# Patient Record
Sex: Female | Born: 1935 | Race: Black or African American | Hispanic: No | Smoking: Never smoker
Health system: Southern US, Community
[De-identification: ages and names within clinical notes are randomized; demographics above are authoritative.]

## PROBLEM LIST (undated history)

## (undated) DIAGNOSIS — E7800 Pure hypercholesterolemia, unspecified: Secondary | ICD-10-CM

## (undated) DIAGNOSIS — E119 Type 2 diabetes mellitus without complications: Secondary | ICD-10-CM

## (undated) DIAGNOSIS — Z87898 Personal history of other specified conditions: Secondary | ICD-10-CM

## (undated) DIAGNOSIS — I10 Essential (primary) hypertension: Secondary | ICD-10-CM

---

## 1995-06-22 HISTORY — PX: BREAST CYST EXCISION: SHX579

## 1995-06-22 HISTORY — PX: ABDOMINAL HYSTERECTOMY: SHX81

## 1997-12-15 ENCOUNTER — Emergency Department (HOSPITAL_COMMUNITY): Admission: EM | Admit: 1997-12-15 | Discharge: 1997-12-15 | Payer: Self-pay | Admitting: Emergency Medicine

## 1999-09-28 ENCOUNTER — Ambulatory Visit (HOSPITAL_COMMUNITY): Admission: RE | Admit: 1999-09-28 | Discharge: 1999-09-28 | Payer: Self-pay | Admitting: Gastroenterology

## 1999-09-28 ENCOUNTER — Encounter: Payer: Self-pay | Admitting: Gastroenterology

## 2001-05-17 ENCOUNTER — Encounter: Payer: Self-pay | Admitting: Emergency Medicine

## 2001-05-17 ENCOUNTER — Emergency Department (HOSPITAL_COMMUNITY): Admission: EM | Admit: 2001-05-17 | Discharge: 2001-05-17 | Payer: Self-pay | Admitting: Emergency Medicine

## 2001-10-05 ENCOUNTER — Ambulatory Visit (HOSPITAL_BASED_OUTPATIENT_CLINIC_OR_DEPARTMENT_OTHER): Admission: RE | Admit: 2001-10-05 | Discharge: 2001-10-05 | Payer: Self-pay | Admitting: *Deleted

## 2001-10-05 ENCOUNTER — Encounter (INDEPENDENT_AMBULATORY_CARE_PROVIDER_SITE_OTHER): Payer: Self-pay | Admitting: Specialist

## 2002-03-01 ENCOUNTER — Ambulatory Visit (HOSPITAL_BASED_OUTPATIENT_CLINIC_OR_DEPARTMENT_OTHER): Admission: RE | Admit: 2002-03-01 | Discharge: 2002-03-01 | Payer: Self-pay | Admitting: Internal Medicine

## 2003-10-25 ENCOUNTER — Emergency Department (HOSPITAL_COMMUNITY): Admission: EM | Admit: 2003-10-25 | Discharge: 2003-10-25 | Payer: Self-pay | Admitting: Family Medicine

## 2004-06-21 DIAGNOSIS — Z87898 Personal history of other specified conditions: Secondary | ICD-10-CM

## 2004-06-21 HISTORY — DX: Personal history of other specified conditions: Z87.898

## 2004-11-17 ENCOUNTER — Emergency Department (HOSPITAL_COMMUNITY): Admission: EM | Admit: 2004-11-17 | Discharge: 2004-11-17 | Payer: Self-pay | Admitting: Emergency Medicine

## 2005-03-22 ENCOUNTER — Inpatient Hospital Stay (HOSPITAL_COMMUNITY): Admission: EM | Admit: 2005-03-22 | Discharge: 2005-03-23 | Payer: Self-pay | Admitting: Emergency Medicine

## 2005-05-27 ENCOUNTER — Emergency Department (HOSPITAL_COMMUNITY): Admission: EM | Admit: 2005-05-27 | Discharge: 2005-05-27 | Payer: Self-pay | Admitting: Emergency Medicine

## 2006-03-08 ENCOUNTER — Emergency Department (HOSPITAL_COMMUNITY): Admission: EM | Admit: 2006-03-08 | Discharge: 2006-03-08 | Payer: Self-pay | Admitting: Family Medicine

## 2006-03-08 IMAGING — CR DG KNEE COMPLETE 4+V*L*
4 series · 4 of 4 positions shown · non-contrast
Comparison: none

CLINICAL DATA: 72-year-old who fell off ladder.  Pain and swelling of knee.  Pain with ambulation laterally.  
 LEFT KNEE ? 3 VIEW:

[view not recorded (1 of 4)]
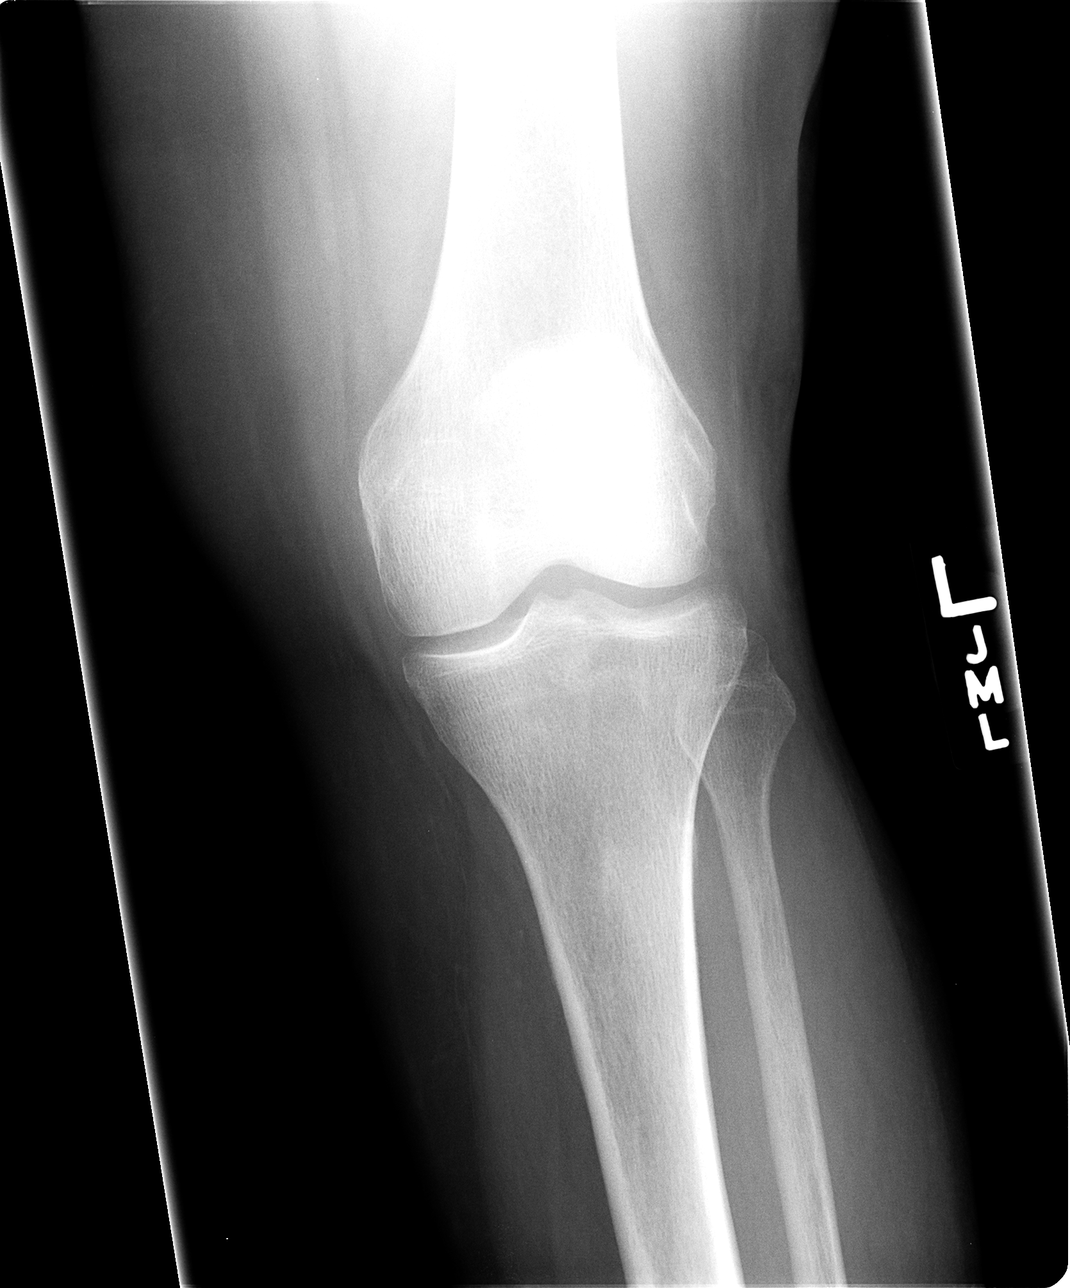

[view not recorded (2 of 4)]
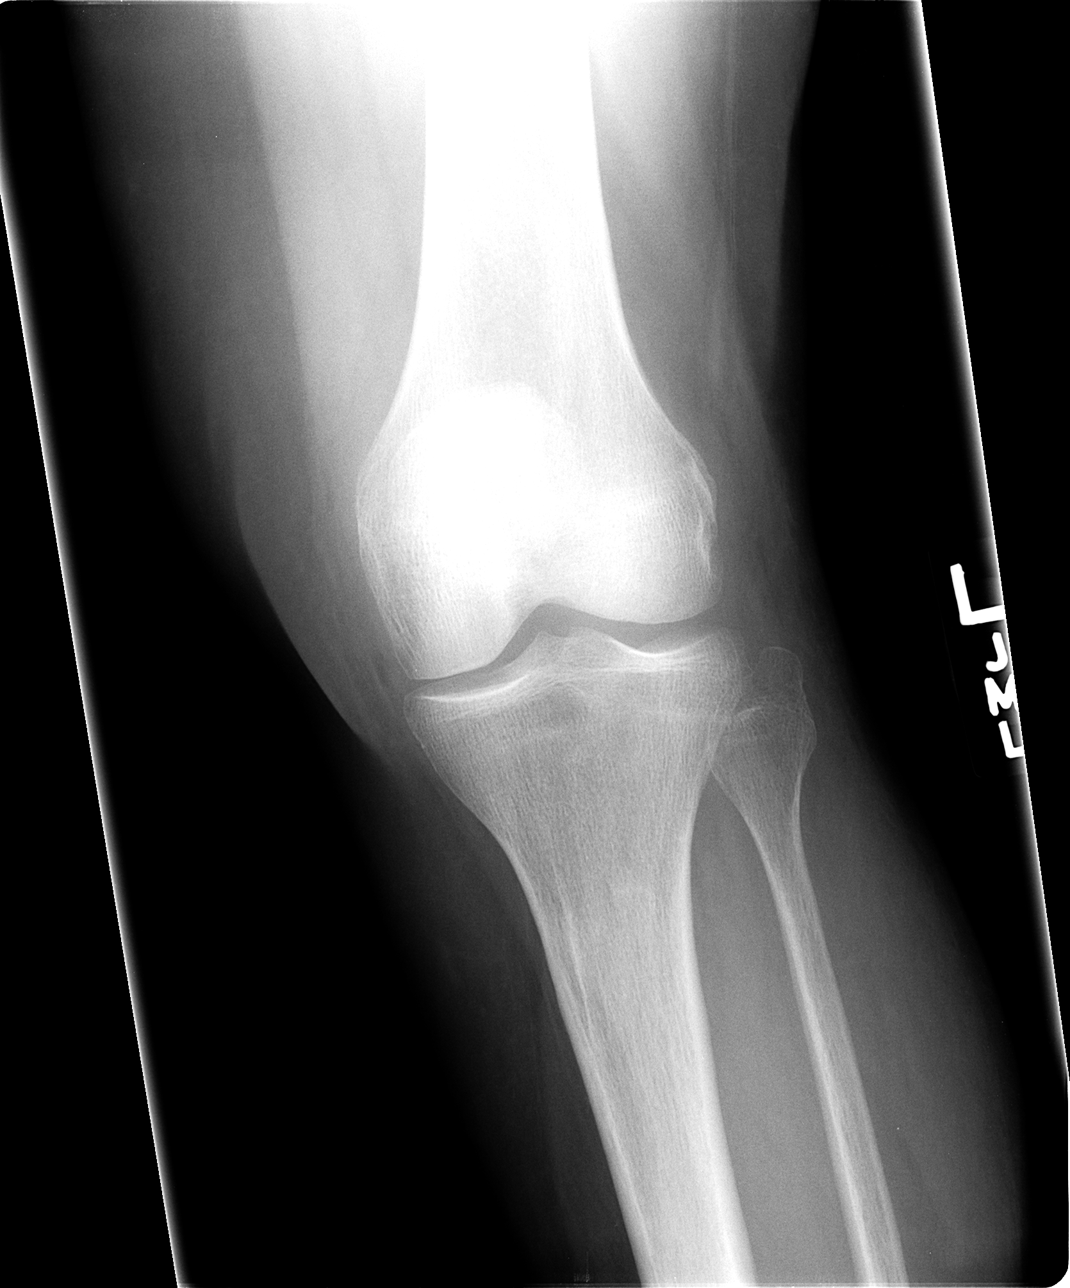

[view not recorded (3 of 4)]
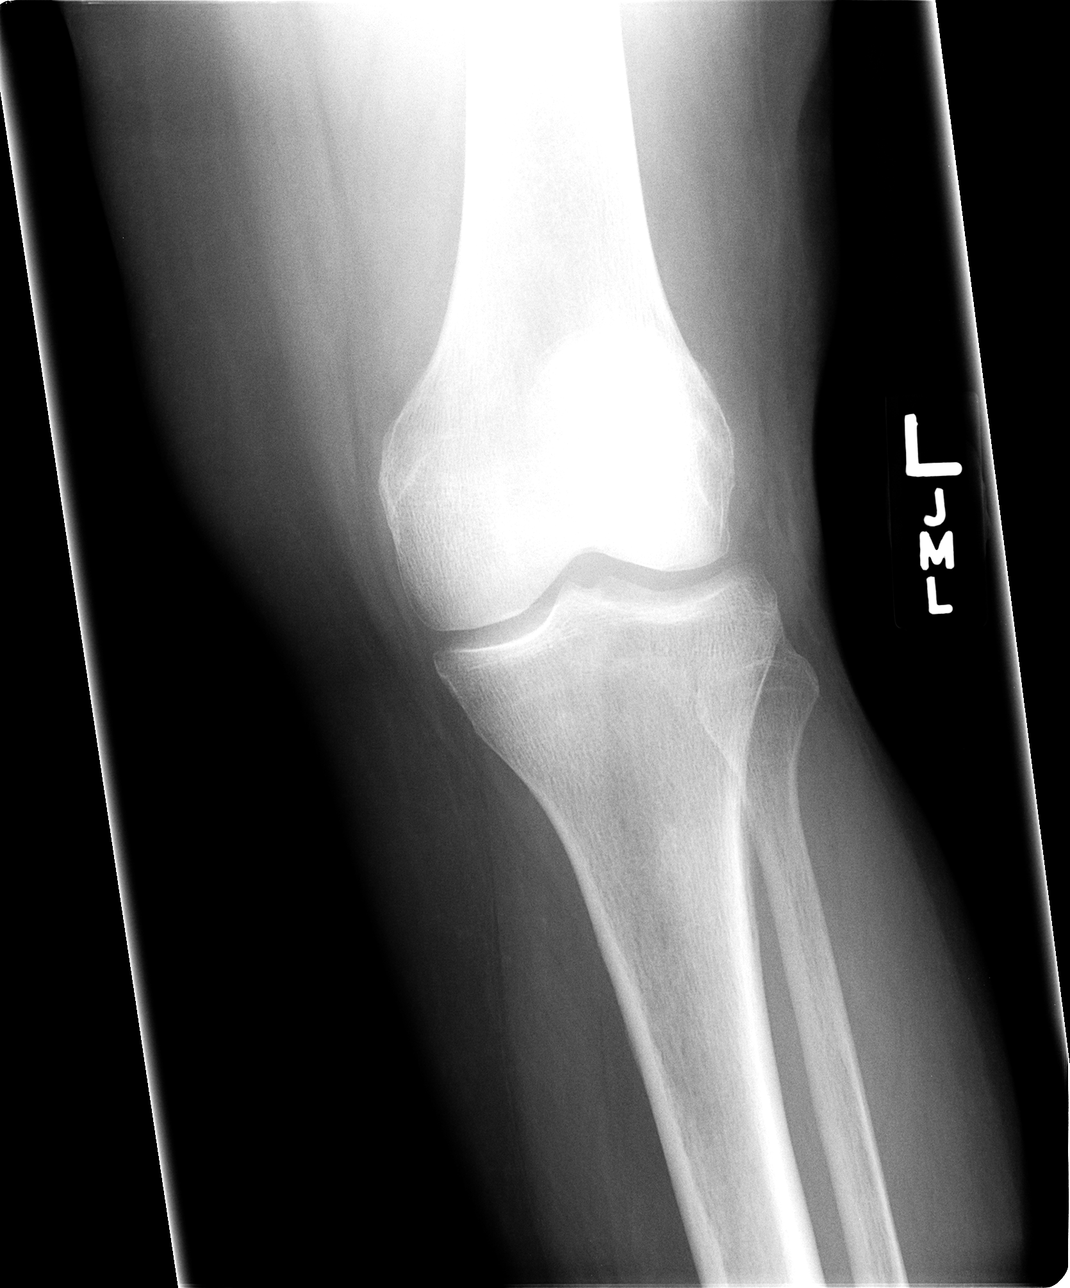

[view not recorded (4 of 4)]
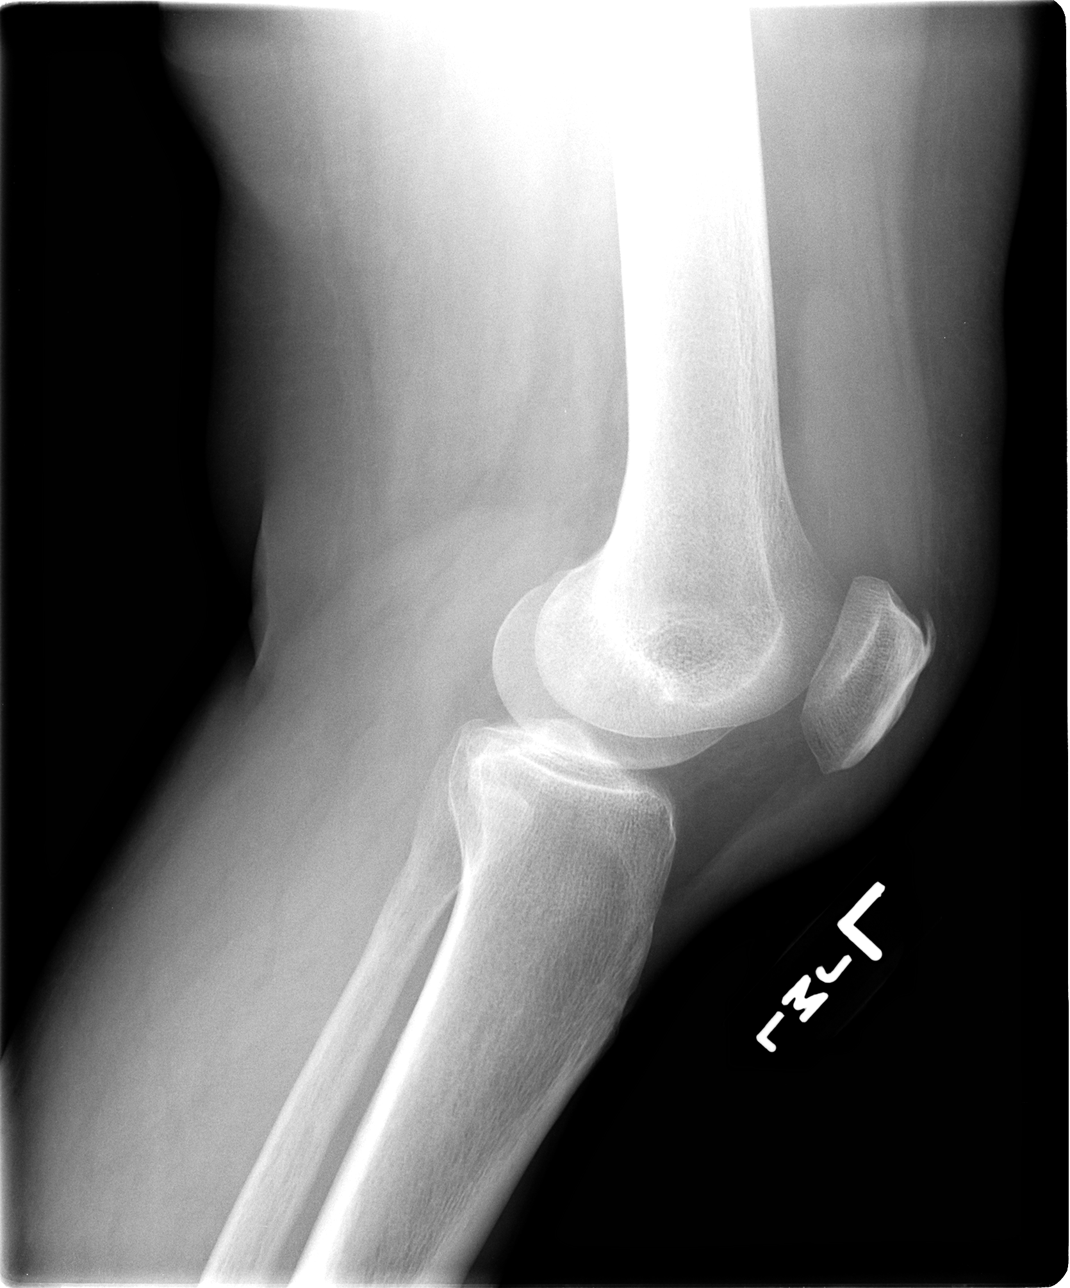

[4 of 4 positions shown; findings below may reference images not displayed]

FINDINGS: Note is made of joint effusion.  There is degenerative change involving the patellofemoral joint and mild narrowing of the medial compartment.  However there is no evidence for acute fracture/dislocation.
IMPRESSION: 1.  Joint effusion without evidence for acute fracture. 
 2.  Mild degenerative changes.  Consider MRI if there is persistent pain.

## 2007-09-25 ENCOUNTER — Emergency Department (HOSPITAL_COMMUNITY): Admission: EM | Admit: 2007-09-25 | Discharge: 2007-09-25 | Payer: Self-pay | Admitting: Emergency Medicine

## 2009-04-09 ENCOUNTER — Ambulatory Visit: Payer: Self-pay | Admitting: Psychology

## 2009-07-18 ENCOUNTER — Ambulatory Visit: Payer: Self-pay | Admitting: Psychology

## 2009-07-27 ENCOUNTER — Emergency Department (HOSPITAL_COMMUNITY): Admission: EM | Admit: 2009-07-27 | Discharge: 2009-07-27 | Payer: Self-pay | Admitting: Family Medicine

## 2010-11-06 NOTE — Discharge Summary (Signed)
NAMEVONITA, Madeline Burke                ACCOUNT NO.:  [id number]   MEDICAL RECORD NO.:  [id number]          PATIENT TYPE:  INP   LOCATION:  2913                         FACILITY:  [facility]   PHYSICIAN:  Hillery Aldo, M.D.   DATE OF BIRTH:  12-08-1933   DATE OF ADMISSION:  03/22/2005  DATE OF DISCHARGE:  03/23/2005                                 DISCHARGE SUMMARY   PRIMARY CARE PHYSICIAN:  Dr. Dorothyann Peng.   ALLERGIST/IMMUNOLOGIST:  [facility] Department of Allergy and Immunology.   DISCHARGE DIAGNOSES:  1.  Recurrent angioedema, trigger unknown.  2.  Hypertension.  3.  Diabetes.  4.  Allergies to sulfa, penicillin, and aspirin.  5.  Hyperlipidemia.   DISCHARGE MEDICATIONS:  1.  Avandia 8 mg daily.  2.  Glipizide 10 mg twice daily.  3.  Crestor 5 mg daily.  4.  Zyrtec 10 mg daily.  5.  Hydrochlorothiazide 25 mg daily.  6.  Prednisone taper 60 mg to off over 5 days.  7.  Metformin 1000 mg twice daily.  8.  Multivitamin daily.   CONSULTATIONS:  Dr. Antony Contras of ENT.   PROCEDURES AND DIAGNOSTIC STUDIES:  None.   DISCHARGE LABORATORY VALUES:  Hemoglobin was 10.5, hematocrit 32.0, white  blood cell count 12.0, platelets 280,000.  Sodium was 134, potassium 3.8,  chloride 103, bicarb 22, BUN 24, creatinine 1.4, glucose 277.   BRIEF ADMISSION HISTORY OF PRESENT ILLNESS:  The patient is a 77 year old  female who came into the emergency department with recurrent angioedema.  The patient reports at least having had 4 episodes in the past, with the  last one occurring in June.  She had seen an allergist/immunologist [facility], but does not recall the physician's  name who treated her.  Apparently, all testing was negative and she has been  treated with Alavert, Zyrtec, and Elavil in the past.  Currently, she  remains only on Zyrtec.  Approximately 1 week prior to her admission, she  developed symptoms of upper respiratory infection and was put  on Levaquin.  She has been taking this for 2 days prior to admission.  She developed a  sensation of a lump in her throat on March 21, 2005 at midnight and was  brought to the emergency department, where she was found to have palatal  angioedema.  She was admitted for further observation and treatment.   HOSPITAL COURSE:  PROBLEM #1 - RECURRENT ANGIOEDEMA:  Unclear what the  trigger is, but Levaquin is strongly suspected.  She was taken off this  medication.  Additionally, her Hyzaar was held.  She was given Solu-Medrol  60 mg IV q.6 h. x24 hours and then put on a prednisone taper, with which she  will go home.  She had no recurrent episodes of angioedema while  hospitalized.  She was evaluated by Dr. Jenne Pane of ENT, who did a laryngoscope  and found edema.  When he followed up with her later on that afternoon, her  airway was clear with no evidence of stridor and decreasing palatal edema.  Overnight, she reported no exacerbation or ongoing problems with swelling,  difficulty breathing or cough.  She was deemed stable for discharge on the  afternoon of March 23, 2005.  The patient has an EpiPen and understands  when to use this.  She did not use it when she experienced her latest  episode.  She states that she has a scheduled followup appointment at the  Department of Allergy and Immunology at [facility].   PROBLEM #2 - HYPERTENSION:  The patient's blood pressures remained fairly  stable off of her Hyzaar.  She will be discharged only on  hydrochlorothiazide with instructions to follow up with her primary care  physician to see if her primary care physician wishes to rechallenge her  with Hyzaar or to attempt to find a different medicine.   PROBLEM #3 - DIABETES:  The patient did have elevated glucoses secondary to  steroids.  She was covered with sliding-scale insulin while hospitalized.  It is expected that her glycemic control will improve once she is completed  with her prednisone  taper.   PROBLEM #4 - HYPERLIPIDEMIA:  The patient will be instructed to resume her  Crestor.   PROBLEM #5 - DISPOSITION:  The patient will be discharged home at  approximately noon this afternoon if she experiences no further problems  with angioedema or palatal swelling.   DISCHARGE INSTRUCTIONS:  The patient is instructed to follow up with Dr.  Allyne Gee in 1-2 weeks for a blood pressure check and to determine if  resumption of Hyzaar is warranted versus considering an alternative  antihypertensive regimen.  Additionally, she is to follow up [facility]  Department of Allergy and Immunology as previously scheduled.  She is to  resume a diabetic diet and to increase her activity slowly.  She was  instructed to her EpiPen with her at all times and to self-administer it  should she have any recurrent angioedema.  She is to return to the emergency  department immediately for any problems with throat swelling.           ______________________________  Hillery Aldo, M.D.     CR/MEDQ  D:  03/23/2005  T:  03/23/2005  Job:  [id number]   cc:   Candyce Churn. Allyne Gee, M.D.  Fax: [phone]

## 2010-11-06 NOTE — H&P (Signed)
NAMELABRESHA, MELLOR                ACCOUNT NO.:  [id number]   MEDICAL RECORD NO.:  [id number]          PATIENT TYPE:  INP   LOCATION:  1843                         FACILITY:  [facility]   PHYSICIAN:  Isidor Holts, M.D.  DATE OF BIRTH:  Jun 09, 1934   DATE OF ADMISSION:  03/22/2005  DATE OF DISCHARGE:                                HISTORY & PHYSICAL   PRIMARY MEDICAL DOCTOR:  Candyce Churn. Allyne Gee, M.D.   CHIEF COMPLAINT:  Swelling in the mouth.   HISTORY OF PRESENT ILLNESS:  This is a 77 year old female, with known  history of recurrent angioedema, at least 4 episodes in 2006, the last one  in June 2006.  She has been worked up by allergist/immunologist, and all  tests are reportedly negative.  She has in the past been treated with  Alavert, Zyrtec, and Elavil, but has now been taken off all these  medications with the exception of Zyrtec.  About one week ago, she developed  upper respiratory tract symptoms, i.e., runny nose, cough, and became  occasionally short of breath since about 4 days ago.  She has taken Levaquin  for this, in the last 2 days.  On March 21, 2005, the patient awoke from  sleep with a feeling of a lump in the throat.  She called her daughter,  who brought her to the emergency department, where she was found to have  palatal angioedema.   PAST MEDICAL HISTORY:  1.  Diabetes mellitus type 2.  2.  Hypertension.  3.  Recurrent angioedema, 2006.  4.  Status post hysterectomy/status post removal of right breast cyst, 1997.   MEDICATIONS:  1.  Zyrtec 10 mg p.o. daily.  2.  Avandia 8 mg p.o. daily.  3.  Glipizide ER 10 mg b.i.d.  4.  Metformin 1 g b.i.d.  5.  Hyzaar (100/25) 1 p.o. daily.  6.  Centrum A to Z 1 p.o. daily.   ALLERGIES:  1.  SULFA.  2.  PENICILLIN.  3.  ASPIRIN.   SYSTEMS REVIEW:  As per HPI and chief complaint, otherwise negative.   FAMILY/SOCIAL HISTORY:  The patient lives alone.  Is a nonsmoker,  nondrinker.  Has no history of drug abuse.   She has three offspring.  Her  mother has diabetes mellitus, hypertension, and osteoarthritis.  Her father  died of complications of diabetes mellitus, status post CVA.   PHYSICAL EXAMINATION:  VITAL SIGNS:  Temperature 98.7; pulse 95 per minute,  regular; respiratory rate 22; BP 140/78 mmHg; pulse oximetry 100% on room  air.  GENERAL:  The patient appears alert, comfortable, not short of breath at  rest, although she sounds hoarse.  HEENT:  No clinical pallor, no jaundice, no conjunctival injection.  Throat  examination reveals mild palatal swelling.  NECK:  Supple.  JVD not seen.  There is no palpable lymphadenopathy, no  palpable goiter.  CHEST:  Clinically clear to auscultation.  No wheezes, no crackles.  HEART:  Sounds 1 and 2 heard.  Normal rate and rhythm.  No murmurs.  ABDOMEN:  Full, soft, and nontender.  There is  no palpable organomegaly, no  palpable masses, normal bowel sounds.  LOWER EXTREMITIES:  No pitting edema.  Palpable peripheral pulses.  MUSCULOSKELETAL:  No abnormalities noted.  CENTRAL NERVOUS SYSTEM:  No focal neurologic deficits on gross examination.   INVESTIGATIONS:  Pending.   ASSESSMENT AND PLAN:  1.  Recurrent angioedema/Dysphonia..  The patient has been on her current      medications for some time now.  The only recent addition is Levaquin.      The patient had a recent URI, without production of purulent phlegm.      This is likely viral in etiology.  We shall therefore discontinue      Levaquin.  We shall also discontinue Hyzaar, as this may be a likely      culprit.  Treat patient with antihistamines, steroids, and H2RA.  We      shall admit for observation to the step-down unit, or ICU, for close      observation, in case respiratory distress supervenes. ENT has been      consulted.   1.  Hypertension.  BP appears reasonable.  Will monitor for now.   1.  Diabetes mellitus.  Will restart pre-admission medications, when it is      clear that the  patient will be able to swallow.  Meanwhile, will monitor      CBGs regularly and cover with sliding scale insulin.  Further management will depend on clinical course.      Isidor Holts, M.D.  Electronically Signed     CO/MEDQ  D:  03/22/2005  T:  03/22/2005  Job:  [id number]   cc:   Candyce Churn. Allyne Gee, M.D.  Fax: [phone]

## 2010-11-06 NOTE — Op Note (Signed)
[place]  Patient:    Madeline Burke, Madeline Burke Visit Number: [id number] MRN: [id number]          Service Type: DSU Location: [facility] Attending Physician:  Vikki Ports. Dictated by:   Earna Coder, M.D. Proc. Date: 10/05/01 Admit Date:  10/05/2001   CC:         Tyson Dense, M.D.   Operative Report  PREOPERATIVE DIAGNOSIS:  Rule out polymyositis.  POSTOPERATIVE DIAGNOSIS:  Rule out polymyositis.  OPERATION PERFORMED:  Left quadriceps muscle biopsy.  SURGEON:  Stephenie Acres, M.D.  ANESTHESIA:  General.  DESCRIPTION OF PROCEDURE:  The patient was taken to the operating room and placed in supine position.  After adequate anesthesia was induced, the left thigh was prepped and draped in normal sterile fashion.  Using a vertical incision over the lower lateral thigh, I dissected down onto the fascia.  The fascia was opened vertically for about 2 cm and longitudinal muscle fibers were delivered up and through the fascia.  They were ligated on the ends, kept on stretch and tacked to a 2 cm strip of Q-Tip.  This was sent for pathological evaluation after discussion with Dr. Clelia Croft.  Adequate hemostasis was ensured and the fascia was closed with a running  3-0 Vicryl suture.  Skin was closed with a subcuticular 4-0 Monocryl.  Steri-Strips and sterile dressing were applied.  The patient tolerated the procedure well and went to PACU in good condition. Dictated by:   Earna Coder, M.D. Attending Physician:  Danna Hefty R. DD:  10/05/01 TD:  10/05/01 Job: [id number]

## 2010-11-06 NOTE — Consult Note (Signed)
NAMEARELIS, Burke NO.:  [id number]   MEDICAL RECORD NO.:  [id number]          PATIENT TYPE:  INP   LOCATION:  1843                         FACILITY:  [facility]   PHYSICIAN:  Antony Contras, MD     DATE OF BIRTH:  1933-09-12   DATE OF CONSULTATION:  03/22/2005  DATE OF DISCHARGE:                                   CONSULTATION   REFERRING SERVICE:  [facility].   CHIEF COMPLAINT:  Swelling in throat.   HISTORY OF PRESENT ILLNESS:  [name] is a 77 year old African  American female who presented to the emergency room last night with a  feeling of swelling in her throat.  Madeline Burke has a history of recurrent  angioedema and has been evaluated by allergists and immunologists in  [place] as well at [facility]; the etiology remains unknown.  She has recently been treated only with Zyrtec.  Last night, she began  experiencing a feeling of a knot in her throat and came to the emergency  department around midnight due to this sensation.  Here at the emergency  department, she has been without respiratory distress and upon evaluation by  emergency staff, she was noted to have swelling of her uvula and soft  palate; a consultation was obtained for that reason.  Currently, Mrs.  Madeline Burke is tired from being awake all night and also feels a swelling in the  back of the throat.  Her voice is somewhat hoarse and she states that she  has had laryngitis for the past 3 or 4 days.  She is having no trouble  breathing and has not tried to swallow anything through the night.   PAST MEDICAL HISTORY:  1.  Non-insulin-dependent diabetes.  2.  Hypertension.   SOCIAL HISTORY:  The patient denies alcohol or tobacco use.   MEDICATIONS:  1.  Glucophage.  2.  Avandia.  3.  Hyzaar.  4.  Glucotrol.   ALLERGIES:  PENICILLIN, SULFA, ASPIRIN.   FAMILY HISTORY:  Noncontributory.   REVIEW OF SYSTEMS:  Review of systems otherwise negative.   PHYSICAL  EXAMINATION:  VITAL SIGNS:  Temperature 98.7 degrees, blood  pressure 144/78, pulse of 75, respirations 22, SAT 94% to 100% on room air.  GENERAL:  Madeline Burke is an age-appropriate African American who is  currently in no acute distress.  She is pleasant and cooperative.  Her voice  is slightly scratchy.  HEENT:  Oral cavity and oropharynx exam reveals a normal-sized tongue and  normal floor of the mouth.  She wears upper and lower dentures that were  removed.  Hard palate is normal, as are the lips.  The soft palate and uvula  are edematous through the oropharynx, making the posterior pharyngeal wall  difficult to see.  Nasal exam is unremarkable with anterior nasal exam  revealing patent nasal passages.  Ear exam reveals normal tympanic membranes  and external auditory canals.  NECK:  Exam reveals normal neck landmarks with no lymphadenopathy or mass.  She is nontender in the neck.   PROCEDURE:  The left  nasal passage was sprayed with Afrin and dripped with  1% lidocaine.  A fiberoptic laryngoscope was then passed through the left  nasal passage to view the pharynx and larynx.  Findings included marked  edema of the uvula from above.  The nasal airway is normal.  Evaluation of  the pharynx and larynx reveals edema of the right aryepiglottic fold with  obliteration of the piriform sinus on the right side.  The endolarynx  appears to be relatively free, although the aryepiglottic fold and arytenoid  edema on the right side do hood the larynx with closure of the larynx.  The  airway is otherwise patent and vocal cord and falciform cords appear normal.  The entire left side is normal as well.   ASSESSMENT:  Madeline Burke is a 77 year old African American female with  a history of recurrent angioedema with a current episode that is involving  the soft palate and uvula as well as right aryepiglottic fold and arytenoid  region.   PLAN:  The patient is being admitted to the hospitalist  service and I have  recommended that she be placed in intensive care for close evaluation.  A  tracheostomy tray will be placed at bedside.  She will also be treated with  Benadryl, an H2 blocker, and Solu-Medrol.  She was given a dose of Decadron  in the emergency department.  I will follow her airway closely and we will  reevaluate her midday today.  She may require another fiberoptic exam if her  soft palate has not resolved.  As for followup, I have suggested to the  patient's family that they call the allergy/immunology department at [facility] to report this episode and to seek followup accordingly.  She is  scheduled for a December followup there as it is.           ______________________________  Antony Contras, MD     DDB/MEDQ  D:  03/22/2005  T:  03/22/2005  Job:  [id number]

## 2011-03-16 LAB — CBC
HCT: 37.8
Hemoglobin: 12.3
MCHC: 32.5
MCV: 82.9
Platelets: 246
RBC: 4.57
RDW: 14.5
WBC: 7.1

## 2011-03-16 LAB — COMPREHENSIVE METABOLIC PANEL
ALT: 23
AST: 25
Albumin: 3.9
Alkaline Phosphatase: 65
BUN: 16
CO2: 29
Calcium: 9.1
Chloride: 103
Creatinine, Ser: 1.01
GFR calc Af Amer: >60
GFR calc non Af Amer: 54 — ABNORMAL LOW
Glucose, Bld: 205 — ABNORMAL HIGH
Potassium: 3.6
Sodium: 139
Total Bilirubin: 0.6
Total Protein: 6.5

## 2011-03-16 LAB — URINALYSIS, ROUTINE W REFLEX MICROSCOPIC
Glucose, UA: NEGATIVE
Ketones, ur: NEGATIVE
Specific Gravity, Urine: 1.006
pH: 7

## 2011-03-16 LAB — DIFFERENTIAL
Eosinophils Relative: 1
Lymphocytes Relative: 26
Lymphs Abs: 1.8
Monocytes Relative: 7

## 2012-04-30 ENCOUNTER — Emergency Department (HOSPITAL_COMMUNITY)
Admission: EM | Admit: 2012-04-30 | Discharge: 2012-04-30 | Disposition: A | Discharge status: Home / Self Care | Payer: Medicare Other | Source: Home / Self Care | Attending: Emergency Medicine | Admitting: Emergency Medicine

## 2012-04-30 ENCOUNTER — Encounter (HOSPITAL_COMMUNITY): Payer: Self-pay | Admitting: Emergency Medicine

## 2012-04-30 DIAGNOSIS — T6391XA Toxic effect of contact with unspecified venomous animal, accidental (unintentional), initial encounter: Secondary | ICD-10-CM

## 2012-04-30 DIAGNOSIS — T63441A Toxic effect of venom of bees, accidental (unintentional), initial encounter: Secondary | ICD-10-CM

## 2012-04-30 DIAGNOSIS — T63461A Toxic effect of venom of wasps, accidental (unintentional), initial encounter: Secondary | ICD-10-CM

## 2012-04-30 HISTORY — DX: Essential (primary) hypertension: I10

## 2012-04-30 HISTORY — DX: Pure hypercholesterolemia, unspecified: E78.00

## 2012-04-30 HISTORY — DX: Type 2 diabetes mellitus without complications: E11.9

## 2012-04-30 MED ORDER — DIPHENHYDRAMINE HCL 25 MG PO TABS: 25.0000 mg | ORAL_TABLET | Freq: Three times a day (TID) | ORAL | Status: DC | PRN

## 2012-04-30 MED ORDER — PREDNISONE 20 MG PO TABS: 20.0000 mg | ORAL_TABLET | Freq: Every day | ORAL | Status: AC

## 2012-04-30 MED ORDER — PREDNISONE 20 MG PO TABS: 20.0000 mg | ORAL_TABLET | Freq: Every day | ORAL | Status: DC

## 2012-04-30 NOTE — ED Provider Notes (Signed)
History     CSN: [id number]  Arrival date & time 04/30/12  1443   First MD Initiated Contact with Patient 04/30/12 1444      Chief Complaint  Patient presents with  . Insect Bite    bee sting to index finger of right hand around 2 pm today. red swollen itchy and tightness.     (Consider location/radiation/quality/duration/timing/severity/associated sxs/prior treatment) HPI Comments: Pt states stung by yellow jacket around 2 p.m today index finger of right hand. Finger is red/swollen/and feels tight. . She was stung by a bee on the dorsal aspect of her right hand. Immediately she started experiencing a burning sensation in her hand became red and swollen she took one of her hydroxyzine says she has taken in the past as prescribed for her Dr. for swelling the patient says ". Patient denies any shortness of breath, difficulty swallowing, feeling dizzy, facial swelling, does describe a bit of a rash covering her neck and her face does seem to have clear some by the time she got to urgent care. It's been about an hour and a half since the bee stung her.  The history is provided by the patient.    Past Medical History  Diagnosis Date  . Diabetes mellitus without complication   . Hypertension   . Elevated cholesterol     Past Surgical History  Procedure Date  . Abdominal hysterectomy     Family History  Problem Relation Age of Onset  . Diabetes Other   . Hyperlipidemia Other   . Hypertension Other     History  Substance Use Topics  . Smoking status: Never Smoker   . Smokeless tobacco: Not on file  . Alcohol Use: No    OB History    Grav Para Term Preterm Abortions TAB SAB Ect Mult Living                  Review of Systems  Constitutional: Negative for fever, chills, activity change, appetite change and fatigue.  Respiratory: Negative for shortness of breath.   Gastrointestinal: Negative for rectal pain.  Skin: Positive for rash. Negative for color change and pallor.   Neurological: Negative for dizziness and headaches.    Allergies  Aspirin; Penicillins; and Sulfa antibiotics  Home Medications   Current Outpatient Rx  Name  Route  Sig  Dispense  Refill  . AMLODIPINE BESYLATE 5 MG PO TABS   Oral   Take 5 mg by mouth daily.         Marland Kitchen VITAMIN D 1000 UNITS PO CAPS   Oral   Take 1,000 Units by mouth daily.         . CYCLOSPORINE 0.05 % OP EMUL      1 drop 2 (two) times daily.         Marland Kitchen DIPHENHYDRAMINE HCL 25 MG PO TABS   Oral   Take 1 tablet (25 mg total) by mouth every 8 (eight) hours as needed for itching.   20 tablet   0   . MEMANTINE HCL 10 MG PO TABS   Oral   Take 10 mg by mouth 2 (two) times daily.         Marland Kitchen PIOGLITAZONE HCL-METFORMIN HCL 15-850 MG PO TABS   Oral   Take 1 tablet by mouth 2 (two) times daily with a meal.         . PREDNISONE 20 MG PO TABS   Oral   Take 1 tablet (20 mg total)  by mouth daily. 2 tablets daily for 5 days   10 tablet   0   . ROSUVASTATIN CALCIUM 10 MG PO TABS   Oral   Take 10 mg by mouth daily.           BP 132/51  Pulse 62  Temp 98.9 F (37.2 C) (Oral)  Resp 16  SpO2 95%  Physical Exam  Constitutional: Vital signs are normal. She appears well-developed and well-nourished.  Non-toxic appearance. She does not have a sickly appearance. She does not appear ill. No distress.  HENT:  Head: Normocephalic.  Mouth/Throat: Uvula is midline and mucous membranes are normal. No oropharyngeal exudate.  Eyes: Conjunctivae normal are normal. No scleral icterus.  Neck: Neck supple. No JVD present.  Pulmonary/Chest: Effort normal and breath sounds normal. She has no decreased breath sounds.  Musculoskeletal: Normal range of motion.  Neurological: She is alert.  Skin: Skin is warm. Rash noted. There is erythema.       ED Course  Procedures (including critical care time)  Labs Reviewed - No data to display No results found.   1. Bee sting reaction        MDM   Localized  reaction, most consistent with a allergenic focal hypersensitivity reaction. Patient had no respiratory symptoms or mucosa involvement. Was unable to observe any tachycardia/hives on her torso or back or upper extremities. Patient was in no respiratory distress, and had no difficulty swallowing. Patient was somewhat argumentative and very hesitant to use any steroids, she initially declined any prescription or use of prednisone while at [facility]. We have agreed upon a prescription in case the swelling and the itchiness does not improve in the next 24 hours she will commit to a short low dose of steroids or she agrees to do so. She describes to me that her primary care Dr. told her that she could never used prednisone.( Patient interprets as she has a tendency to swelling")  Patient has a followup appointment already scheduled to see her provider tomorrow. Following measures were recommended #1 call therapy to be applied on her hands for 10-20 minutes at a time in the next 48 hours #2 to keep her right hand elevated as much as possible above her heart level. #3 to use Benadryl every 8 hours for the next 2-3 days and advised to B. precautions about potential drowsiness or dizziness. #4 discuss in detail symptoms that should warrant for further evaluation and return to the medical facility or the emergency department.        Jimmie Molly, MD 04/30/12 Rickey Primus

## 2012-04-30 NOTE — ED Notes (Signed)
Pt states stung by yellow jacket around 2 p.m today index finger of right hand. Finger is red/swollen/and feels tight. Hives covering body. Pt took hydoxyzine with no relief. Pt sitting up right no signs of distress.

## 2013-04-06 HISTORY — PX: VENOUS ABLATION: SHX2656

## 2014-05-22 ENCOUNTER — Encounter (HOSPITAL_COMMUNITY): Payer: Self-pay | Admitting: Emergency Medicine

## 2014-05-22 ENCOUNTER — Emergency Department (HOSPITAL_COMMUNITY): Payer: Medicare Other

## 2014-05-22 ENCOUNTER — Emergency Department (HOSPITAL_COMMUNITY)
Admission: EM | Admit: 2014-05-22 | Discharge: 2014-05-22 | Disposition: A | Discharge status: Home / Self Care | Payer: Medicare Other | Attending: Emergency Medicine | Admitting: Emergency Medicine

## 2014-05-22 DIAGNOSIS — Y9389 Activity, other specified: Secondary | ICD-10-CM | POA: Insufficient documentation

## 2014-05-22 DIAGNOSIS — W101XXA Fall (on)(from) sidewalk curb, initial encounter: Secondary | ICD-10-CM | POA: Insufficient documentation

## 2014-05-22 DIAGNOSIS — E119 Type 2 diabetes mellitus without complications: Secondary | ICD-10-CM | POA: Insufficient documentation

## 2014-05-22 DIAGNOSIS — W19XXXA Unspecified fall, initial encounter: Secondary | ICD-10-CM

## 2014-05-22 DIAGNOSIS — Z79899 Other long term (current) drug therapy: Secondary | ICD-10-CM | POA: Diagnosis not present

## 2014-05-22 DIAGNOSIS — I10 Essential (primary) hypertension: Secondary | ICD-10-CM | POA: Insufficient documentation

## 2014-05-22 DIAGNOSIS — Z880 Allergy status to penicillin: Secondary | ICD-10-CM | POA: Diagnosis not present

## 2014-05-22 DIAGNOSIS — Y998 Other external cause status: Secondary | ICD-10-CM | POA: Diagnosis not present

## 2014-05-22 DIAGNOSIS — E780 Pure hypercholesterolemia: Secondary | ICD-10-CM | POA: Insufficient documentation

## 2014-05-22 DIAGNOSIS — Y9289 Other specified places as the place of occurrence of the external cause: Secondary | ICD-10-CM | POA: Insufficient documentation

## 2014-05-22 DIAGNOSIS — S0181XA Laceration without foreign body of other part of head, initial encounter: Secondary | ICD-10-CM | POA: Diagnosis not present

## 2014-05-22 DIAGNOSIS — S0993XA Unspecified injury of face, initial encounter: Secondary | ICD-10-CM | POA: Diagnosis present

## 2014-05-22 HISTORY — DX: Personal history of other specified conditions: Z87.898

## 2014-05-22 IMAGING — CT CT MAXILLOFACIAL W/O CM
3 of 4 series · 15 of 47 positions shown, 18 images · non-contrast
Comparison: None.

CLINICAL DATA: Status post fall this morning with a blow to the
chin. Initial encounter.

EXAM:
CT MAXILLOFACIAL WITHOUT CONTRAST
TECHNIQUE: Multidetector CT imaging of the maxillofacial structures was
performed. Multiplanar CT image reconstructions were also generated.
A small metallic BB was placed on the right temple in order to
reliably differentiate right from left.

[Series 4: facial st · axial · 0.34mm/px · z∈[+1366,+1492]mm · 10 of 75 slices shown, 13 images]
[im 6/75  brain]
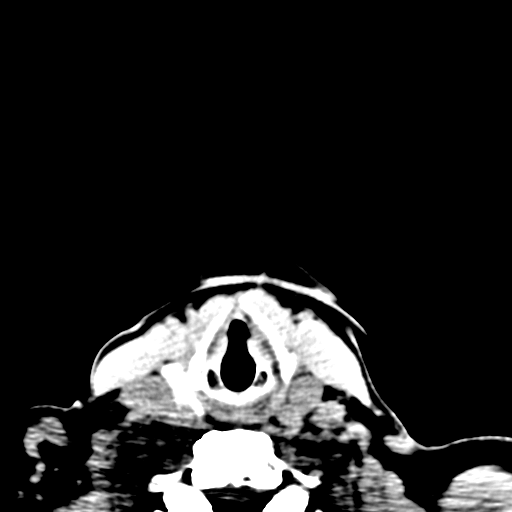
[im 6/75  bone]
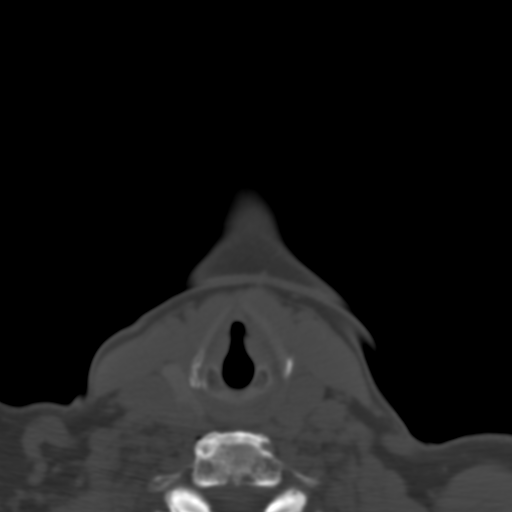
[im 13/75  bone]
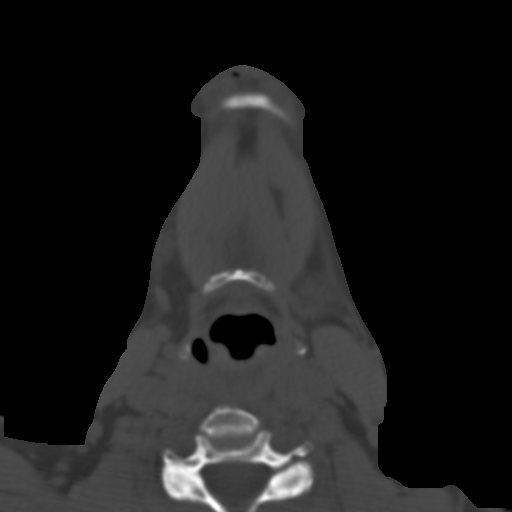
[im 21/75  bone]
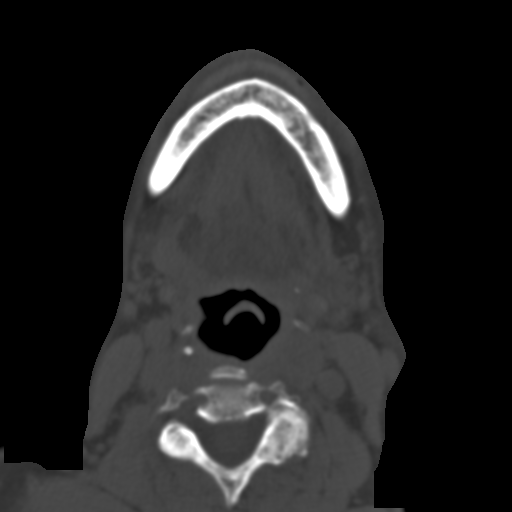
[im 26/75  bone]
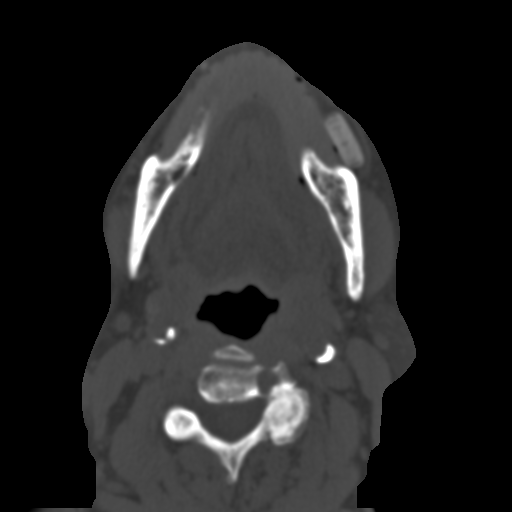
[im 34/75  brain]
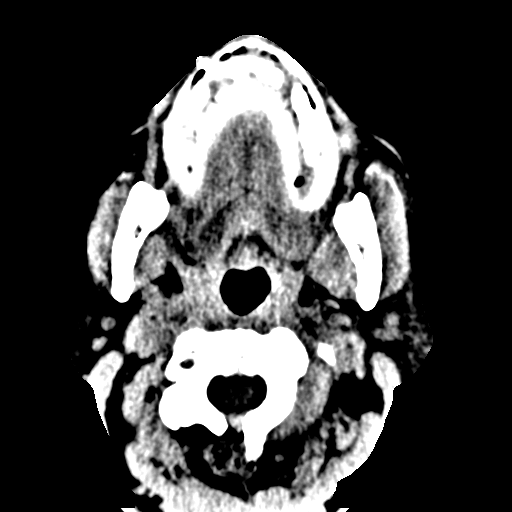
[im 34/75  bone]
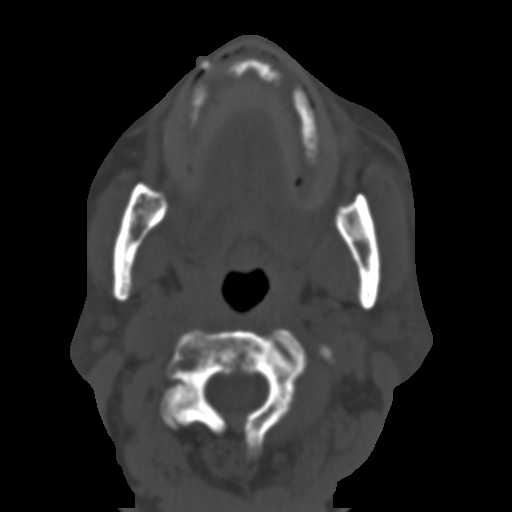
[im 41/75  bone]
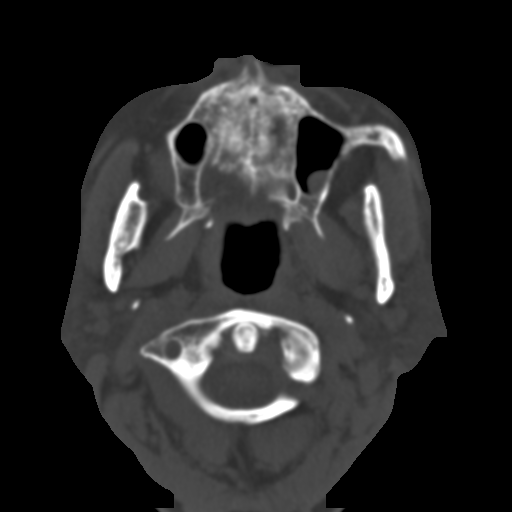
[im 49/75  bone]
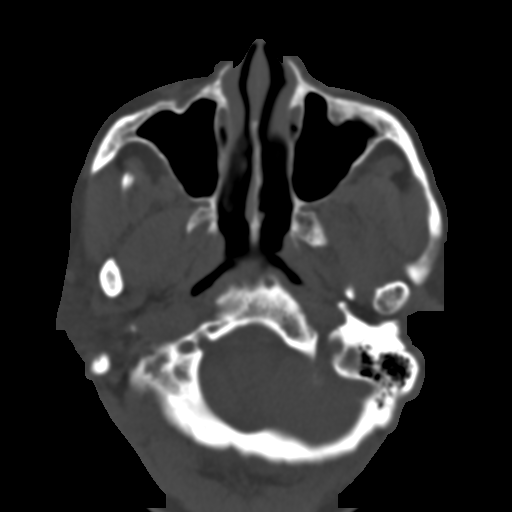
[im 57/75  bone]
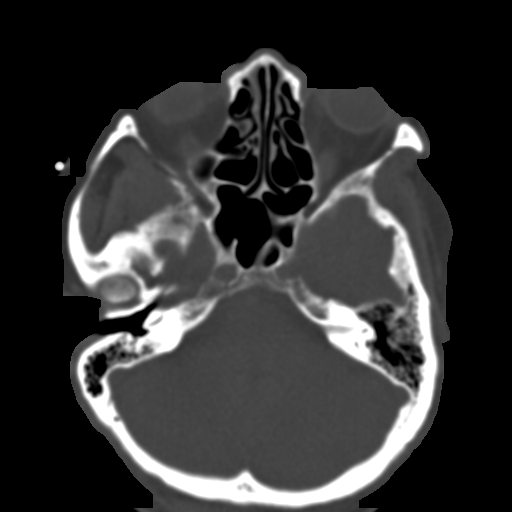
[im 62/75  brain]
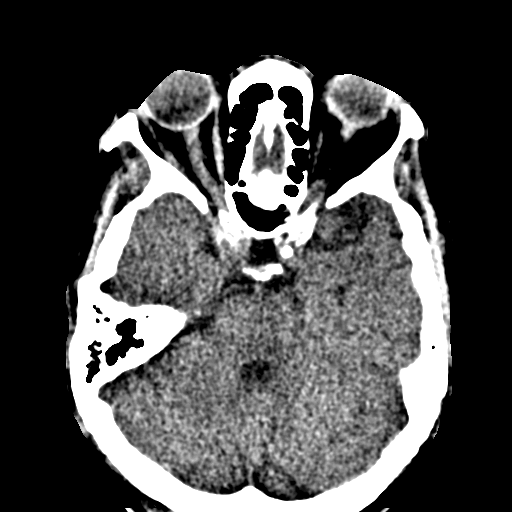
[im 62/75  bone]
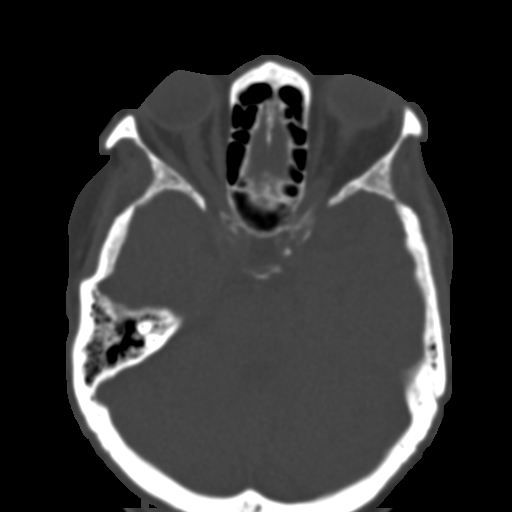
[im 69/75  bone]
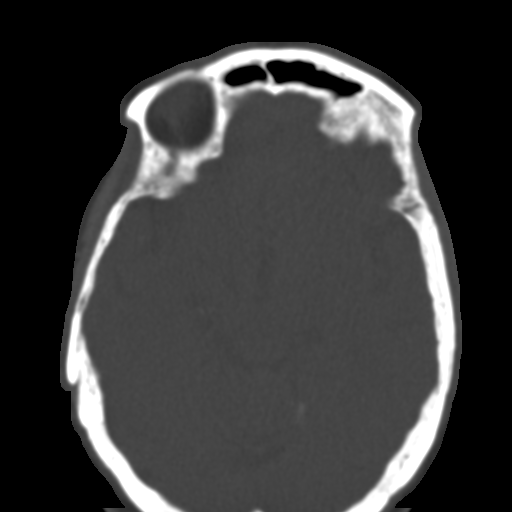

[Series 6: coronal st · coronal · 0.28mm/px · 3 of 84 slices shown]
[im 28/84  bone]
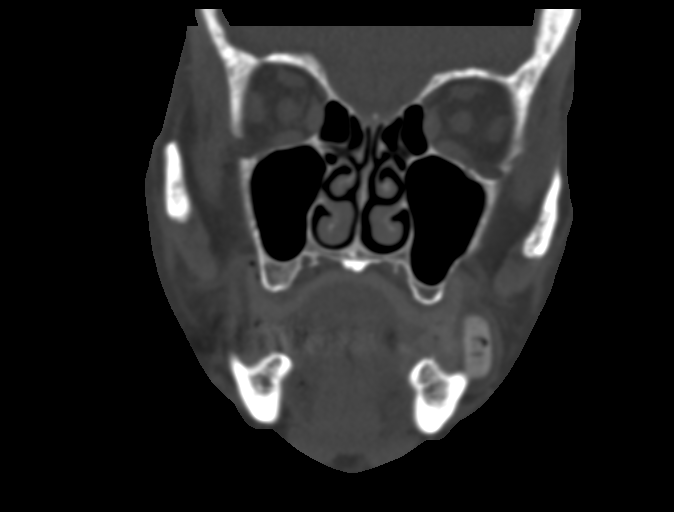
[im 37/84  bone]
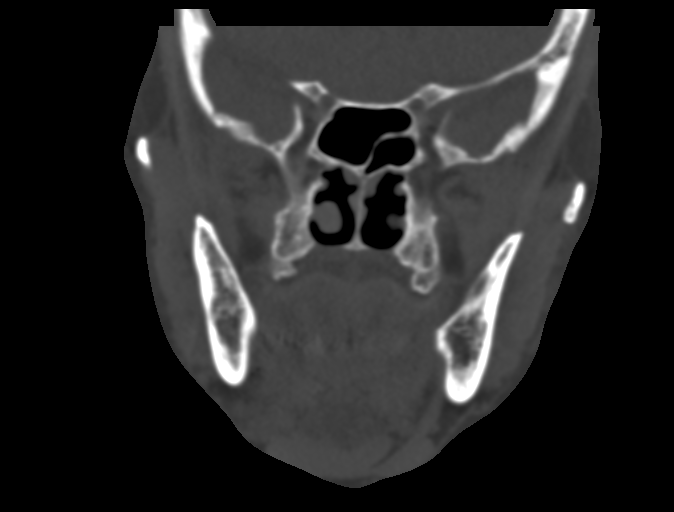
[im 47/84  bone]
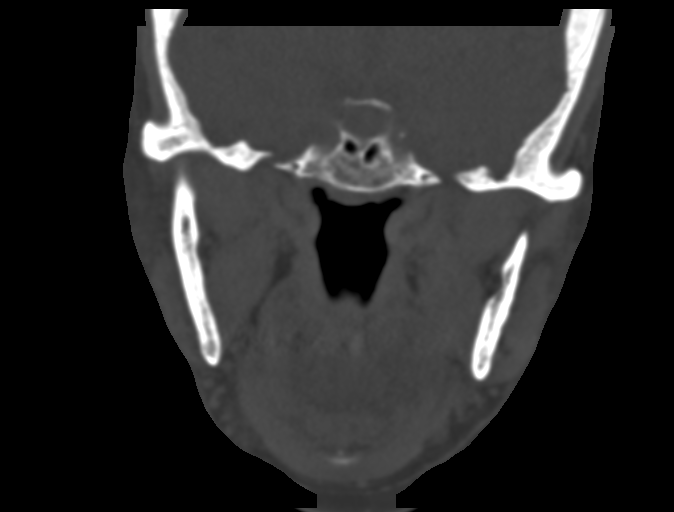

[Series 9: sagittal bone · sagittal · 0.32mm/px · 2 of 85 slices shown]
[im 29/85  bone]
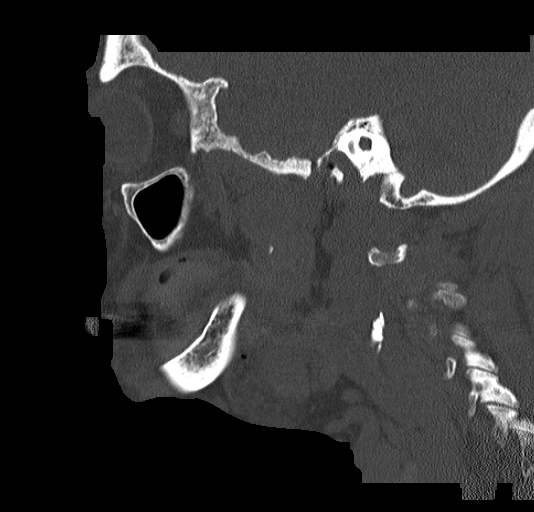
[im 57/85  bone]
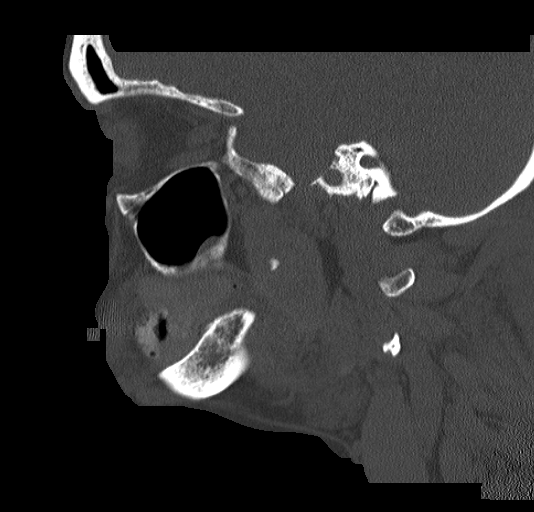

[15 of 47 positions shown; findings below may reference images not displayed]

FINDINGS: Subcutaneous air is seen along the anterior mandible consistent with
laceration. There is an implant along the body of the left mandible.
No fracture is identified. The mandibular condyles are located. Very
small mucous retention cyst or polyp left maxillary sinus is
identified. Paranasal sinuses and mastoid air cells are otherwise
clear. No acute abnormality of the upper cervical spine is
identified. Imaged intracranial contents appear normal.
IMPRESSION: Laceration without facial bone fracture.

## 2014-05-22 MED ORDER — LIDOCAINE-EPINEPHRINE 2 %-1:100000 IJ SOLN
20.0000 mL | Freq: Once | INTRAMUSCULAR | Status: AC
  Administered 2014-05-22: 20 mL

## 2014-05-22 NOTE — ED Provider Notes (Signed)
CSN: [id number]     Arrival date & time 05/22/14  2575 History   First MD Initiated Contact with Patient 05/22/14 1012     Chief Complaint  Patient presents with  . Fall  . Facial Injury     (Consider location/radiation/quality/duration/timing/severity/associated sxs/prior Treatment) HPI  Patient to the ED from the [facility] after a fall this morning. She tripped on a curb after working out and his her chin on the concrete. She denies loc, headache, or neck pain. She put a band-aid on it but the cut wouldn't stop bleeding therefore her friends and family encouraged her to go to the ED. She denies having any pain or headache. Per friend that is present she is acting at baseline. Denies being on blood thinners.  Past Medical History  Diagnosis Date  . Diabetes mellitus without complication   . Hypertension   . Elevated cholesterol   . History of angioedema 2006   Past Surgical History  Procedure Laterality Date  . Breast cyst excision Right 1997  . Abdominal hysterectomy  1997   Family History  Problem Relation Age of Onset  . Diabetes Other   . Hyperlipidemia Other   . Hypertension Other    History  Substance Use Topics  . Smoking status: Never Smoker   . Smokeless tobacco: Not on file  . Alcohol Use: No   OB History    No data available     Review of Systems  10 Systems reviewed and are negative for acute change except as noted in the HPI.    Allergies  Aspirin; Penicillins; and Sulfa antibiotics  Home Medications   Prior to Admission medications   Medication Sig Start Date End Date Taking? Authorizing Provider  amLODipine (NORVASC) 5 MG tablet Take 5 mg by mouth daily.    Historical Provider, MD  Cholecalciferol (VITAMIN D) 1000 UNITS capsule Take 1,000 Units by mouth daily.    Historical Provider, MD  cycloSPORINE (RESTASIS) 0.05 % ophthalmic emulsion 1 drop 2 (two) times daily.    Historical Provider, MD  diphenhydrAMINE (BENADRYL) 25 MG tablet Take 1 tablet  (25 mg total) by mouth every 8 (eight) hours as needed for itching. 04/30/12 05/03/12  Rosana Hoes, MD  memantine (NAMENDA) 10 MG tablet Take 10 mg by mouth 2 (two) times daily.    Historical Provider, MD  pioglitazone-metformin (ACTOPLUS MET) 15-850 MG per tablet Take 1 tablet by mouth 2 (two) times daily with a meal.    Historical Provider, MD  rosuvastatin (CRESTOR) 10 MG tablet Take 10 mg by mouth daily.    Historical Provider, MD   BP 159/69 mmHg  Pulse 69  Temp(Src) 98.4 F (36.9 C) (Oral)  Resp 18  SpO2 100% Physical Exam  Constitutional: She appears well-developed and well-nourished. No distress.  HENT:  Head: Normocephalic and atraumatic.  Eyes: Pupils are equal, round, and reactive to light.  Neck: Normal range of motion. Neck supple.    Cardiovascular: Normal rate and regular rhythm.   Pulmonary/Chest: Effort normal.  Abdominal: Soft.  Neurological: She is alert.  Cranial nerves II-VIII and X-XII evaluated and show no deficits. Pt alert and oriented x 3 Upper and lower extremity strength is symmetrical and physiologic Normal muscular tone No facial droop Coordination intact, no limb ataxia, finger-nose-finger normal Rapid alternating movements normal No pronator drift   Skin: Skin is warm and dry.  Nursing note and vitals reviewed.     ED Course  Procedures (including critical care time) Labs Review Labs  Reviewed - No data to display  Imaging Review Ct Maxillofacial Wo Cm  05/22/2014   CLINICAL DATA:  Status post fall this morning with a blow to the chin. Initial encounter.  EXAM: CT MAXILLOFACIAL WITHOUT CONTRAST  TECHNIQUE: Multidetector CT imaging of the maxillofacial structures was performed. Multiplanar CT image reconstructions were also generated. A small metallic BB was placed on the right temple in order to reliably differentiate right from left.  COMPARISON:  None.  FINDINGS: Subcutaneous air is seen along the anterior mandible consistent with  laceration. There is an implant along the body of the left mandible. No fracture is identified. The mandibular condyles are located. Very small mucous retention cyst or polyp left maxillary sinus is identified. Paranasal sinuses and mastoid air cells are otherwise clear. No acute abnormality of the upper cervical spine is identified. Imaged intracranial contents appear normal.  IMPRESSION: Laceration without facial bone fracture.   Electronically Signed   By: Inge Rise M.D.   On: 05/22/2014 10:57     EKG Interpretation None      MDM   Final diagnoses:  Fall  Chin laceration, initial encounter    Discussed case with Dr. Wilson Singer.  LACERATION REPAIR Performed by: Linus Mako Authorized by: Linus Mako Consent: Verbal consent obtained. Risks and benefits: risks, benefits and alternatives were discussed Consent given by: patient Patient identity confirmed: provided demographic data Prepped and Draped in normal sterile fashion Wound explored  Laceration Location: chin laceration  Laceration Length: 1.5 cm  No Foreign Bodies seen or palpated  Anesthesia: local infiltration  Local anesthetic: lidocaine 2 % w epinephrine  Anesthetic total: 1.5 ml  Irrigation method: syringe Amount of cleaning: standard  Skin closure: sutures  Number of sutures: 3  Technique: simple interrupted  Patient tolerance: Patient tolerated the procedure well with no immediate complications.   Sutures to be taken out in 7 days. UTD on tetanus.  The patient denies any symptoms of neurological impairment or TIA's; no amaurosis, diplopia, dysphasia, or unilateral disturbance of motor or sensory function. No loss of balance or vertigo.  80 y.o.Madeline Burke Flud's evaluation in the Emergency Department is complete. It has been determined that no acute conditions requiring further emergency intervention are present at this time. The patient/guardian have been advised of the diagnosis and  plan. We have discussed signs and symptoms that warrant return to the ED, such as changes or worsening in symptoms.  Vital signs are stable at discharge. Filed Vitals:   05/22/14 0943  BP: 159/69  Pulse: 69  Temp: 98.4 F (36.9 C)  Resp: 18    Patient/guardian has voiced understanding and agreed to follow-up with the PCP or specialist.   Linus Mako, PA-C 05/22/14 [place], MD 05/22/14 1942

## 2014-05-22 NOTE — ED Notes (Signed)
Pt states she tripped over a curb at 0805 this morning and scraped her chin.  No LOC.  Bleeding controlled.

## 2014-05-22 NOTE — Discharge Instructions (Signed)
Facial Laceration  A facial laceration is a cut on the face. These injuries can be painful and cause bleeding. Lacerations usually heal quickly, but they need special care to reduce scarring. DIAGNOSIS  Your health care provider will take a medical history, ask for details about how the injury occurred, and examine the wound to determine how deep the cut is. TREATMENT  Some facial lacerations may not require closure. Others may not be able to be closed because of an increased risk of infection. The risk of infection and the chance for successful closure will depend on various factors, including the amount of time since the injury occurred. The wound may be cleaned to help prevent infection. If closure is appropriate, pain medicines may be given if needed. Your health care provider will use stitches (sutures), wound glue (adhesive), or skin adhesive strips to repair the laceration. These tools bring the skin edges together to allow for faster healing and a better cosmetic outcome. If needed, you may also be given a tetanus shot. HOME CARE INSTRUCTIONS  Only take over-the-counter or prescription medicines as directed by your health care provider.  Follow your health care provider's instructions for wound care. These instructions will vary depending on the technique used for closing the wound. For Sutures:  Keep the wound clean and dry.   If you were given a bandage (dressing), you should change it at least once a day. Also change the dressing if it becomes wet or dirty, or as directed by your health care provider.   Wash the wound with soap and water 2 times a day. Rinse the wound off with water to remove all soap. Pat the wound dry with a clean towel.   After cleaning, apply a thin layer of the antibiotic ointment recommended by your health care provider. This will help prevent infection and keep the dressing from sticking.   You may shower as usual after the first 24 hours. Do not soak the  wound in water until the sutures are removed.   Get your sutures removed as directed by your health care provider. With facial lacerations, sutures should usually be taken out after 4-5 days to avoid stitch marks.   Wait a few days after your sutures are removed before applying any makeup. For Skin Adhesive Strips:  Keep the wound clean and dry.   Do not get the skin adhesive strips wet. You may bathe carefully, using caution to keep the wound dry.   If the wound gets wet, pat it dry with a clean towel.   Skin adhesive strips will fall off on their own. You may trim the strips as the wound heals. Do not remove skin adhesive strips that are still stuck to the wound. They will fall off in time.  For Wound Adhesive:  You may briefly wet your wound in the shower or bath. Do not soak or scrub the wound. Do not swim. Avoid periods of heavy sweating until the skin adhesive has fallen off on its own. After showering or bathing, gently pat the wound dry with a clean towel.   Do not apply liquid medicine, cream medicine, ointment medicine, or makeup to your wound while the skin adhesive is in place. This may loosen the film before your wound is healed.   If a dressing is placed over the wound, be careful not to apply tape directly over the skin adhesive. This may cause the adhesive to be pulled off before the wound is healed.   Avoid   prolonged exposure to sunlight or tanning lamps while the skin adhesive is in place.  The skin adhesive will usually remain in place for 5-10 days, then naturally fall off the skin. Do not pick at the adhesive film.  After Healing: Once the wound has healed, cover the wound with sunscreen during the day for 1 full year. This can help minimize scarring. Exposure to ultraviolet light in the first year will darken the scar. It can take 1-2 years for the scar to lose its redness and to heal completely.  SEEK IMMEDIATE MEDICAL CARE IF:  You have redness, pain, or  swelling around the wound.   You see ayellowish-white fluid (pus) coming from the wound.   You have chills or a fever.  MAKE SURE YOU:  Understand these instructions.  Will watch your condition.  Will get help right away if you are not doing well or get worse. Document Released: 07/15/2004 Document Revised: 03/28/2013 Document Reviewed: 01/18/2013 Exit[employer] Patient Information 2015 [employer]. This information is not intended to replace advice given to you by your health care provider. Make sure you discuss any questions you have with your health care provider.  

## 2014-07-04 ENCOUNTER — Emergency Department (HOSPITAL_COMMUNITY)
Admission: EM | Admit: 2014-07-04 | Discharge: 2014-07-04 | Disposition: A | Discharge status: Home / Self Care | Payer: Medicare Other | Attending: Emergency Medicine | Admitting: Emergency Medicine

## 2014-07-04 DIAGNOSIS — I10 Essential (primary) hypertension: Secondary | ICD-10-CM | POA: Insufficient documentation

## 2014-07-04 DIAGNOSIS — R0602 Shortness of breath: Secondary | ICD-10-CM | POA: Diagnosis not present

## 2014-07-04 DIAGNOSIS — E119 Type 2 diabetes mellitus without complications: Secondary | ICD-10-CM | POA: Diagnosis not present

## 2014-07-04 DIAGNOSIS — Z880 Allergy status to penicillin: Secondary | ICD-10-CM | POA: Insufficient documentation

## 2014-07-04 DIAGNOSIS — R220 Localized swelling, mass and lump, head: Secondary | ICD-10-CM | POA: Diagnosis present

## 2014-07-04 DIAGNOSIS — Z79899 Other long term (current) drug therapy: Secondary | ICD-10-CM | POA: Diagnosis not present

## 2014-07-04 DIAGNOSIS — R1310 Dysphagia, unspecified: Secondary | ICD-10-CM | POA: Diagnosis not present

## 2014-07-04 DIAGNOSIS — E780 Pure hypercholesterolemia: Secondary | ICD-10-CM | POA: Diagnosis not present

## 2014-07-04 DIAGNOSIS — T783XXD Angioneurotic edema, subsequent encounter: Secondary | ICD-10-CM | POA: Insufficient documentation

## 2014-07-04 MED ORDER — DIPHENHYDRAMINE HCL 25 MG PO TABS: 25.0000 mg | ORAL_TABLET | Freq: Four times a day (QID) | ORAL | Status: DC

## 2014-07-04 MED ORDER — METHYLPREDNISOLONE SODIUM SUCC 125 MG IJ SOLR
125.0000 mg | Freq: Once | INTRAMUSCULAR | Status: AC
  Administered 2014-07-04: 125 mg via INTRAVENOUS
  Filled 2014-07-04: qty 2

## 2014-07-04 MED ORDER — FAMOTIDINE 20 MG PO TABS: 20.0000 mg | ORAL_TABLET | Freq: Two times a day (BID) | ORAL | Status: DC

## 2014-07-04 MED ORDER — FAMOTIDINE IN NACL 20-0.9 MG/50ML-% IV SOLN
20.0000 mg | Freq: Once | INTRAVENOUS | Status: AC
  Administered 2014-07-04: 20 mg via INTRAVENOUS
  Filled 2014-07-04: qty 50

## 2014-07-04 MED ORDER — PREDNISONE 20 MG PO TABS: 60.0000 mg | ORAL_TABLET | Freq: Every day | ORAL | Status: DC

## 2014-07-04 MED ORDER — DIPHENHYDRAMINE HCL 50 MG/ML IJ SOLN
25.0000 mg | Freq: Once | INTRAMUSCULAR | Status: AC
  Administered 2014-07-04: 25 mg via INTRAVENOUS
  Filled 2014-07-04: qty 1

## 2014-07-04 NOTE — ED Provider Notes (Signed)
CSN: [id number]     Arrival date & time 07/04/14  1791 History  This chart was scribed for Kalman Drape, MD by Delphia Grates, ED Scribe. This patient was seen in room D34C/D34C and the patient's care was started at 3:21 AM.    No chief complaint on file.   The history is provided by the patient. No language interpreter was used.     HPI Comments: Madeline Burke is a 81 y.o. female, with history of HTN, DM, and angioedema, who presents to the Emergency Department complaining of sudden onset tongue swelling that began approximately 3 hours ago at [place]. She states the swelling began on the right side of the tongue and spread to the left side. Patient reports history of the same with the last episode being on the 23rd of December, however, she did not come to the ED to be evaluated.  She reports difficulty breathing and swallowing when the episodes occur. She states that, during the most recent episode, her tongue had swollen to "roof of the mouth" and was scared she would not be able to breathe.Patient has taken benadryl, in addition to a prescription medication of swelling PTA. Patient has been previously evaluated at [facility], and [facility] for her symptoms, however, she reports no triggers to the swelling have been found.   Past Medical History  Diagnosis Date  . Diabetes mellitus without complication   . Hypertension   . Elevated cholesterol   . History of angioedema 2006   Past Surgical History  Procedure Laterality Date  . Breast cyst excision Right 1997  . Abdominal hysterectomy  1997   Family History  Problem Relation Age of Onset  . Diabetes Other   . Hyperlipidemia Other   . Hypertension Other    History  Substance Use Topics  . Smoking status: Never Smoker   . Smokeless tobacco: Not on file  . Alcohol Use: No   OB History    No data available     Review of Systems  HENT: Positive for trouble swallowing.        Tongue swelling.  Respiratory: Positive for  shortness of breath.       Allergies  Aspirin; Penicillins; and Sulfa antibiotics  Home Medications   Prior to Admission medications   Medication Sig Start Date End Date Taking? Authorizing Provider  amLODipine (NORVASC) 5 MG tablet Take 5 mg by mouth daily.    Historical Provider, MD  Cholecalciferol (VITAMIN D) 1000 UNITS capsule Take 1,000 Units by mouth daily.    Historical Provider, MD  cycloSPORINE (RESTASIS) 0.05 % ophthalmic emulsion 1 drop 2 (two) times daily.    Historical Provider, MD  diphenhydrAMINE (BENADRYL) 25 MG tablet Take 1 tablet (25 mg total) by mouth every 8 (eight) hours as needed for itching. 04/30/12 05/03/12  Rosana Hoes, MD  memantine (NAMENDA) 10 MG tablet Take 10 mg by mouth 2 (two) times daily.    Historical Provider, MD  pioglitazone-metformin (ACTOPLUS MET) 15-850 MG per tablet Take 1 tablet by mouth 2 (two) times daily with a meal.    Historical Provider, MD  rosuvastatin (CRESTOR) 10 MG tablet Take 10 mg by mouth daily.    Historical Provider, MD   Triage Vitals: BP 160/58 mmHg  Pulse 61  Temp(Src) 98.4 F (36.9 C) (Oral)  Resp 18  SpO2 98%  Physical Exam  Constitutional: She is oriented to person, place, and time. She appears well-developed and well-nourished. No distress.  HENT:  Head: Normocephalic  and atraumatic.  Right Ear: External ear normal.  Left Ear: External ear normal.  Nose: Nose normal.  Mouth/Throat: No oropharyngeal exudate.  Patient has angioedema of the tongue, left slightly greater than right.  She is able to open her mouth fully, there is partial obstruction of her posterior pharynx, but she is breathing easily.  There is no stridor.  She has moderate submental soft tissue edema.  She denies any difficulties breathing or swallowing.  Eyes: Conjunctivae and EOM are normal.  Neck: Normal range of motion. Neck supple. No JVD present. No tracheal deviation present. No thyromegaly present.  Cardiovascular: Normal rate, regular  rhythm, normal heart sounds and intact distal pulses.  Exam reveals no gallop and no friction rub.   No murmur heard. Pulmonary/Chest: Effort normal. No stridor. No respiratory distress. She has no wheezes. She has no rales. She exhibits no tenderness.  Musculoskeletal: Normal range of motion. She exhibits no edema or tenderness.  Lymphadenopathy:    She has no cervical adenopathy.  Neurological: She is alert and oriented to person, place, and time. She exhibits normal muscle tone. Coordination normal.  Skin: Skin is warm and dry. No rash noted. No erythema. No pallor.  Psychiatric: She has a normal mood and affect. Her behavior is normal. Judgment and thought content normal.  Nursing note and vitals reviewed.   ED Course  Procedures (including critical care time)  DIAGNOSTIC STUDIES: Oxygen Saturation is 98% on room air, normal by my interpretation.    COORDINATION OF CARE: At 56 Discussed treatment plan with patient which includes observation. Patient agrees.   Labs Review Labs Reviewed - No data to display  Imaging Review No results found.   EKG Interpretation None      MDM   Final diagnoses:  Idiopathic angioedema, subsequent encounter   81 year old female with intermittent angioedema/allergic reaction ongoing since 2009 without known trigger.  Patient has patent airway at this time.  Patient took Benadryl prior to coming to the emergency department with only minimal improvement.  She reports that since receiving Solu-Medrol Pepcid and Benadryl IV that her symptoms seem to be improving.  Patient is not on ACE inhibitor.  She reports that she has had specialist evaluation.  Over the last several years at [facility] as well as [facility].  Patient will need prescriptions for H1 and H2 blockers along with steroids.  If she continues to improve.  Patient with some persistent angioedema of tongue, but both she and her family report that it is much improved.  Patient is stable  for discharge to follow-up with her primary care doctor.  I personally performed the services described in this documentation, which was scribed in my presence. The recorded information has been reviewed and is accurate.     Kalman Drape, MD 07/04/14 [phone]

## 2014-07-04 NOTE — ED Notes (Signed)
Pt from home with tongue swelling.  Sts she ate sauerkraut tonight and awoke around 12 with swelling.  Took 25mg  of benadryl and atarax at home at about 1245.  Noticed no difference and was brought to ED for evaluation by family. Airway intact.

## 2014-12-16 ENCOUNTER — Other Ambulatory Visit: Payer: Self-pay

## 2015-07-03 DIAGNOSIS — H04123 Dry eye syndrome of bilateral lacrimal glands: Secondary | ICD-10-CM | POA: Diagnosis not present

## 2015-07-03 DIAGNOSIS — Z961 Presence of intraocular lens: Secondary | ICD-10-CM | POA: Diagnosis not present

## 2015-07-03 DIAGNOSIS — E119 Type 2 diabetes mellitus without complications: Secondary | ICD-10-CM | POA: Diagnosis not present

## 2015-08-25 DIAGNOSIS — I129 Hypertensive chronic kidney disease with stage 1 through stage 4 chronic kidney disease, or unspecified chronic kidney disease: Secondary | ICD-10-CM | POA: Diagnosis not present

## 2015-08-25 DIAGNOSIS — E1122 Type 2 diabetes mellitus with diabetic chronic kidney disease: Secondary | ICD-10-CM | POA: Diagnosis not present

## 2015-08-25 DIAGNOSIS — N182 Chronic kidney disease, stage 2 (mild): Secondary | ICD-10-CM | POA: Diagnosis not present

## 2015-08-25 DIAGNOSIS — N08 Glomerular disorders in diseases classified elsewhere: Secondary | ICD-10-CM | POA: Diagnosis not present

## 2015-09-19 DIAGNOSIS — M75121 Complete rotator cuff tear or rupture of right shoulder, not specified as traumatic: Secondary | ICD-10-CM | POA: Diagnosis not present

## 2015-10-02 DIAGNOSIS — R921 Mammographic calcification found on diagnostic imaging of breast: Secondary | ICD-10-CM | POA: Diagnosis not present

## 2015-10-16 ENCOUNTER — Emergency Department (HOSPITAL_COMMUNITY)
Admission: EM | Admit: 2015-10-16 | Discharge: 2015-10-16 | Disposition: A | Discharge status: Home / Self Care | Payer: Medicare Other | Attending: Emergency Medicine | Admitting: Emergency Medicine

## 2015-10-16 ENCOUNTER — Encounter (HOSPITAL_COMMUNITY): Payer: Self-pay

## 2015-10-16 DIAGNOSIS — T783XXA Angioneurotic edema, initial encounter: Secondary | ICD-10-CM | POA: Insufficient documentation

## 2015-10-16 DIAGNOSIS — Y9389 Activity, other specified: Secondary | ICD-10-CM | POA: Diagnosis not present

## 2015-10-16 DIAGNOSIS — Z79899 Other long term (current) drug therapy: Secondary | ICD-10-CM | POA: Diagnosis not present

## 2015-10-16 DIAGNOSIS — I10 Essential (primary) hypertension: Secondary | ICD-10-CM | POA: Diagnosis not present

## 2015-10-16 DIAGNOSIS — Z880 Allergy status to penicillin: Secondary | ICD-10-CM | POA: Diagnosis not present

## 2015-10-16 DIAGNOSIS — E7800 Pure hypercholesterolemia, unspecified: Secondary | ICD-10-CM | POA: Insufficient documentation

## 2015-10-16 DIAGNOSIS — E119 Type 2 diabetes mellitus without complications: Secondary | ICD-10-CM | POA: Insufficient documentation

## 2015-10-16 DIAGNOSIS — X58XXXA Exposure to other specified factors, initial encounter: Secondary | ICD-10-CM | POA: Diagnosis not present

## 2015-10-16 DIAGNOSIS — Y998 Other external cause status: Secondary | ICD-10-CM | POA: Diagnosis not present

## 2015-10-16 DIAGNOSIS — Y9289 Other specified places as the place of occurrence of the external cause: Secondary | ICD-10-CM | POA: Diagnosis not present

## 2015-10-16 LAB — CBC WITH DIFFERENTIAL/PLATELET
Basophils Absolute: 0 10*3/uL (ref 0.0–0.1)
Basophils Relative: 0 %
Eosinophils Absolute: 0 10*3/uL (ref 0.0–0.7)
Eosinophils Relative: 1 %
HEMATOCRIT: 39.2 % (ref 36.0–46.0)
HEMOGLOBIN: 12 g/dL (ref 12.0–15.0)
LYMPHS ABS: 1.3 10*3/uL (ref 0.7–4.0)
LYMPHS PCT: 36 %
MCH: 25.7 pg — ABNORMAL LOW (ref 26.0–34.0)
MCHC: 30.6 g/dL (ref 30.0–36.0)
MCV: 83.9 fL (ref 78.0–100.0)
MONO ABS: 0.3 10*3/uL (ref 0.1–1.0)
MONOS PCT: 9 %
NEUTROS ABS: 2 10*3/uL (ref 1.7–7.7)
NEUTROS PCT: 54 %
Platelets: 197 10*3/uL (ref 150–400)
RBC: 4.67 MIL/uL (ref 3.87–5.11)
RDW: 14.8 % (ref 11.5–15.5)
WBC: 3.6 10*3/uL — ABNORMAL LOW (ref 4.0–10.5)

## 2015-10-16 LAB — BASIC METABOLIC PANEL
Anion gap: 9 (ref 5–15)
BUN: 20 mg/dL (ref 6–20)
CALCIUM: 9.2 mg/dL (ref 8.9–10.3)
CHLORIDE: 106 mmol/L (ref 101–111)
CO2: 28 mmol/L (ref 22–32)
CREATININE: 0.96 mg/dL (ref 0.44–1.00)
GFR calc Af Amer: >60 mL/min (ref >60)
GFR calc non Af Amer: 54 mL/min — ABNORMAL LOW (ref >60)
GLUCOSE: 119 mg/dL — AB (ref 65–99)
Potassium: 4.7 mmol/L (ref 3.5–5.1)
Sodium: 143 mmol/L (ref 135–145)

## 2015-10-16 MED ORDER — METHYLPREDNISOLONE SODIUM SUCC 125 MG IJ SOLR
125.0000 mg | Freq: Once | INTRAMUSCULAR | Status: AC
  Administered 2015-10-16: 125 mg via INTRAVENOUS
  Filled 2015-10-16: qty 2

## 2015-10-16 MED ORDER — DIPHENHYDRAMINE HCL 50 MG/ML IJ SOLN
25.0000 mg | Freq: Once | INTRAMUSCULAR | Status: AC
  Administered 2015-10-16: 25 mg via INTRAVENOUS
  Filled 2015-10-16: qty 1

## 2015-10-16 MED ORDER — FAMOTIDINE IN NACL 20-0.9 MG/50ML-% IV SOLN
20.0000 mg | Freq: Once | INTRAVENOUS | Status: AC
  Administered 2015-10-16: 20 mg via INTRAVENOUS
  Filled 2015-10-16: qty 50

## 2015-10-16 MED ORDER — PREDNISONE 20 MG PO TABS: ORAL_TABLET | ORAL | Status: DC

## 2015-10-16 NOTE — ED Notes (Signed)
Pt from home with angioedema. Pt doesn't complain of SOB, airway in tact.

## 2015-10-16 NOTE — ED Notes (Signed)
Entered room to help primary nurse start IV, pt family began stating "my mom will only be stuck in her hand" This RN proceeds to look for an IV in one hand, didn't see anything good and went to the other. 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 Family member proceeds to tell tech that she wants to speak to a Freight forwarder. Charge RN aware.

## 2015-10-16 NOTE — ED Provider Notes (Signed)
CSN: [id number]     Arrival date & time 10/16/15  7017 History   First MD Initiated Contact with Patient 10/16/15 0522     Chief Complaint  Patient presents with  . Angioedema     (Consider location/radiation/quality/duration/timing/severity/associated sxs/prior Treatment) HPI Comments: Patient presents to the emergency department for evaluation of angioedema. 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 She has seen specialists at [facility] for this. She has a prescription for hydroxyzine and Benadryl to take when the swelling starts. She woke up at 2:20 AM with swelling of her tongue and took her medications without improvement.   Past Medical History  Diagnosis Date  . Diabetes mellitus without complication ([place])   . Hypertension   . Elevated cholesterol   . History of angioedema 2006   Past Surgical History  Procedure Laterality Date  . Breast cyst excision Right 1997  . Abdominal hysterectomy  1997   Family History  Problem Relation Age of Onset  . Diabetes Other   . Hyperlipidemia Other   . Hypertension Other    Social History  Substance Use Topics  . Smoking status: Never Smoker   . Smokeless tobacco: None  . Alcohol Use: No   OB History    No data available     Review of Systems  HENT:       Tongue swelling      Allergies  Aspirin; Bee venom; Fish allergy; Lisinopril; Penicillins; Shellfish allergy; and Sulfa antibiotics  Home Medications   Prior to Admission medications   Medication Sig Start Date End Date Taking? Authorizing Provider  amLODipine (NORVASC) 5 MG tablet Take 2.5 mg by mouth daily.    Yes Historical Provider, MD  Calcium-Magnesium-Vitamin D (CALCIUM MAGNESIUM PO) Take 1 tablet by mouth daily.   Yes Historical Provider, MD  carvedilol (COREG) 12.5 MG tablet Take 12.5 mg by mouth 2 (two) times daily. 04/15/14  Yes Historical Provider, MD  Cholecalciferol (VITAMIN D) 1000 UNITS capsule Take 2,000  Units by mouth daily.    Yes Historical Provider, MD  citalopram (CELEXA) 10 MG tablet Take 10 mg by mouth daily. 04/15/14  Yes Historical Provider, MD  cycloSPORINE (RESTASIS) 0.05 % ophthalmic emulsion Place 1 drop into both eyes 2 (two) times daily.    Yes Historical Provider, MD  diphenhydrAMINE (BENADRYL) 25 MG tablet Take 1 tablet (25 mg total) by mouth every 6 (six) hours. Patient taking differently: Take 25 mg by mouth every 6 (six) hours as needed for itching or allergies.  07/04/14  Yes Linton Flemings, MD  famotidine (PEPCID) 20 MG tablet Take 1 tablet (20 mg total) by mouth 2 (two) times daily. Patient taking differently: Take 20 mg by mouth 2 (two) times daily as needed for heartburn or indigestion.  07/04/14  Yes Linton Flemings, MD  hydrochlorothiazide (HYDRODIURIL) 25 MG tablet Take 25 mg by mouth daily.   Yes Historical Provider, MD  hydrOXYzine (ATARAX/VISTARIL) 25 MG tablet Take 25 mg by mouth 3 (three) times daily as needed for itching.   Yes Historical Provider, MD  losartan (COZAAR) 25 MG tablet Take 25 mg by mouth daily. 04/15/14  Yes Historical Provider, MD  Multiple Vitamin (MULTIVITAMIN WITH MINERALS) TABS tablet Take 1 tablet by mouth daily.   Yes Historical Provider, MD  NAMENDA XR 28 MG CP24 24 hr capsule Take 28 mg by mouth daily. 04/16/14  Yes Historical Provider, MD  pioglitazone-metformin (ACTOPLUS MET) 15-850 MG per tablet Take 1 tablet by mouth  daily.    Yes Historical Provider, MD  rosuvastatin (CRESTOR) 10 MG tablet Take 10 mg by mouth daily.   Yes Historical Provider, MD  predniSONE (DELTASONE) 20 MG tablet 3 tabs po daily x 3 days, then 2 tabs x 3 days, then 1.5 tabs x 3 days, then 1 tab x 3 days, then 0.5 tabs x 3 days 10/16/15   Orpah Greek, MD   BP 165/70 mmHg  Pulse 54  Resp 16  SpO2 99% Physical Exam  Constitutional: She is oriented to person, place, and time. She appears well-developed and well-nourished. No distress.  HENT:  Head: Normocephalic and  atraumatic.  Right Ear: Hearing normal.  Left Ear: Hearing normal.  Nose: Nose normal.  Mouth/Throat: Oropharynx is clear and moist and mucous membranes are normal.  Edema of tongue and palpable swelling of submental region  Eyes: Conjunctivae and EOM are normal. Pupils are equal, round, and reactive to light.  Neck: Normal range of motion. Neck supple.  Cardiovascular: Regular rhythm, S1 normal and S2 normal.  Exam reveals no gallop and no friction rub.   No murmur heard. Pulmonary/Chest: Effort normal and breath sounds normal. No respiratory distress. She exhibits no tenderness.  Abdominal: Soft. Normal appearance and bowel sounds are normal. There is no hepatosplenomegaly. There is no tenderness. There is no rebound, no guarding, no tenderness at McBurney's point and negative Murphy's sign. No hernia.  Musculoskeletal: Normal range of motion.  Neurological: She is alert and oriented to person, place, and time. She has normal strength. No cranial nerve deficit or sensory deficit. Coordination normal. GCS eye subscore is 4. GCS verbal subscore is 5. GCS motor subscore is 6.  Skin: Skin is warm, dry and intact. No rash noted. No cyanosis.  Psychiatric: She has a normal mood and affect. Her speech is normal and behavior is normal. Thought content normal.  Nursing note and vitals reviewed.   ED Course  Procedures (including critical care time) Labs Review Labs Reviewed  CBC WITH DIFFERENTIAL/PLATELET - Abnormal; Notable for the following:    WBC 3.6 (*)    MCH 25.7 (*)    All other components within normal limits  BASIC METABOLIC PANEL - Abnormal; Notable for the following:    Glucose, Bld 119 (*)    GFR calc non Af Amer 54 (*)    All other components within normal limits    Imaging Review No results found. I have personally reviewed and evaluated these images and lab results as part of my medical decision-making.   EKG Interpretation None      MDM   Final diagnoses:   Angioedema, initial encounter    Patient presents to the emergency department for evaluation of swelling of her tongue. Patient has a history of either hereditary angioedema or idiopathic angioedema. She has been experiencing episodes of angioedema since 2009. She has been studied at [facility]. Her doctors have her take Benadryl when the symptoms began. She did take Benadryl prior to coming to the ER but did not see improvement.  Patient tells me that she has been experiencing this for years and always improves with treatment. She was given additional Benadryl, Axid, Solu-Medrol and monitored. Her swelling is improving here in the ER. Because of the amount of swelling initially present, however, I did recommend admission. Patient and daughter both did not wish for her to be admitted. They felt that she has had worse swelling in the past and that she has never required admission for this.  I did discuss with them the possibility of being unable to breathe with more swelling, but they did not wish for admission. Patient was therefore discharged with strict instructions to return for any increased swelling. She will continue her prednisone taper. She will watch her blood sugars closely while on prednisone.      Orpah Greek, MD 10/17/15 [id number]

## 2015-10-16 NOTE — ED Notes (Signed)
Pt verbalized understanding of d/c instructions, prescriptions, and follow-up care. No further questions/concerns, VSS, ambulatory w/ steady gait (refused wheelchair) 

## 2015-10-16 NOTE — Discharge Instructions (Signed)
Angioedema  Angioedema is a sudden swelling of tissues, often of the skin. It can occur on the face or genitals or in the abdomen or other body parts. The swelling usually develops over a short period and gets better in 24 to 48 hours. It often begins during the night and is found when the person wakes up. The person may also get red, itchy patches of skin (hives). Angioedema can be dangerous if it involves swelling of the air passages.   Depending on the cause, episodes of angioedema may only happen once, come back in unpredictable patterns, or repeat for several years and then gradually fade away.   CAUSES   Angioedema can be caused by an allergic reaction to various triggers. It can also result from nonallergic causes, including reactions to drugs, immune system disorders, viral infections, or an abnormal gene that is passed to you from your parents (hereditary). For some people with angioedema, the cause is unknown.   Some things that can trigger angioedema include:    Foods.    Medicines, such as ACE inhibitors, ARBs, nonsteroidal anti-inflammatory agents, or estrogen.    Latex.    Animal saliva.    Insect stings.    Dyes used in X-rays.    Mild injury.    Dental work.   Surgery.   Stress.    Sudden changes in temperature.    Exercise.  SIGNS AND SYMPTOMS    Swelling of the skin.   Hives. If these are present, there is also intense itching.   Redness in the affected area.    Pain in the affected area.   Swollen lips or tongue.   Breathing problems. This may happen if the air passages swell.   Wheezing.  If internal organs are involved, there may be:    Nausea.    Abdominal pain.    Vomiting.    Difficulty swallowing.    Difficulty passing urine.  DIAGNOSIS    Your health care provider will examine the affected area and take a medical and family history.   Various tests may be done to help determine the cause. Tests may include:   Allergy skin tests to see if the problem  is an allergic reaction.    Blood tests to check for hereditary angioedema.    Tests to check for underlying diseases that could cause the condition.    A review of your medicines, including over-the-counter medicines, may be done.  TREATMENT   Treatment will depend on the cause of the angioedema. Possible treatments include:    Removal of anything that triggered the condition (such as stopping certain medicines).    Medicines to treat symptoms or prevent attacks. Medicines given may include:     Antihistamines.     Epinephrine injection.     Steroids.    Hospitalization may be required for severe attacks. If the air passages are affected, it can be an emergency. Tubes may need to be placed to keep the airway open.  HOME CARE INSTRUCTIONS    Take all medicines as directed by your health care provider.   If you were given medicines for emergency allergy treatment, always carry them with you.   Wear a medical bracelet as directed by your health care provider.    Avoid known triggers.  SEEK MEDICAL CARE IF:    You have repeat attacks of angioedema.    Your attacks are more frequent or more severe despite preventive measures.    You have hereditary angioedema   and are considering having children. It is important to discuss with your health care provider the risks of passing the condition on to your children.  SEEK IMMEDIATE MEDICAL CARE IF:    You have severe swelling of the mouth, tongue, or lips.   You have difficulty breathing.    You have difficulty swallowing.    You faint.  MAKE SURE YOU:   Understand these instructions.   Will watch your condition.   Will get help right away if you are not doing well or get worse.     This information is not intended to replace advice given to you by your health care provider. Make sure you discuss any questions you have with your health care provider.     Document Released: 08/16/2001 Document Revised: 06/28/2014 Document Reviewed:  01/29/2013  Elsevier Interactive Patient Education 2016 [employer]vier Inc[employer]

## 2015-10-16 NOTE — ED Notes (Signed)
Pt walked to the bathroom room with no assistance from RN.

## 2015-10-17 DIAGNOSIS — M75121 Complete rotator cuff tear or rupture of right shoulder, not specified as traumatic: Secondary | ICD-10-CM | POA: Diagnosis not present

## 2015-10-21 DIAGNOSIS — T783XXS Angioneurotic edema, sequela: Secondary | ICD-10-CM | POA: Diagnosis not present

## 2015-10-21 DIAGNOSIS — R350 Frequency of micturition: Secondary | ICD-10-CM | POA: Diagnosis not present

## 2015-10-21 DIAGNOSIS — E1122 Type 2 diabetes mellitus with diabetic chronic kidney disease: Secondary | ICD-10-CM | POA: Diagnosis not present

## 2015-10-21 DIAGNOSIS — N182 Chronic kidney disease, stage 2 (mild): Secondary | ICD-10-CM | POA: Diagnosis not present

## 2015-12-25 DIAGNOSIS — N08 Glomerular disorders in diseases classified elsewhere: Secondary | ICD-10-CM | POA: Diagnosis not present

## 2015-12-25 DIAGNOSIS — N182 Chronic kidney disease, stage 2 (mild): Secondary | ICD-10-CM | POA: Diagnosis not present

## 2015-12-25 DIAGNOSIS — E1122 Type 2 diabetes mellitus with diabetic chronic kidney disease: Secondary | ICD-10-CM | POA: Diagnosis not present

## 2015-12-25 DIAGNOSIS — I129 Hypertensive chronic kidney disease with stage 1 through stage 4 chronic kidney disease, or unspecified chronic kidney disease: Secondary | ICD-10-CM | POA: Diagnosis not present

## 2016-04-01 DIAGNOSIS — R921 Mammographic calcification found on diagnostic imaging of breast: Secondary | ICD-10-CM | POA: Diagnosis not present

## 2016-04-08 DIAGNOSIS — I129 Hypertensive chronic kidney disease with stage 1 through stage 4 chronic kidney disease, or unspecified chronic kidney disease: Secondary | ICD-10-CM | POA: Diagnosis not present

## 2016-04-08 DIAGNOSIS — E1122 Type 2 diabetes mellitus with diabetic chronic kidney disease: Secondary | ICD-10-CM | POA: Diagnosis not present

## 2016-04-08 DIAGNOSIS — N08 Glomerular disorders in diseases classified elsewhere: Secondary | ICD-10-CM | POA: Diagnosis not present

## 2016-04-08 DIAGNOSIS — E785 Hyperlipidemia, unspecified: Secondary | ICD-10-CM | POA: Diagnosis not present

## 2016-04-08 DIAGNOSIS — N182 Chronic kidney disease, stage 2 (mild): Secondary | ICD-10-CM | POA: Diagnosis not present

## 2016-04-29 DIAGNOSIS — Z79899 Other long term (current) drug therapy: Secondary | ICD-10-CM | POA: Diagnosis not present

## 2016-04-29 DIAGNOSIS — N182 Chronic kidney disease, stage 2 (mild): Secondary | ICD-10-CM | POA: Diagnosis not present

## 2016-04-29 DIAGNOSIS — I129 Hypertensive chronic kidney disease with stage 1 through stage 4 chronic kidney disease, or unspecified chronic kidney disease: Secondary | ICD-10-CM | POA: Diagnosis not present

## 2016-05-24 DIAGNOSIS — N08 Glomerular disorders in diseases classified elsewhere: Secondary | ICD-10-CM | POA: Diagnosis not present

## 2016-05-24 DIAGNOSIS — N182 Chronic kidney disease, stage 2 (mild): Secondary | ICD-10-CM | POA: Diagnosis not present

## 2016-05-24 DIAGNOSIS — E1122 Type 2 diabetes mellitus with diabetic chronic kidney disease: Secondary | ICD-10-CM | POA: Diagnosis not present

## 2016-07-02 DIAGNOSIS — I129 Hypertensive chronic kidney disease with stage 1 through stage 4 chronic kidney disease, or unspecified chronic kidney disease: Secondary | ICD-10-CM | POA: Diagnosis not present

## 2016-07-02 DIAGNOSIS — N08 Glomerular disorders in diseases classified elsewhere: Secondary | ICD-10-CM | POA: Diagnosis not present

## 2016-07-02 DIAGNOSIS — N182 Chronic kidney disease, stage 2 (mild): Secondary | ICD-10-CM | POA: Diagnosis not present

## 2016-07-02 DIAGNOSIS — E1122 Type 2 diabetes mellitus with diabetic chronic kidney disease: Secondary | ICD-10-CM | POA: Diagnosis not present

## 2016-07-14 ENCOUNTER — Emergency Department (HOSPITAL_COMMUNITY): Payer: Medicare Other

## 2016-07-14 ENCOUNTER — Encounter (HOSPITAL_COMMUNITY): Payer: Self-pay

## 2016-07-14 DIAGNOSIS — R0602 Shortness of breath: Secondary | ICD-10-CM | POA: Diagnosis not present

## 2016-07-14 DIAGNOSIS — Z7984 Long term (current) use of oral hypoglycemic drugs: Secondary | ICD-10-CM | POA: Insufficient documentation

## 2016-07-14 DIAGNOSIS — R1110 Vomiting, unspecified: Secondary | ICD-10-CM | POA: Diagnosis not present

## 2016-07-14 DIAGNOSIS — J111 Influenza due to unidentified influenza virus with other respiratory manifestations: Secondary | ICD-10-CM | POA: Insufficient documentation

## 2016-07-14 DIAGNOSIS — R0981 Nasal congestion: Secondary | ICD-10-CM | POA: Insufficient documentation

## 2016-07-14 DIAGNOSIS — Z79899 Other long term (current) drug therapy: Secondary | ICD-10-CM | POA: Insufficient documentation

## 2016-07-14 DIAGNOSIS — I10 Essential (primary) hypertension: Secondary | ICD-10-CM | POA: Insufficient documentation

## 2016-07-14 DIAGNOSIS — E119 Type 2 diabetes mellitus without complications: Secondary | ICD-10-CM | POA: Insufficient documentation

## 2016-07-14 DIAGNOSIS — R05 Cough: Secondary | ICD-10-CM | POA: Diagnosis not present

## 2016-07-14 LAB — COMPREHENSIVE METABOLIC PANEL
ALBUMIN: 4 g/dL (ref 3.5–5.0)
ALT: 18 U/L (ref 14–54)
ANION GAP: 11 (ref 5–15)
AST: 30 U/L (ref 15–41)
Alkaline Phosphatase: 61 U/L (ref 38–126)
BILIRUBIN TOTAL: 0.7 mg/dL (ref 0.3–1.2)
BUN: 13 mg/dL (ref 6–20)
CO2: 24 mmol/L (ref 22–32)
Calcium: 9.1 mg/dL (ref 8.9–10.3)
Chloride: 103 mmol/L (ref 101–111)
Creatinine, Ser: 0.81 mg/dL (ref 0.44–1.00)
GFR calc Af Amer: >60 mL/min (ref >60)
GFR calc non Af Amer: >60 mL/min (ref >60)
GLUCOSE: 117 mg/dL — AB (ref 65–99)
POTASSIUM: 4 mmol/L (ref 3.5–5.1)
SODIUM: 138 mmol/L (ref 135–145)
TOTAL PROTEIN: 6.2 g/dL — AB (ref 6.5–8.1)

## 2016-07-14 LAB — CBC WITH DIFFERENTIAL/PLATELET
BASOS PCT: 0 %
Basophils Absolute: 0 10*3/uL (ref 0.0–0.1)
EOS ABS: 0.1 10*3/uL (ref 0.0–0.7)
EOS PCT: 1 %
HEMATOCRIT: 35.1 % — AB (ref 36.0–46.0)
Hemoglobin: 10.8 g/dL — ABNORMAL LOW (ref 12.0–15.0)
Lymphocytes Relative: 4 %
Lymphs Abs: 0.3 10*3/uL — ABNORMAL LOW (ref 0.7–4.0)
MCH: 25.2 pg — ABNORMAL LOW (ref 26.0–34.0)
MCHC: 30.8 g/dL (ref 30.0–36.0)
MCV: 82 fL (ref 78.0–100.0)
MONO ABS: 0.4 10*3/uL (ref 0.1–1.0)
MONOS PCT: 7 %
Neutro Abs: 5.5 10*3/uL (ref 1.7–7.7)
Neutrophils Relative %: 88 %
PLATELETS: 179 10*3/uL (ref 150–400)
RBC: 4.28 MIL/uL (ref 3.87–5.11)
RDW: 14.4 % (ref 11.5–15.5)
WBC: 6.3 10*3/uL (ref 4.0–10.5)
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
## 2016-07-14 NOTE — ED Triage Notes (Signed)
Pt here for cough, sob and flu like symptoms, her pMD call in medication for her but due to sob caergiver brought here here. Speaking in complete sentences.

## 2016-07-15 ENCOUNTER — Emergency Department (HOSPITAL_COMMUNITY)
Admission: EM | Admit: 2016-07-15 | Discharge: 2016-07-15 | Disposition: A | Discharge status: Home / Self Care | Payer: Medicare Other | Attending: Emergency Medicine | Admitting: Emergency Medicine

## 2016-07-15 DIAGNOSIS — R6889 Other general symptoms and signs: Secondary | ICD-10-CM

## 2016-07-15 DIAGNOSIS — R1110 Vomiting, unspecified: Secondary | ICD-10-CM

## 2016-07-15 DIAGNOSIS — J111 Influenza due to unidentified influenza virus with other respiratory manifestations: Secondary | ICD-10-CM | POA: Diagnosis not present

## 2016-07-15 MED ORDER — SODIUM CHLORIDE 0.9 % IV BOLUS (SEPSIS)
500.0000 mL | Freq: Once | INTRAVENOUS | Status: AC
  Administered 2016-07-15: 500 mL via INTRAVENOUS

## 2016-07-15 MED ORDER — ONDANSETRON HCL 4 MG PO TABS: 4.0000 mg | ORAL_TABLET | Freq: Four times a day (QID) | ORAL | 0 refills | Status: DC

## 2016-07-15 NOTE — ED Notes (Signed)
Pt ambulated pulse 87 POX 98% RA
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
## 2016-08-09 DIAGNOSIS — E119 Type 2 diabetes mellitus without complications: Secondary | ICD-10-CM | POA: Diagnosis not present

## 2016-08-09 DIAGNOSIS — Z961 Presence of intraocular lens: Secondary | ICD-10-CM | POA: Diagnosis not present

## 2016-08-09 DIAGNOSIS — H524 Presbyopia: Secondary | ICD-10-CM | POA: Diagnosis not present

## 2016-08-09 DIAGNOSIS — H04123 Dry eye syndrome of bilateral lacrimal glands: Secondary | ICD-10-CM | POA: Diagnosis not present

## 2016-09-10 DIAGNOSIS — R05 Cough: Secondary | ICD-10-CM | POA: Diagnosis not present

## 2016-10-01 DIAGNOSIS — Z1231 Encounter for screening mammogram for malignant neoplasm of breast: Secondary | ICD-10-CM | POA: Diagnosis not present

## 2016-10-13 DIAGNOSIS — N08 Glomerular disorders in diseases classified elsewhere: Secondary | ICD-10-CM | POA: Diagnosis not present

## 2016-10-13 DIAGNOSIS — I129 Hypertensive chronic kidney disease with stage 1 through stage 4 chronic kidney disease, or unspecified chronic kidney disease: Secondary | ICD-10-CM | POA: Diagnosis not present

## 2016-10-13 DIAGNOSIS — E1122 Type 2 diabetes mellitus with diabetic chronic kidney disease: Secondary | ICD-10-CM | POA: Diagnosis not present

## 2016-10-13 DIAGNOSIS — N182 Chronic kidney disease, stage 2 (mild): Secondary | ICD-10-CM | POA: Diagnosis not present

## 2017-01-04 DIAGNOSIS — N182 Chronic kidney disease, stage 2 (mild): Secondary | ICD-10-CM | POA: Diagnosis not present

## 2017-01-04 DIAGNOSIS — N08 Glomerular disorders in diseases classified elsewhere: Secondary | ICD-10-CM | POA: Diagnosis not present

## 2017-01-04 DIAGNOSIS — E1122 Type 2 diabetes mellitus with diabetic chronic kidney disease: Secondary | ICD-10-CM | POA: Diagnosis not present

## 2017-04-06 DIAGNOSIS — N08 Glomerular disorders in diseases classified elsewhere: Secondary | ICD-10-CM | POA: Diagnosis not present

## 2017-04-06 DIAGNOSIS — E1122 Type 2 diabetes mellitus with diabetic chronic kidney disease: Secondary | ICD-10-CM | POA: Diagnosis not present

## 2017-04-06 DIAGNOSIS — I129 Hypertensive chronic kidney disease with stage 1 through stage 4 chronic kidney disease, or unspecified chronic kidney disease: Secondary | ICD-10-CM | POA: Diagnosis not present

## 2017-04-06 DIAGNOSIS — N182 Chronic kidney disease, stage 2 (mild): Secondary | ICD-10-CM | POA: Diagnosis not present

## 2017-05-27 DIAGNOSIS — E1122 Type 2 diabetes mellitus with diabetic chronic kidney disease: Secondary | ICD-10-CM | POA: Diagnosis not present

## 2017-05-27 DIAGNOSIS — N08 Glomerular disorders in diseases classified elsewhere: Secondary | ICD-10-CM | POA: Diagnosis not present

## 2017-05-27 DIAGNOSIS — Z1389 Encounter for screening for other disorder: Secondary | ICD-10-CM | POA: Diagnosis not present

## 2017-05-27 DIAGNOSIS — N182 Chronic kidney disease, stage 2 (mild): Secondary | ICD-10-CM | POA: Diagnosis not present

## 2017-05-27 DIAGNOSIS — I129 Hypertensive chronic kidney disease with stage 1 through stage 4 chronic kidney disease, or unspecified chronic kidney disease: Secondary | ICD-10-CM | POA: Diagnosis not present

## 2017-08-29 DIAGNOSIS — I129 Hypertensive chronic kidney disease with stage 1 through stage 4 chronic kidney disease, or unspecified chronic kidney disease: Secondary | ICD-10-CM | POA: Diagnosis not present

## 2017-08-29 DIAGNOSIS — E1122 Type 2 diabetes mellitus with diabetic chronic kidney disease: Secondary | ICD-10-CM | POA: Diagnosis not present

## 2017-08-29 DIAGNOSIS — N08 Glomerular disorders in diseases classified elsewhere: Secondary | ICD-10-CM | POA: Diagnosis not present

## 2017-08-29 DIAGNOSIS — N182 Chronic kidney disease, stage 2 (mild): Secondary | ICD-10-CM | POA: Diagnosis not present

## 2017-09-08 DIAGNOSIS — N08 Glomerular disorders in diseases classified elsewhere: Secondary | ICD-10-CM | POA: Diagnosis not present

## 2017-09-08 DIAGNOSIS — T8130XS Disruption of wound, unspecified, sequela: Secondary | ICD-10-CM | POA: Diagnosis not present

## 2017-09-08 DIAGNOSIS — E1122 Type 2 diabetes mellitus with diabetic chronic kidney disease: Secondary | ICD-10-CM | POA: Diagnosis not present

## 2017-09-08 DIAGNOSIS — W2203XS Walked into furniture, sequela: Secondary | ICD-10-CM | POA: Diagnosis not present

## 2017-09-15 DIAGNOSIS — T8130XS Disruption of wound, unspecified, sequela: Secondary | ICD-10-CM | POA: Diagnosis not present

## 2017-09-26 DIAGNOSIS — I129 Hypertensive chronic kidney disease with stage 1 through stage 4 chronic kidney disease, or unspecified chronic kidney disease: Secondary | ICD-10-CM | POA: Diagnosis not present

## 2017-09-26 DIAGNOSIS — T8130XS Disruption of wound, unspecified, sequela: Secondary | ICD-10-CM | POA: Diagnosis not present

## 2017-09-26 DIAGNOSIS — N182 Chronic kidney disease, stage 2 (mild): Secondary | ICD-10-CM | POA: Diagnosis not present

## 2017-09-26 DIAGNOSIS — E1122 Type 2 diabetes mellitus with diabetic chronic kidney disease: Secondary | ICD-10-CM | POA: Diagnosis not present

## 2017-09-29 ENCOUNTER — Emergency Department (HOSPITAL_COMMUNITY)
Admission: EM | Admit: 2017-09-29 | Discharge: 2017-09-29 | Disposition: A | Discharge status: Home / Self Care | Payer: Medicare Other | Attending: Emergency Medicine | Admitting: Emergency Medicine

## 2017-09-29 ENCOUNTER — Encounter (HOSPITAL_COMMUNITY): Payer: Self-pay | Admitting: Emergency Medicine

## 2017-09-29 ENCOUNTER — Emergency Department (HOSPITAL_COMMUNITY): Payer: Medicare Other

## 2017-09-29 ENCOUNTER — Other Ambulatory Visit: Payer: Self-pay

## 2017-09-29 DIAGNOSIS — Y929 Unspecified place or not applicable: Secondary | ICD-10-CM | POA: Diagnosis not present

## 2017-09-29 DIAGNOSIS — Z7984 Long term (current) use of oral hypoglycemic drugs: Secondary | ICD-10-CM | POA: Diagnosis not present

## 2017-09-29 DIAGNOSIS — Y999 Unspecified external cause status: Secondary | ICD-10-CM | POA: Diagnosis not present

## 2017-09-29 DIAGNOSIS — T148XXA Other injury of unspecified body region, initial encounter: Secondary | ICD-10-CM

## 2017-09-29 DIAGNOSIS — Z608 Other problems related to social environment: Secondary | ICD-10-CM | POA: Insufficient documentation

## 2017-09-29 DIAGNOSIS — X58XXXA Exposure to other specified factors, initial encounter: Secondary | ICD-10-CM | POA: Diagnosis not present

## 2017-09-29 DIAGNOSIS — Y939 Activity, unspecified: Secondary | ICD-10-CM | POA: Insufficient documentation

## 2017-09-29 DIAGNOSIS — I10 Essential (primary) hypertension: Secondary | ICD-10-CM | POA: Insufficient documentation

## 2017-09-29 DIAGNOSIS — Z79899 Other long term (current) drug therapy: Secondary | ICD-10-CM | POA: Diagnosis not present

## 2017-09-29 DIAGNOSIS — E119 Type 2 diabetes mellitus without complications: Secondary | ICD-10-CM | POA: Insufficient documentation

## 2017-09-29 DIAGNOSIS — S81801A Unspecified open wound, right lower leg, initial encounter: Secondary | ICD-10-CM | POA: Insufficient documentation

## 2017-09-29 DIAGNOSIS — L97309 Non-pressure chronic ulcer of unspecified ankle with unspecified severity: Secondary | ICD-10-CM | POA: Diagnosis not present

## 2017-09-29 IMAGING — CR DG ANKLE COMPLETE 3+V*R*
3 series · 3 of 3 positions shown · non-contrast
Comparison: None.

CLINICAL DATA: Diabetic with medial ankle wound without healing.

EXAM:
RIGHT ANKLE - COMPLETE 3+ VIEW

[x ankle ap right]
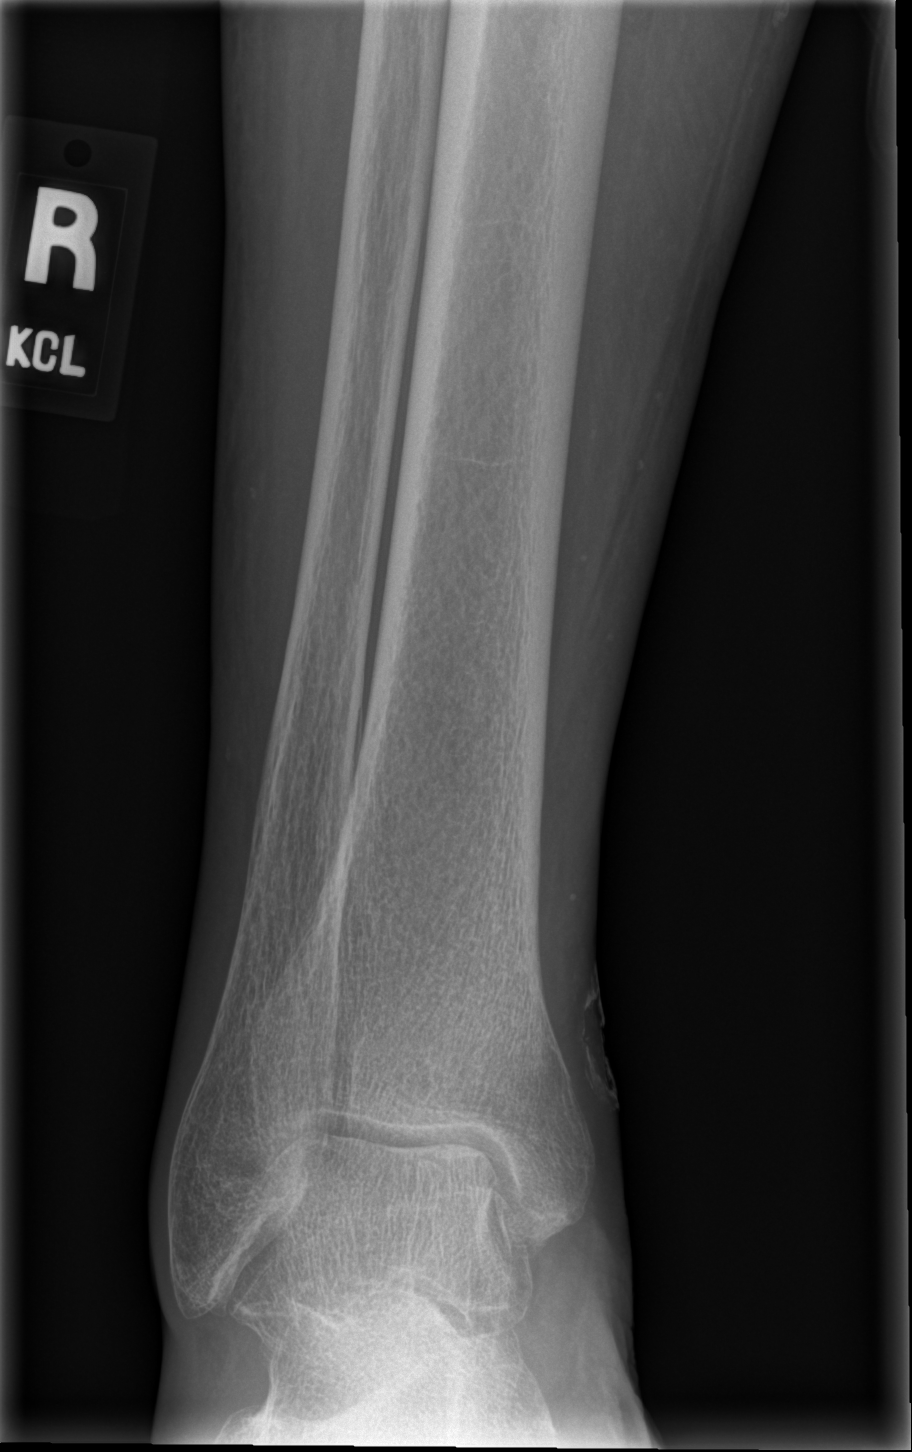

[x ankle obl right]
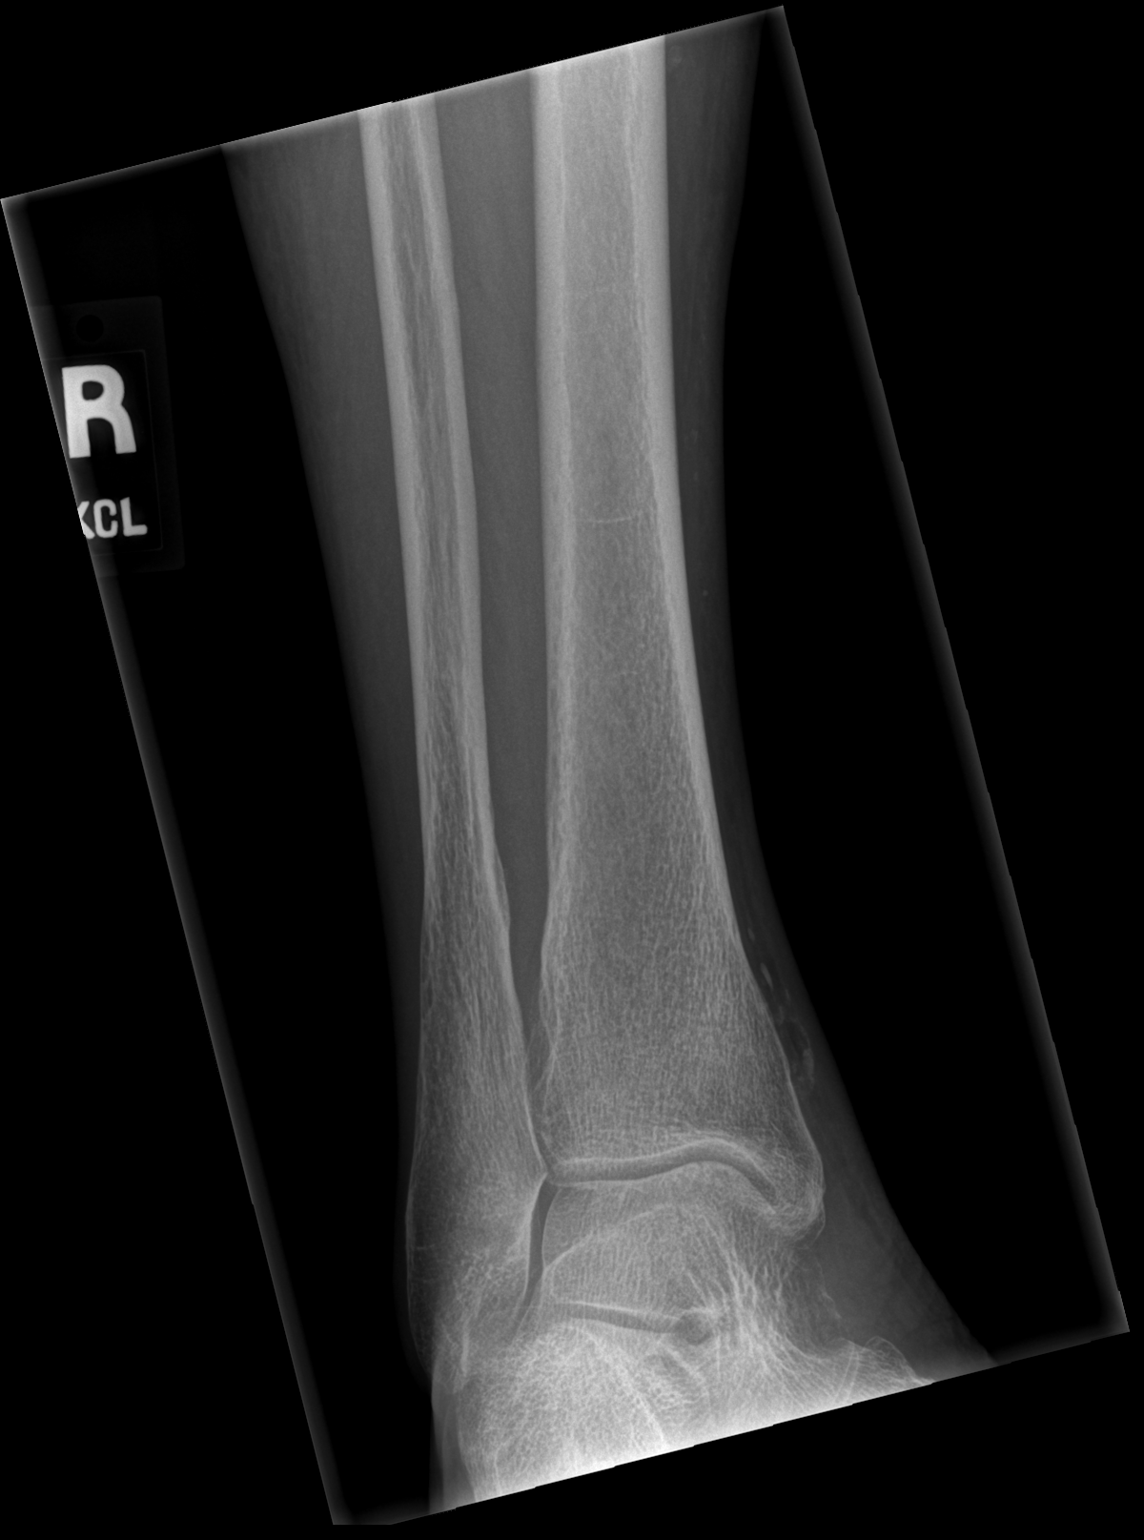

[x ankle lat right]
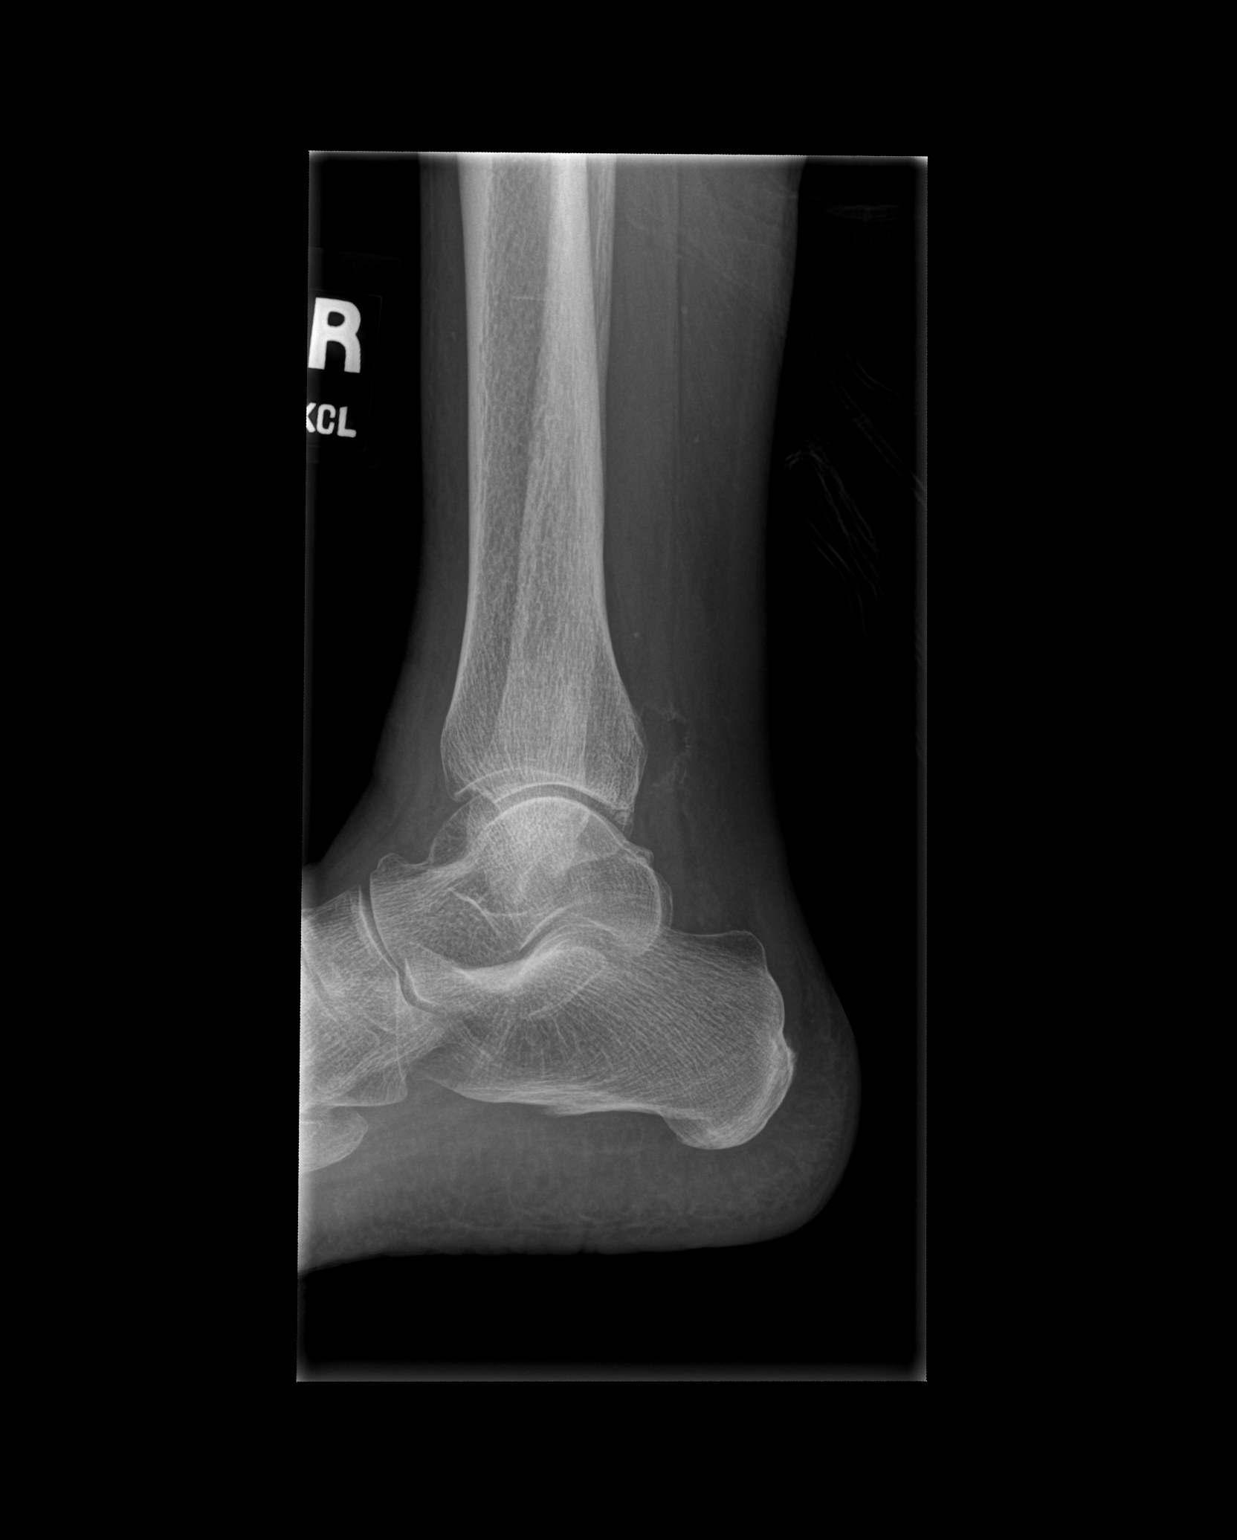

[3 of 3 positions shown; findings below may reference images not displayed]

FINDINGS: Shallow 15 x 12 x 3 mm soft tissue ulcer along the posteromedial
aspect of the ankle without underlying bone destruction or fracture.
No sinus tract or bony sequestrum is seen. Tibiotalar and subtalar
joints are maintained. No joint effusion.
IMPRESSION: Shallow soft tissue ulcer along the posteromedial aspect of the
right ankle without underlying bone destruction or fracture.

## 2017-09-29 MED ORDER — GABAPENTIN 100 MG PO CAPS
100.0000 mg | ORAL_CAPSULE | Freq: Once | ORAL | Status: AC
  Administered 2017-09-29: 100 mg via ORAL
  Filled 2017-09-29: qty 1

## 2017-09-29 MED ORDER — GABAPENTIN 100 MG PO CAPS: 100.0000 mg | ORAL_CAPSULE | Freq: Three times a day (TID) | ORAL | 0 refills | Status: DC
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
## 2017-09-29 NOTE — Progress Notes (Signed)
CSW met with family who requested contact information for Department of Social Services and [facility] Non-Emergency Dispatch should they have any emergencies in the future.  CSW counseled pt and family on how to contact authorities should they have any concerns in the future and provided contact information for Social Services and [facility].  Pt/family requested CSW not call anyone or advise anyone with DSS or [employer] of any concerns they have at this time and stated their concerns are family concerns and can be resolved with the assistance of family at this time.  CSW notes pt is A&OX4 and is not disabled although pt has some trouble walking at the moment and is being assisted by her daughter Bloodsaw-Djimraou,[place]ana.  Please reconsult if future social work needs arise.  CSW signing off, as social work intervention is no longer needed.  Alphonse Guild. Mckynlee Luse, LCSW, LCAS, CSI Clinical Social Worker Ph: [phone]

## 2017-09-29 NOTE — ED Provider Notes (Signed)
[facility] DEPT Provider Note   CSN: [id number] Arrival date & time: 09/29/17  1859     History   Chief Complaint Chief Complaint  Patient presents with  . Wound Check  . needs social worker    HPI Madeline Burke is a 84 y.o. female.  HPI  Patient presents with her daughter who assists with the HPI. Patient presents due to concern of a painful nonhealing wound on the right medial distal lower leg. Patient has diabetes, hypertension, has had a wound for at least several weeks, possibly several months, and is previously purulent, but is currently nonerythematous, non-purulent, though painful. Pain is sharp, severe, transiently improved with Vicodin, the patient and her daughter have some hesitancy to use this medication for pain control. It was concerned that the infection may have spread into the bone. In addition, the patient and her daughter is concerned about another family member currently living at the patient's house, and safety issues about this other individual, they request social work eval.  Past Medical History:  Diagnosis Date  . Diabetes mellitus without complication ([place])   . Elevated cholesterol   . History of angioedema 2006  . Hypertension     There are no active problems to display for this patient.   Past Surgical History:  Procedure Laterality Date  . ABDOMINAL HYSTERECTOMY  1997  . BREAST CYST EXCISION Right 1997     OB History   None      Home Medications    Prior to Admission medications   Medication Sig Start Date End Date Taking? Authorizing Provider  carvedilol (COREG) 12.5 MG tablet Take 12.5 mg by mouth 2 (two) times daily. 04/15/14   [provider]  Cholecalciferol (VITAMIN D3) 2000 units TABS Take 2,000 Units by mouth daily.    [provider]  citalopram (CELEXA) 10 MG tablet Take 10 mg by mouth daily. 04/15/14   [provider]  cycloSPORINE (RESTASIS) 0.05 % ophthalmic  emulsion Place 1 drop into both eyes 2 (two) times daily.     [provider]  diphenhydrAMINE (BENADRYL) 25 MG tablet Take 1 tablet (25 mg total) by mouth every 6 (six) hours. Patient taking differently: Take 25 mg by mouth every 6 (six) hours as needed for itching or allergies.  07/04/14   Linton Flemings, MD  famotidine (PEPCID) 20 MG tablet Take 1 tablet (20 mg total) by mouth 2 (two) times daily. Patient not taking: Reported on 07/15/2016 07/04/14   Linton Flemings, MD  hydrOXYzine (ATARAX/VISTARIL) 25 MG tablet Take 25 mg by mouth 3 (three) times daily as needed for itching.    [provider]  losartan (COZAAR) 50 MG tablet Take 50 mg by mouth daily.    [provider]  Multiple Vitamin (MULTIVITAMIN WITH MINERALS) TABS tablet Take 1 tablet by mouth daily.    [provider]  NAMENDA XR 28 MG CP24 24 hr capsule Take 28 mg by mouth daily. 04/16/14   [provider]  ondansetron (ZOFRAN) 4 MG tablet Take 1 tablet (4 mg total) by mouth every 6 (six) hours. 07/15/16   Charlann Lange, PA-C  oseltamivir (TAMIFLU) 75 MG capsule Take 75 mg by mouth daily. 07/14/16   [provider]  pioglitazone-metformin (ACTOPLUS MET) 15-850 MG per tablet Take 1 tablet by mouth daily.     [provider]  predniSONE (DELTASONE) 20 MG tablet 3 tabs po daily x 3 days, then 2 tabs x 3 days, then 1.5  tabs x 3 days, then 1 tab x 3 days, then 0.5 tabs x 3 days Patient not taking: Reported on 07/15/2016 10/16/15   Orpah Greek, MD  rosuvastatin (CRESTOR) 10 MG tablet Take 10 mg by mouth daily.    [provider]    Family History Family History  Problem Relation Age of Onset  . Diabetes Other   . Hyperlipidemia Other   . Hypertension Other     Social History Social History   Tobacco Use  . Smoking status: Never Smoker  Substance Use Topics  . Alcohol use: No  . Drug use: Not on file     Allergies   Aspirin; Bee venom; Fish allergy;  Lisinopril; Penicillins; Pepcid [famotidine]; Shellfish allergy; Sulfa antibiotics; and Flagyl [metronidazole]   Review of Systems Review of Systems  Constitutional:       Per HPI, otherwise negative  HENT:       Per HPI, otherwise negative  Respiratory:       Per HPI, otherwise negative  Cardiovascular:       Per HPI, otherwise negative  Gastrointestinal: Negative for vomiting.  Endocrine:       Negative aside from HPI  Genitourinary:       Neg aside from HPI   Musculoskeletal:       Per HPI, otherwise negative  Skin: Positive for wound.  Allergic/Immunologic: Positive for immunocompromised state.  Neurological: Negative for syncope.     Physical Exam Updated Vital Signs BP (!) 135/52   Pulse (!) 54   Temp 98.7 F (37.1 C) (Oral)   Resp 16   Ht _0  (1.575 m)   Wt 70.8 kg (156 lb)   SpO2 98%   BMI 28.53 kg/m   Physical Exam  Constitutional: She is oriented to person, place, and time. She appears well-developed and well-nourished. No distress.  HENT:  Head: Normocephalic and atraumatic.  Eyes: Conjunctivae and EOM are normal.  Cardiovascular: Normal rate, regular rhythm and intact distal pulses.  Refill and color in both extremities symmetric, appropriate  Pulmonary/Chest: Effort normal and breath sounds normal. No stridor. No respiratory distress.  Abdominal: She exhibits no distension.  Musculoskeletal: She exhibits no edema.  Neurological: She is alert and oriented to person, place, and time. No cranial nerve deficit.  Distal sensation, motion intact in both lower extremities  Skin: Skin is warm and dry.     Psychiatric: She has a normal mood and affect.  Nursing note and vitals reviewed.    ED Treatments / Results   Radiology Dg Ankle Complete Right  Result Date: 09/29/2017 CLINICAL DATA:  Diabetic with medial ankle wound without healing. EXAM: RIGHT ANKLE - COMPLETE 3+ VIEW COMPARISON:  None. FINDINGS: Shallow 15 x 12 x 3 mm soft tissue ulcer  along the posteromedial aspect of the ankle without underlying bone destruction or fracture. No sinus tract or bony sequestrum is seen. Tibiotalar and subtalar joints are maintained. No joint effusion. IMPRESSION: Shallow soft tissue ulcer along the posteromedial aspect of the right ankle without underlying bone destruction or fracture. Electronically Signed   By: Ashley Royalty M.D.   On: 09/29/2017 21:14    Procedures Procedures (including critical care time)  Medications Ordered in ED Medications  gabapentin (NEURONTIN) capsule 100 mg (has no administration in time range)     Initial Impression / Assessment and Plan / ED Course  I have reviewed the triage vital signs and the nursing notes.  Pertinent labs & imaging results that were  available during my care of the patient were reviewed by me and considered in my medical decision making (see chart for details).  Repeat exam patient is in similar condition. We discussed all findings, including reassuring x-ray, reassuring vitals, low evidence for bacteremia, sepsis, no osteomyelitis. She has spoke with our social work team, has received resources to assist with her family situation. With reassuring physical exam, patient is appropriate for discharge. With her daughter, we discussed options for pain management given her substantial medical history, hesitancy to use narcotics. Patient will start a course of gabapentin.  Final Clinical Impressions(s) / ED Diagnoses   Final diagnoses:  Open wound    ED Discharge Orders        Ordered    gabapentin (NEURONTIN) 100 MG capsule  3 times daily     09/29/17 2251       Carmin Muskrat, MD 09/29/17 2255

## 2017-09-29 NOTE — Discharge Instructions (Addendum)
As discussed, today's evaluation has been generally reassuring and the wound does not currently appear to be infected. Please be sure to follow-up with your primary care physician. Please discuss today's change in your medication, and adjust as necessary with your physician.  Return here for concerning changes in your condition.
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
## 2017-10-24 ENCOUNTER — Encounter (HOSPITAL_BASED_OUTPATIENT_CLINIC_OR_DEPARTMENT_OTHER): Payer: Medicare Other | Attending: Internal Medicine

## 2017-10-24 DIAGNOSIS — L97212 Non-pressure chronic ulcer of right calf with fat layer exposed: Secondary | ICD-10-CM | POA: Insufficient documentation

## 2017-10-24 DIAGNOSIS — E1122 Type 2 diabetes mellitus with diabetic chronic kidney disease: Secondary | ICD-10-CM | POA: Diagnosis not present

## 2017-10-24 DIAGNOSIS — Z7984 Long term (current) use of oral hypoglycemic drugs: Secondary | ICD-10-CM | POA: Insufficient documentation

## 2017-10-24 DIAGNOSIS — I129 Hypertensive chronic kidney disease with stage 1 through stage 4 chronic kidney disease, or unspecified chronic kidney disease: Secondary | ICD-10-CM | POA: Diagnosis not present

## 2017-10-24 DIAGNOSIS — E11622 Type 2 diabetes mellitus with other skin ulcer: Secondary | ICD-10-CM | POA: Insufficient documentation

## 2017-10-24 DIAGNOSIS — N182 Chronic kidney disease, stage 2 (mild): Secondary | ICD-10-CM | POA: Diagnosis not present

## 2017-10-24 DIAGNOSIS — E10622 Type 1 diabetes mellitus with other skin ulcer: Secondary | ICD-10-CM | POA: Diagnosis not present

## 2017-10-24 DIAGNOSIS — E11621 Type 2 diabetes mellitus with foot ulcer: Secondary | ICD-10-CM | POA: Diagnosis not present

## 2017-10-31 DIAGNOSIS — L97212 Non-pressure chronic ulcer of right calf with fat layer exposed: Secondary | ICD-10-CM | POA: Diagnosis not present

## 2017-10-31 DIAGNOSIS — I129 Hypertensive chronic kidney disease with stage 1 through stage 4 chronic kidney disease, or unspecified chronic kidney disease: Secondary | ICD-10-CM | POA: Diagnosis not present

## 2017-10-31 DIAGNOSIS — Z7984 Long term (current) use of oral hypoglycemic drugs: Secondary | ICD-10-CM | POA: Diagnosis not present

## 2017-10-31 DIAGNOSIS — N182 Chronic kidney disease, stage 2 (mild): Secondary | ICD-10-CM | POA: Diagnosis not present

## 2017-10-31 DIAGNOSIS — E11622 Type 2 diabetes mellitus with other skin ulcer: Secondary | ICD-10-CM | POA: Diagnosis not present

## 2017-10-31 DIAGNOSIS — E1122 Type 2 diabetes mellitus with diabetic chronic kidney disease: Secondary | ICD-10-CM | POA: Diagnosis not present

## 2017-11-07 DIAGNOSIS — L97212 Non-pressure chronic ulcer of right calf with fat layer exposed: Secondary | ICD-10-CM | POA: Diagnosis not present

## 2017-11-07 DIAGNOSIS — E10622 Type 1 diabetes mellitus with other skin ulcer: Secondary | ICD-10-CM | POA: Diagnosis not present

## 2017-11-07 DIAGNOSIS — E1122 Type 2 diabetes mellitus with diabetic chronic kidney disease: Secondary | ICD-10-CM | POA: Diagnosis not present

## 2017-11-07 DIAGNOSIS — I129 Hypertensive chronic kidney disease with stage 1 through stage 4 chronic kidney disease, or unspecified chronic kidney disease: Secondary | ICD-10-CM | POA: Diagnosis not present

## 2017-11-07 DIAGNOSIS — N182 Chronic kidney disease, stage 2 (mild): Secondary | ICD-10-CM | POA: Diagnosis not present

## 2017-11-07 DIAGNOSIS — E11622 Type 2 diabetes mellitus with other skin ulcer: Secondary | ICD-10-CM | POA: Diagnosis not present

## 2017-11-07 DIAGNOSIS — Z7984 Long term (current) use of oral hypoglycemic drugs: Secondary | ICD-10-CM | POA: Diagnosis not present

## 2017-11-21 ENCOUNTER — Encounter (HOSPITAL_BASED_OUTPATIENT_CLINIC_OR_DEPARTMENT_OTHER): Payer: Medicare Other | Attending: Internal Medicine

## 2017-11-21 DIAGNOSIS — I10 Essential (primary) hypertension: Secondary | ICD-10-CM | POA: Diagnosis not present

## 2017-11-21 DIAGNOSIS — L97812 Non-pressure chronic ulcer of other part of right lower leg with fat layer exposed: Secondary | ICD-10-CM | POA: Insufficient documentation

## 2017-11-21 DIAGNOSIS — I87321 Chronic venous hypertension (idiopathic) with inflammation of right lower extremity: Secondary | ICD-10-CM | POA: Insufficient documentation

## 2017-11-21 DIAGNOSIS — L97212 Non-pressure chronic ulcer of right calf with fat layer exposed: Secondary | ICD-10-CM | POA: Diagnosis not present

## 2017-11-21 DIAGNOSIS — E11622 Type 2 diabetes mellitus with other skin ulcer: Secondary | ICD-10-CM | POA: Insufficient documentation

## 2017-11-28 DIAGNOSIS — I10 Essential (primary) hypertension: Secondary | ICD-10-CM | POA: Diagnosis not present

## 2017-11-28 DIAGNOSIS — E11622 Type 2 diabetes mellitus with other skin ulcer: Secondary | ICD-10-CM | POA: Diagnosis not present

## 2017-11-28 DIAGNOSIS — L97812 Non-pressure chronic ulcer of other part of right lower leg with fat layer exposed: Secondary | ICD-10-CM | POA: Diagnosis not present

## 2017-11-28 DIAGNOSIS — L89319 Pressure ulcer of right buttock, unspecified stage: Secondary | ICD-10-CM | POA: Diagnosis not present

## 2017-11-28 DIAGNOSIS — I87321 Chronic venous hypertension (idiopathic) with inflammation of right lower extremity: Secondary | ICD-10-CM | POA: Diagnosis not present

## 2018-01-14 ENCOUNTER — Emergency Department (HOSPITAL_COMMUNITY)
Admission: EM | Admit: 2018-01-14 | Discharge: 2018-01-14 | Disposition: A | Discharge status: Home / Self Care | Payer: Medicare Other | Attending: Emergency Medicine | Admitting: Emergency Medicine

## 2018-01-14 ENCOUNTER — Encounter (HOSPITAL_COMMUNITY): Payer: Self-pay | Admitting: Emergency Medicine

## 2018-01-14 DIAGNOSIS — T783XXA Angioneurotic edema, initial encounter: Secondary | ICD-10-CM | POA: Insufficient documentation

## 2018-01-14 DIAGNOSIS — E119 Type 2 diabetes mellitus without complications: Secondary | ICD-10-CM | POA: Insufficient documentation

## 2018-01-14 DIAGNOSIS — Z79899 Other long term (current) drug therapy: Secondary | ICD-10-CM | POA: Diagnosis not present

## 2018-01-14 DIAGNOSIS — I10 Essential (primary) hypertension: Secondary | ICD-10-CM | POA: Diagnosis not present

## 2018-01-14 DIAGNOSIS — R220 Localized swelling, mass and lump, head: Secondary | ICD-10-CM | POA: Diagnosis present

## 2018-01-14 MED ORDER — DIPHENHYDRAMINE HCL 50 MG/ML IJ SOLN
25.0000 mg | Freq: Once | INTRAMUSCULAR | Status: AC
  Administered 2018-01-14: 25 mg via INTRAVENOUS
  Filled 2018-01-14: qty 1

## 2018-01-14 MED ORDER — METHYLPREDNISOLONE SODIUM SUCC 125 MG IJ SOLR
125.0000 mg | Freq: Once | INTRAMUSCULAR | Status: AC
  Administered 2018-01-14: 125 mg via INTRAVENOUS
  Filled 2018-01-14: qty 2

## 2018-01-14 MED ORDER — PREDNISONE 10 MG (21) PO TBPK: ORAL_TABLET | ORAL | 0 refills | Status: DC

## 2018-01-14 NOTE — ED Provider Notes (Signed)
[facility] EMERGENCY DEPARTMENT Provider Note   CSN: [id number] Arrival date & time: 01/14/18  4098     History   Chief Complaint Chief Complaint  Patient presents with  . Angioedema    HPI Madeline Burke is a 84 y.o. female.  The history is provided by the patient and a relative.  She has history of hypertension, hyperlipidemia, diabetes, angioedema and comes in with swelling of her tongue.  She woke up this morning with her tongue swollen.  This is typical of her attacks of angioedema.  Episodes have been occurring over the past 13 years.  She had been evaluated at [facility], with no clear cause found.  Today, she denies any difficulty breathing.  It is uncomfortable to swallow because of her tongue swelling, and swollen tongue is interfering with speech.  Past Medical History:  Diagnosis Date  . Diabetes mellitus without complication ([place])   . Elevated cholesterol   . History of angioedema 2006  . Hypertension     There are no active problems to display for this patient.   Past Surgical History:  Procedure Laterality Date  . ABDOMINAL HYSTERECTOMY  1997  . BREAST CYST EXCISION Right 1997     OB History   None      Home Medications    Prior to Admission medications   Medication Sig Start Date End Date Taking? Authorizing Provider  carvedilol (COREG) 12.5 MG tablet Take 12.5 mg by mouth 2 (two) times daily. 04/15/14   [provider]  Cholecalciferol (VITAMIN D3) 2000 units TABS Take 2,000 Units by mouth daily.    [provider]  citalopram (CELEXA) 10 MG tablet Take 10 mg by mouth daily. 04/15/14   [provider]  cycloSPORINE (RESTASIS) 0.05 % ophthalmic emulsion Place 1 drop into both eyes 2 (two) times daily.     [provider]  diphenhydrAMINE (BENADRYL) 25 MG tablet Take 1 tablet (25 mg total) by mouth every 6 (six) hours. Patient taking differently: Take 25 mg by mouth every 6 (six) hours as  needed for itching or allergies.  07/04/14   Linton Flemings, MD  famotidine (PEPCID) 20 MG tablet Take 1 tablet (20 mg total) by mouth 2 (two) times daily. Patient not taking: Reported on 07/15/2016 07/04/14   Linton Flemings, MD  gabapentin (NEURONTIN) 100 MG capsule Take 1 capsule (100 mg total) by mouth 3 (three) times daily. 09/29/17   Carmin Muskrat, MD  Multiple Vitamin (MULTIVITAMIN WITH MINERALS) TABS tablet Take 1 tablet by mouth daily.    [provider]  NAMENDA XR 28 MG CP24 24 hr capsule Take 28 mg by mouth daily. 04/16/14   [provider]  ondansetron (ZOFRAN) 4 MG tablet Take 1 tablet (4 mg total) by mouth every 6 (six) hours. 07/15/16   Charlann Lange, PA-C  pioglitazone-metformin (ACTOPLUS MET) 15-850 MG per tablet Take 1 tablet by mouth daily.     [provider]  predniSONE (DELTASONE) 20 MG tablet 3 tabs po daily x 3 days, then 2 tabs x 3 days, then 1.5 tabs x 3 days, then 1 tab x 3 days, then 0.5 tabs x 3 days Patient not taking: Reported on 07/15/2016 10/16/15   Orpah Greek, MD  rosuvastatin (CRESTOR) 10 MG tablet Take 10 mg by mouth daily.    [provider]  telmisartan (MICARDIS) 40 MG tablet Take 40 mg by mouth daily. 08/29/17   [provider]    Family History  Family History  Problem Relation Age of Onset  . Diabetes Other   . Hyperlipidemia Other   . Hypertension Other     Social History Social History   Tobacco Use  . Smoking status: Never Smoker  . Smokeless tobacco: Never Used  Substance Use Topics  . Alcohol use: No  . Drug use: Not Currently     Allergies   Aspirin; Bee venom; Famotidine; Fish allergy; Lisinopril; Penicillins; Shellfish allergy; Sulfa antibiotics; Sulfasalazine; and Flagyl [metronidazole]   Review of Systems Review of Systems  All other systems reviewed and are negative.    Physical Exam Updated Vital Signs BP (!) 187/74 (BP Location: Right Arm)   Pulse (!) 53   Temp 98.5 F  (36.9 C) (Oral)   Resp (!) 22   Ht _0  (1.575 m)   Wt 68 kg (150 lb)   SpO2 99%   BMI 27.44 kg/m   Physical Exam  Nursing note and vitals reviewed.  85 year old female, resting comfortably and in no acute distress. Vital signs are significant for elevated blood pressure, mild tachypnea, mild bradycardia. Oxygen saturation is 99%, which is normal. Head is normocephalic and atraumatic. PERRLA, EOMI. tongue is swollen with pattern of angioedema.  No swelling of the lip, soft palate, oropharynx.  No stridor.  No pooling of secretions. Neck is nontender and supple without adenopathy or JVD. Back is nontender and there is no CVA tenderness. Lungs are clear without rales, wheezes, or rhonchi. Chest is nontender. Heart has regular rate and rhythm without murmur. Abdomen is soft, flat, nontender without masses or hepatosplenomegaly and peristalsis is normoactive. Extremities have no cyanosis or edema, full range of motion is present. Skin is warm and dry without rash. Neurologic: Mental status is normal, cranial nerves are intact, there are no motor or sensory deficits.  ED Treatments / Results   No results found.  Procedures Procedures  CRITICAL CARE Performed by: Delora Fuel Total critical care time: 45 minutes Critical care time was exclusive of separately billable procedures and treating other patients. Critical care was necessary to treat or prevent imminent or life-threatening deterioration. Critical care was time spent personally by me on the following activities: development of treatment plan with patient and/or surrogate as well as nursing, discussions with consultants, evaluation of patient's response to treatment, examination of patient, obtaining history from patient or surrogate, ordering and performing treatments and interventions, ordering and review of laboratory studies, ordering and review of radiographic studies, pulse oximetry and re-evaluation of patient's  condition.  Medications Ordered in ED Medications  diphenhydrAMINE (BENADRYL) injection 25 mg (has no administration in time range)  methylPREDNISolone sodium succinate (SOLU-MEDROL) 125 mg/2 mL injection 125 mg (has no administration in time range)     Initial Impression / Assessment and Plan / ED Course  I have reviewed the triage vital signs and the nursing notes.  Recurrent angioedema involving the tongue.  Old records are reviewed confirming several prior ED visits with angioedema of the tongue.  Of note, she is currently on an angiotensin receptor blocker, but she has had episodes when she was not on any ACE inhibitor or ARB.  She is given a dose of diphenhydramine and methylprednisolone.  Famotidine is not given because of allergy.  7:11 AM No change in condition.  Tongue still significantly enlarged.  Still no airway issues.  Case is signed out to Dr. Sherry Ruffing.  Final Clinical Impressions(s) / ED Diagnoses   Final diagnoses:  Angioedema, initial encounter  ED Discharge Orders    None       Delora Fuel, MD 84/85/92 [phone]

## 2018-01-14 NOTE — ED Provider Notes (Signed)
7:05 AM Care assumed from Dr. Roxanne Mins.  At time of transfer care, patient is awaiting reassessment after several hours of monitoring for tongue swelling thought to be related to angioedema.  Patient received steroids and antihistamine.  If patient appears well at around 10 AM, anticipate discharge with a steroid taper as well as instructions to stop the telmisartan and follow-up with her PCP.  11:25 AM Patient was observed for several hours and her tongue swelling appears to be improving.  No stridor or difficulty breathing or swallowing.  Patient feels comfortable going home.  Shared decision-making conversation held with family about need for admission versus admission and they agreed with discharge.  Patient will call 911 if any symptoms begin to worsen.  Patient given prescription for steroid taper as recommended by previous team and will follow-up with PCP.    Patient was also instructed to discontinue the telmisartan.  Patient had no other questions or concerns and was discharged in good condition.  Clinical Impression: 1. Angioedema, initial encounter     Disposition: Discharge  Condition: Good  I have discussed the results, Dx and Tx plan with the pt(& family if present). He/she/they expressed understanding and agree(s) with the plan. Discharge instructions discussed at great length. Strict return precautions discussed and pt &/or family have verbalized understanding of the instructions. No further questions at time of discharge.    New Prescriptions   PREDNISONE (STERAPRED UNI-PAK 21 TAB) 10 MG (21) TBPK TABLET    Take 6 tabs by mouth daily  for 2 days, then 5 tabs for 2 days, then 4 tabs for 2 days, then 3 tabs for 2 days, 2 tabs for 2 days, then 1 tab by mouth daily for 2 days    Follow Up: Glendale Chard, MD [address] [place] [address] [phone]     [facility] EMERGENCY DEPARTMENT [address] [id number] mc [place]       Tegeler, Gwenyth Allegra, MD 01/14/18 1126

## 2018-01-14 NOTE — ED Triage Notes (Signed)
Pt states she woke up this AM with tongue swelling. Denies SOB, pt is slightly anxious in triage.

## 2018-01-14 NOTE — Discharge Instructions (Signed)
Your exam today was consistent with angioedema.  Please stop taking the telmisartan which may be related and causing her symptoms.  You demonstrated stability for several hours in the emergency department and did not have any worsening.  Please use the steroids as recommended by the previous team at discharge.  Please follow-up with your primary doctor in several days.  If any symptoms change or worsen, please return to the nearest emergency department.

## 2018-02-08 ENCOUNTER — Ambulatory Visit (INDEPENDENT_AMBULATORY_CARE_PROVIDER_SITE_OTHER): Payer: Medicare Other | Admitting: Allergy & Immunology

## 2018-02-08 ENCOUNTER — Encounter: Payer: Self-pay | Admitting: Allergy & Immunology

## 2018-02-08 VITALS — BP 112/62 | HR 60 | Resp 16 | Ht 62.0 in | Wt 154.2 lb

## 2018-02-08 DIAGNOSIS — T783XXA Angioneurotic edema, initial encounter: Secondary | ICD-10-CM | POA: Insufficient documentation

## 2018-02-08 DIAGNOSIS — T783XXD Angioneurotic edema, subsequent encounter: Secondary | ICD-10-CM | POA: Diagnosis not present

## 2018-02-08 DIAGNOSIS — J3489 Other specified disorders of nose and nasal sinuses: Secondary | ICD-10-CM

## 2018-02-08 MED ORDER — IPRATROPIUM BROMIDE 0.03 % NA SOLN: 1.0000 | Freq: Four times a day (QID) | NASAL | 5 refills | Status: DC | PRN

## 2018-02-08 MED ORDER — FEXOFENADINE HCL 180 MG PO TABS: 180.0000 mg | ORAL_TABLET | Freq: Every day | ORAL | 5 refills | Status: DC

## 2018-02-08 NOTE — Progress Notes (Signed)
NEW PATIENT  Date of Service/Encounter:  02/08/18  Referring provider: Leeroy Cha, MD   Assessment:   Recurrent angioedema   Rhinorrhea   Madeline Burke is boisterous lovely female presenting for an evaluation of 13 years of recurrent episodes of angioedema. This is accompanied by urticaria and is exquisitely sensitive to Benadryl.  This points towards a histaminergic process.  There is a family history of recurrent episodes of swelling, so we will do work-up for hereditary angioedema.  We will also get an environmental allergy panel to see if they are triggers in her home that could be contributing to her symptoms.  Alpha gal sensitivity is also consideration, as this typically presents with allergic reactions in the middle the night.  She has a history of stinging insect anaphylaxis, so we will confirm this with blood testing today.  Since this does appear to be a histaminergic process, we will start her on suppressive doses of Allegra or Zyrtec.  She has been on Claritin, but this is a week or antihistamine so I would prefer to be a little more aggressive with her treatment.  Once we get the lab results back, we will call her and come up with an additional plan.  She has been on lisinopril in the past, which brings up the idea of ACE inhibitor induced angioedema, but she has been off of this for quite some time.  She is not on an angiotensin receptor blocker.  She also has rhinorrhea, for which we are starting Atrovent 1 spray per nostril every 6 hours as needed.  Plan/Recommendations:   1. Angioedema - We will get a lot of labs today to look for serious causes of hives and swelling. - We will call you in 1-2 weeks with the results of the testing. - Stop the loratadine since this is a weak antihistamine.  - I would like to put you on a daily antihistamine: Allegra (fexofenadine) 147m once daily  2. Rhinorrhea - We will get an environmental allergy panel to see if there are  any allergic triggers.  - We will call you in 1-2 weeks with the results of the testing.  - In the meantime, start Atrovent (ipratropium) one spray per nostril every 6 hours as needed.  3. Return in about 3 months (around 05/11/2018).  Subjective:   Madeline REDDICKis a 84y.o. female presenting today for evaluation of  Chief Complaint  Patient presents with  . Facial Swelling    since 2006    Madeline Burke has a history of the following: Patient Active Problem List   Diagnosis Date Noted  . Angio-edema 02/08/2018    History obtained from: chart review and patient.  Madeline Burke was referred by VLeeroy Cha MD.     MKendrahis a 84y.o. female presenting for an evaluation of facial swelling. This has been ongoing for several years. Symptoms started in 2006. She reports that she has tongue swelling that does cause itching on some occasions. She also endorses some sleepiness with this. She will "ballooning" of her lips during these episodes. These reactions typically occur in the middle of the night. She will take Benadryl which does provide improvement within hours, but less than overnight. She does report some increasing urination during these episode, but no diarrhea. She will have some abdominal pain occasionally.   She was evaluated in our clinic in 2006 for these episodes. Evidently she had blood work that was "negative", per the patient. We do  not have these records any longer. She was also evaluated at [facility] for these episodes around the same time. There are no notes in Care Everywhere. 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 Her history with stinging insects is rather vague. She did have an EpiPen at one point, but it was always expire before using it, so she would stop getting it because the cost kept increasing.   She had an episode in 2016 that resulted in a hospital for these symptoms. This was in [place]. She has  been seen in the ED on multiple occasions for these symptoms.  An ER visit in April 2017 notes tongue edema and swelling of the submental region.  An ER visit from January 2016 notes moderate submental tissue edema but without stridor or breathing problems.  Her most recent ER visit in July 2019 notes only tongue swelling with angioedema.   In 2017 in October, she was put on some medicine that seemed to help the symptoms (she is unsure what this medicine was). Then she was stable until Mother's Day 2019 when the symptoms started again. She was placed on prednisone to help with the swelling. She feels that the prednisone did help.  She has been placed on Claritin daily since July 2019.  She is unsure if this is done anything.  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  An ER visit from November 2013 notes swelling of her right hand as well as a rash on her neck, but otherwise no systemic symptoms.  She was never tested for stinging insects.  Her uncle would have similar reactions. Her uncle would treat this with roots from tree in the woods. This is all passed down from her mother, so she is unsure of the details. Otherwise, there is no family history of any similar symptoms occurring in anyone else.    Ms. Madeline Burke does report that she has clear rhinorrhea recently. She odes not use a nose spray. She describes the discharge as clear. She denies sneezing or ocular symptoms.    Otherwise, there is no history of other atopic diseases, including asthma, drug allergies, food allergies, or environmental allergies. There is no significant infectious history. Vaccinations are up to date.    Past Medical History: Patient Active Problem List   Diagnosis Date Noted  . Angio-edema 02/08/2018    Medication List:  Allergies as of 02/08/2018      Reactions   Aspirin Hives   Bee Venom Hives   Famotidine Other (See Comments)   "mouth irritated" Other reaction(s): Other (See Comments) "mouth  irritated"   Fish Allergy Hives   Lisinopril Cough   Other reaction(s): Cough (ALLERGY/intolerance)   Penicillins Hives   Shellfish Allergy Hives   Sulfa Antibiotics Hives   Sulfasalazine Hives   Flagyl [metronidazole] Rash      Medication List        Accurate as of 02/08/18 10:31 AM. Always use your most recent med list.          amLODipine 2.5 MG tablet Commonly known as:  NORVASC Take 2.5 mg by mouth daily.   carvedilol 12.5 MG tablet Commonly known as:  COREG Take 12.5 mg by mouth 2 (two) times daily.   citalopram 10 MG tablet Commonly known as:  CELEXA Take 10 mg by mouth daily.   cycloSPORINE 0.05 % ophthalmic emulsion Commonly known as:  RESTASIS Place 1 drop into both eyes 2 (two)  times daily.   multivitamin with minerals Tabs tablet Take 1 tablet by mouth daily.   NAMENDA XR 28 MG Cp24 24 hr capsule Generic drug:  memantine Take 28 mg by mouth daily.   pioglitazone-metformin 15-850 MG tablet Commonly known as:  ACTOPLUS MET Take 1 tablet by mouth daily.   rosuvastatin 10 MG tablet Commonly known as:  CRESTOR Take 10 mg by mouth daily.   Vitamin D3 2000 units Tabs Take 2,000 Units by mouth daily.       Birth History: non-contributory.   Developmental History: non-contributory.   Past Surgical History: Past Surgical History:  Procedure Laterality Date  . ABDOMINAL HYSTERECTOMY  1997  . BREAST CYST EXCISION Right 1997  . VENOUS ABLATION  04/06/2013   right leg      Family History: Family History  Problem Relation Age of Onset  . Diabetes Other   . Hyperlipidemia Other   . Hypertension Other      Social History: Keirah lives at home by herself.  She lives in a house that was built in the 1950s.  There is some carpeting in the main living areas with hardwoods in the bedrooms.  She has gas heating and central cooling.  There are no pets in the home.  She does have dust mite covers on her bedding.  There is no tobacco exposure.  She is  retired, but previously worked in the [employer].  She has 3 daughters, all of whom are in the healthcare field and live in the surrounding areas.  She has lived in her current house since the 23s, but grew up in [place].    Review of Systems: a 14-point review of systems is pertinent for what is mentioned in HPI.  Otherwise, all other systems were negative. Constitutional: negative other than that listed in the HPI Eyes: negative other than that listed in the HPI Ears, nose, mouth, throat, and face: negative other than that listed in the HPI Respiratory: negative other than that listed in the HPI Cardiovascular: negative other than that listed in the HPI Gastrointestinal: negative other than that listed in the HPI Genitourinary: negative other than that listed in the HPI Integument: negative other than that listed in the HPI Hematologic: negative other than that listed in the HPI Musculoskeletal: negative other than that listed in the HPI Neurological: negative other than that listed in the HPI Allergy/Immunologic: negative other than that listed in the HPI    Objective:   Blood pressure 112/62, pulse 60, resp. rate 16, height 5' 2"  (1.575 m), weight 154 lb 3.2 oz (69.9 kg), SpO2 98 %. Body mass index is 28.2 kg/m.   Physical Exam:  General: Alert, interactive, in no acute distress.  Pleasant boisterous female Eyes: No conjunctival injection bilaterally, no discharge on the right, no discharge on the left and no Horner-Trantas dots present. PERRL bilaterally. EOMI without pain. No photophobia.  Ears: Right TM pearly gray with normal light reflex, Left TM pearly gray with normal light reflex, Right TM intact without perforation and Left TM intact without perforation.  Nose/Throat: External nose within normal limits and septum midline. Turbinates edematous and pale with clear discharge. Posterior oropharynx erythematous without cobblestoning in the posterior  oropharynx. Tonsils 2+ without exudates.  Tongue without thrush. Neck: Supple without thyromegaly. Trachea midline. Adenopathy: no enlarged lymph nodes appreciated in the anterior cervical, occipital, axillary, epitrochlear, inguinal, or popliteal regions. Lungs: Clear to auscultation without wheezing, rhonchi or rales. No increased work of breathing.  CV: Normal S1/S2. No murmurs. Capillary refill <2 seconds.  Abdomen: Nondistended, nontender. No guarding or rebound tenderness. Bowel sounds present in all fields and hypoactive  Skin: Warm and dry, without lesions or rashes. Extremities:  No clubbing, cyanosis or edema. Neuro:   Grossly intact. No focal deficits appreciated. Responsive to questions.  Diagnostic studies: none (multiple labs collected)      Salvatore Marvel, MD Allergy and [facility]

## 2018-02-08 NOTE — Patient Instructions (Addendum)
1. Angioedema, subsequent encounter - We will get a lot of labs today to look for serious causes of hives and swelling. - We will call you in 1-2 weeks with the results of the testing. - Stop the loratadine since this is a weak antihistamine.  - I would like to put you on a daily antihistamine: Allegra (fexofenadine) 180mg  once daily  2. Rhinorrhea - We will get an environmental allergy panel to see if there are any allergic triggers.  - We will call you in 1-2 weeks with the results of the testing.  - In the meantime, start Atrovent (ipratropium) one spray per nostril every 6 hours as needed.  3. Return in about 3 months (around 05/11/2018).   Please inform us of any Emergency Department visits, hospitalizations, or changes in symptoms. Call us before going to the ED for breathing or allergy symptoms since we might be able to fit you in for a sick visit. Feel free to contact us anytime with any questions, problems, or concerns.  It was a pleasure to meet you today! YOU are an absolute delight!   Websites that have reliable patient information: 1. American Academy of Asthma, Allergy, and Immunology: www.aaaai.org 2. Food Allergy Research and Education (FARE): foodallergy.org 3. Mothers of Asthmatics: http://www.asthmacommunitynetwork.org 4. American College of Allergy, Asthma, and Immunology: MonthlyElectricBill.co.uk   Make sure you are registered to vote! If you have moved or changed any of your contact information, you will need to get this updated before voting!

## 2018-02-13 LAB — IGE+ALLERGENS ZONE 2(30)
Alternaria Alternata IgE: <0.1 kU/L
Aspergillus Fumigatus IgE: <0.1 kU/L
Bahia Grass IgE: <0.1 kU/L
Bermuda Grass IgE: <0.1 kU/L
Cat Dander IgE: <0.1 kU/L
Cedar, Mountain IgE: <0.1 kU/L
Cockroach, American IgE: <0.1 kU/L
D Farinae IgE: <0.1 kU/L
Dog Dander IgE: <0.1 kU/L
Hickory, White IgE: <0.1 kU/L
IGE (IMMUNOGLOBULIN E), SERUM: 21 [IU]/mL (ref 6–495)
Maple/Box Elder IgE: <0.1 kU/L
Mucor Racemosus IgE: <0.1 kU/L
Nettle IgE: <0.1 kU/L
Oak, White IgE: <0.1 kU/L
Penicillium Chrysogen IgE: <0.1 kU/L
Sheep Sorrel IgE Qn: <0.1 kU/L
Sweet gum IgE RAST Ql: <0.1 kU/L
White Mulberry IgE: <0.1 kU/L

## 2018-02-13 LAB — ALLERGY PANEL 19, SEAFOOD GROUP
Allergen Salmon IgE: <0.1 kU/L
Codfish IgE: <0.1 kU/L
F023-IgE Crab: <0.1 kU/L

## 2018-02-13 LAB — ALLERGEN STINGING INSECT PANEL
Honeybee IgE: <0.1 kU/L
Hornet, White Face, IgE: <0.1 kU/L
Hornet, Yellow, IgE: <0.1 kU/L
I004-IGE PAPER WASP: 0.34 kU/L — AB
Yellow Jacket, IgE: 0.45 kU/L — AB

## 2018-02-13 LAB — TRYPTASE: TRYPTASE: 9.5 ug/L (ref 2.2–13.2)

## 2018-02-13 LAB — C1 ESTERASE INHIBITOR: C1INH SerPl-mCnc: 47 mg/dL — ABNORMAL HIGH (ref 21–39)

## 2018-02-13 LAB — C1 ESTERASE INHIBITOR, FUNCTIONAL: C1INH Functional/C1INH Total MFr SerPl: >92 %mean normal

## 2018-02-13 LAB — ALPHA-GAL PANEL
Alpha Gal IgE*: <0.1 kU/L (ref <0.10)
BEEF CLASS INTERPRETATION: 0
Class Interpretation: 0
Class Interpretation: 0
Pork (Sus spp) IgE: <0.1 kU/L (ref <0.35)

## 2018-02-23 MED ORDER — EPINEPHRINE 0.3 MG/0.3ML IJ SOAJ: 0.3000 mg | Freq: Once | INTRAMUSCULAR | 1 refills | Status: AC

## 2018-02-23 NOTE — Addendum Note (Signed)
Addended by: Valere Dross on: 02/23/2018 11:41 AM   Modules accepted: Orders

## 2018-03-21 ENCOUNTER — Other Ambulatory Visit: Payer: Self-pay | Admitting: Allergy & Immunology

## 2018-03-21 ENCOUNTER — Other Ambulatory Visit: Payer: Self-pay | Admitting: *Deleted

## 2018-03-21 MED ORDER — FEXOFENADINE HCL 180 MG PO TABS: 180.0000 mg | ORAL_TABLET | Freq: Every day | ORAL | 1 refills | Status: DC

## 2018-03-21 NOTE — Telephone Encounter (Signed)
Called and informed patient that Rx has been sent in. 

## 2018-03-21 NOTE — Telephone Encounter (Signed)
Patient stopped by today to ask if she is still supposed to be taking fexofenadine. She said she was given a 30 day supply (it has 5 refills) but she would like a 90 day supply.

## 2018-04-06 ENCOUNTER — Other Ambulatory Visit: Payer: Self-pay | Admitting: Internal Medicine

## 2018-04-06 DIAGNOSIS — M8588 Other specified disorders of bone density and structure, other site: Secondary | ICD-10-CM

## 2018-05-09 ENCOUNTER — Ambulatory Visit (INDEPENDENT_AMBULATORY_CARE_PROVIDER_SITE_OTHER): Payer: Medicare Other | Admitting: Allergy & Immunology

## 2018-05-09 DIAGNOSIS — T783XXD Angioneurotic edema, subsequent encounter: Secondary | ICD-10-CM

## 2018-05-09 DIAGNOSIS — J3489 Other specified disorders of nose and nasal sinuses: Secondary | ICD-10-CM

## 2018-05-09 MED ORDER — FEXOFENADINE HCL 180 MG PO TABS: 180.0000 mg | ORAL_TABLET | Freq: Every day | ORAL | 5 refills | Status: DC

## 2018-05-09 NOTE — Patient Instructions (Addendum)
1. Angioedema, subsequent encounter - Be sure to start the Allegra (fexofenadine) 180mg  once daily (this is the stronger than the Claritin). - EpiPen script is at the pharmacy if you decide to get it.  - Stinging insect panel was positive to paper wasp and yellow jacket.  - We can do the most common foods if you want.   2. Return in about 6 months (around 11/07/2018).   Please inform us of any Emergency Department visits, hospitalizations, or changes in symptoms. Call us before going to the ED for breathing or allergy symptoms since we might be able to fit you in for a sick visit. Feel free to contact us anytime with any questions, problems, or concerns.  It was a pleasure to see you again today!   Websites that have reliable patient information: 1. American Academy of Asthma, Allergy, and Immunology: www.aaaai.org 2. Food Allergy Research and Education (FARE): foodallergy.org 3. Mothers of Asthmatics: http://www.asthmacommunitynetwork.org 4. American College of Allergy, Asthma, and Immunology: MonthlyElectricBill.co.uk   Make sure you are registered to vote! If you have moved or changed any of your contact information, you will need to get this updated before voting!

## 2018-05-09 NOTE — Progress Notes (Signed)
FOLLOW UP  Date of Service/Encounter:  05/09/18   Assessment:   Recurrent angioedema - with normal workup  Rhinorrhea   Hidaya continues to have episodes of swelling, which occur around once per month.  She is able to treat them at home with Benadryl to avoid any hospitalizations.  She is still does not notice any kind of trigger.  They typically occur at the end of the day of her hours after eating dinner.  They last for 2 to 3 days.  While there is a strong family history of swelling, all of her hereditary angioedema labs were normal.  We could consider doing a factor XII mutation analysis, since this can be associated with type III hereditary angioedema.  However, we will hold off on this will prevent these episodes.  The fact that this responds well to Benadryl makes hereditary angioedema less likely anyway.  She will continue to take a diary and report any triggering foods or exposures.  Plan/Recommendations:   1. Angioedema - Be sure to start the Allegra (fexofenadine) 180mg  once daily (this is the stronger than the Claritin). - EpiPen script is at the pharmacy if you decide to get it.  - Stinging insect panel was positive to paper wasp and yellow jacket.  - We can do the most common foods if you want.   2. Return in about 6 months (around 11/07/2018).   Subjective:   BRECK HOLLINGER is a 84 y.o. female presenting today for follow up of  Chief Complaint  Patient presents with  . Follow-up    everthing is fine since she's been off ipratropium, swelling in mouth from last visit August 2019 until now     [name] has a history of the following: Patient Active Problem List   Diagnosis Date Noted  . Angio-edema 02/08/2018    History obtained from: chart review and patient.  Lonie Peak Cabacungan's Primary Care Provider is Seward Carol, MD.     Sandy is a 84 y.o. female presenting for a follow up visit. She was last seen in August 2019 as a New Patient. At that time, we  did a lot of labs to look for serious causes of hives and swelling. We stopped her Claritin and started Allegra 180mg  once daily. She was endorsing rhinorrhea so we obtained an environmental allergy testing on her labs. We started Atrovent one spray per nostril every 6 hours to help with the rhinorrhea. This workup was completely normal aside from a slightly elevated IgE to yellow jacket and paper wasp.   Since the last visit, she has continued to have problems. She had one on the first week of September. THis one lasted a couple of days and resolved with Benadryl. Then she had another one this month that lasted a couple of days as well. Each of the episodes happened at night. Most of the time that it happens is at night. These occur long after she eats dinner. She is not taking Allegra every day.   There was a strong family history of swelling.  However, no one has died from the swelling episodes and they do include even the males in the family.  Although Jany clearly does not have any menstrual cycles at this time, she never noticed an association with them in the past.   Otherwise, there have been no changes to her past medical history, surgical history, family history, or social history.    Review of Systems: a 14-point review of systems  is pertinent for what is mentioned in HPI.  Otherwise, all other systems were negative.  Constitutional: negative other than that listed in the HPI Eyes: negative other than that listed in the HPI Ears, nose, mouth, throat, and face: negative other than that listed in the HPI Respiratory: negative other than that listed in the HPI Cardiovascular: negative other than that listed in the HPI Gastrointestinal: negative other than that listed in the HPI Genitourinary: negative other than that listed in the HPI Integument: negative other than that listed in the HPI Hematologic: negative other than that listed in the HPI Musculoskeletal: negative other than that  listed in the HPI Neurological: negative other than that listed in the HPI Allergy/Immunologic: negative other than that listed in the HPI    Objective:   Vitals all within normal limits.   Physical Exam:  General: Alert, interactive, in no acute distress. Pleasant female.  Eyes: No conjunctival injection bilaterally, no discharge on the right, no discharge on the left, no Horner-Trantas dots present and allergic shiners present bilaterally. PERRL bilaterally. EOMI without pain. No photophobia.  Ears: Right TM pearly gray with normal light reflex, Left TM pearly gray with normal light reflex, Right TM intact without perforation and Left TM intact without perforation.  Nose/Throat: External nose within normal limits and septum midline. Turbinates edematous and pale with clear discharge. Posterior oropharynx erythematous without cobblestoning in the posterior oropharynx. Tonsils 2+ without exudates.  Tongue without thrush. Lungs: Clear to auscultation without wheezing, rhonchi or rales. No increased work of breathing. CV: Normal S1/S2. No murmurs. Capillary refill <2 seconds.  Skin: Warm and dry, without lesions or rashes. Neuro:   Grossly intact. No focal deficits appreciated. Responsive to questions.  Diagnostic studies: none     Salvatore Marvel, MD  [facility] and [place] [facility]

## 2018-05-10 ENCOUNTER — Encounter: Payer: Self-pay | Admitting: Allergy & Immunology

## 2018-06-09 ENCOUNTER — Other Ambulatory Visit: Payer: Medicare Other

## 2018-07-05 ENCOUNTER — Ambulatory Visit
Admission: RE | Admit: 2018-07-05 | Discharge: 2018-07-05 | Disposition: A | Discharge status: Home / Self Care | Payer: Medicare Other | Source: Ambulatory Visit | Attending: Internal Medicine | Admitting: Internal Medicine

## 2018-07-05 DIAGNOSIS — M8588 Other specified disorders of bone density and structure, other site: Secondary | ICD-10-CM

## 2018-07-10 ENCOUNTER — Other Ambulatory Visit: Payer: Self-pay | Admitting: Internal Medicine

## 2018-11-07 ENCOUNTER — Ambulatory Visit: Payer: Medicare Other | Admitting: Allergy & Immunology

## 2019-01-02 ENCOUNTER — Encounter: Payer: Self-pay | Admitting: Allergy & Immunology

## 2019-01-02 ENCOUNTER — Other Ambulatory Visit: Payer: Self-pay

## 2019-01-02 ENCOUNTER — Ambulatory Visit (INDEPENDENT_AMBULATORY_CARE_PROVIDER_SITE_OTHER): Payer: Medicare Other | Admitting: Allergy & Immunology

## 2019-01-02 VITALS — BP 126/78 | HR 59 | Temp 98.0°F | Resp 16 | Ht 62.0 in | Wt 153.8 lb

## 2019-01-02 DIAGNOSIS — J3489 Other specified disorders of nose and nasal sinuses: Secondary | ICD-10-CM | POA: Diagnosis not present

## 2019-01-02 DIAGNOSIS — T783XXD Angioneurotic edema, subsequent encounter: Secondary | ICD-10-CM | POA: Diagnosis not present

## 2019-01-02 DIAGNOSIS — L299 Pruritus, unspecified: Secondary | ICD-10-CM

## 2019-01-02 MED ORDER — TRIAMCINOLONE ACETONIDE 0.1 % EX CREA: 1.0000 "application " | TOPICAL_CREAM | Freq: Two times a day (BID) | CUTANEOUS | 0 refills | Status: DC

## 2019-01-02 NOTE — Patient Instructions (Addendum)
1. Angioedema - unknown trigger - Continue with Allegra, but increase to one tablet twice daily when you have these itching episodes. - Add on triamcinolone 0.1% ointment twice daily as needed for the itching/rash. - EpiPen is up to date.  - Continue to avoid stinging insects.   2. Return in about 1 year (around 01/02/2020). This can be an in-person, a virtual Webex or a telephone follow up visit.   Please inform us of any Emergency Department visits, hospitalizations, or changes in symptoms. Call us before going to the ED for breathing or allergy symptoms since we might be able to fit you in for a sick visit. Feel free to contact us anytime with any questions, problems, or concerns.  It was a pleasure to see you again today! Good luck with the tomatoes!   Websites that have reliable patient information: 1. American Academy of Asthma, Allergy, and Immunology: www.aaaai.org 2. Food Allergy Research and Education (FARE): foodallergy.org 3. Mothers of Asthmatics: http://www.asthmacommunitynetwork.org 4. American College of Allergy, Asthma, and Immunology: www.acaai.org  "Like" [place] on Facebook and Instagram for our latest updates!      Make sure you are registered to vote! If you have moved or changed any of your contact information, you will need to get this updated before voting!  In some cases, you MAY be able to register to vote online: [email or web address]    Voter ID laws are NOT going into effect for the General Election in November 2020! DO NOT let this stop you from exercising your right to vote!   Absentee voting is the SAFEST way to vote during the coronavirus pandemic!   Download and print an absentee ballot request form at [email or web address] or you can scan the QR code below with your smart phone:      More information on absentee ballots can be found here: [email or web address]

## 2019-01-02 NOTE — Progress Notes (Signed)
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 Angioedema - unknown trigger - Continue with Allegra, but increase to one tablet twice daily when you have these itching episodes. - Add on triamcinolone 0.1% ointment twice daily as needed for the itching/rash. - EpiPen is up to date.  - Reviewed indications on how and when to use EpiPen. - Continue to avoid stinging insects.   2. Return in about 1 year (around 01/02/2020). This can be an in-person, a virtual Webex or a telephone follow up visit.   Subjective:   Madeline Burke is a 85 y.o. female presenting today for follow up of  Chief Complaint  Patient presents with  . Follow-up    angiodema, no complaints with that     Madeline Burke has a history of the following: Patient Active Problem List   Diagnosis Date Noted  . Angio-edema 02/08/2018    History obtained from: chart review and patient.  Madeline Burke is a 85 y.o. female presenting for a follow up visit.  She was last seen in November 2018.  At that time, she was continuing to have episodes of swelling which were occurring around once per month.  She has been able to treat them at home with Benadryl with resolution.  There is a strong family history of swelling, but her HAE work-up was normal.  We recommended that she continue to use a food diary and report any triggering foods.  We recommended starting Allegra 1 tablet daily to see if this would suppress the episodes.  She did have a stinging insect panel that was positive to paper wasp and yellow jacket although these were low at 0.34 and 0.45 respectively.  We did make sure that her EpiPen was up-to-date.  Since last visit, she has done well.  She reports no swelling episodes since July 2019.  Her EpiPen is up-to-date.  She has not needed to use her EpiPen at all.  She has noted no new  triggers for her symptoms.  She is able to tolerate a wide variety of foods without adverse event.  She does report some itching for the last 2 weeks.  She also describes a rash on the back of her neck, which is difficult to appreciate today.  She denies any new exposures.  She has been using Benadryl which does take the edge off the itching.  She remains on the Allegra 1 tablet daily.  She has not tried increasing to 1 tablet twice daily.  She does not have any kind of ointment that she uses for the itch, but she continues to use her daily lotion for moisturizing.  She cannot remember the name of it but has not changed since starting.  She does tell me today that her family is driving her crazy.  Apparently there are overprotective with regards to her exposures.  However, she is rather homebody anyway and likes to spend time outdoors in her garden.  Her family does not want her to be doing even that.  Otherwise, there have been no changes to her past medical history, surgical history, family history, or social history.    Review of Systems  Constitutional: Negative.  Negative for chills, fever, malaise/fatigue and weight loss.  HENT: Negative.  Negative for congestion, ear discharge, ear pain and sore throat.   Eyes: Negative for pain, discharge and  redness.  Respiratory: Negative for cough, sputum production, shortness of breath and wheezing.   Cardiovascular: Negative.  Negative for chest pain and palpitations.  Gastrointestinal: Negative for abdominal pain, heartburn, nausea and vomiting.  Skin: Positive for itching and rash.  Neurological: Negative for dizziness and headaches.  Endo/Heme/Allergies: Negative for environmental allergies. Does not bruise/bleed easily.       Objective:   Blood pressure 126/78, pulse (!) 59, temperature 98 F (36.7 C), resp. rate 16, height 5\' 2"  (1.575 m), weight 153 lb 12.8 oz (69.8 kg), SpO2 95 %. Body mass index is 28.13 kg/m.   Physical Exam:   Physical Exam  Constitutional: She appears well-developed.  Adorable female.  Very talkative.  HENT:  Head: Normocephalic and atraumatic.  Right Ear: Tympanic membrane, external ear and ear canal normal.  Left Ear: Tympanic membrane, external ear and ear canal normal.  Nose: Mucosal edema and rhinorrhea present. No nasal deformity or septal deviation. No epistaxis. Right sinus exhibits no maxillary sinus tenderness and no frontal sinus tenderness. Left sinus exhibits no maxillary sinus tenderness and no frontal sinus tenderness.  Mouth/Throat: Uvula is midline and oropharynx is clear and moist. Mucous membranes are not pale and not dry.  Cobblestoning present in the posterior oropharynx.  Eyes: Pupils are equal, round, and reactive to light. Conjunctivae and EOM are normal. Right eye exhibits no chemosis and no discharge. Left eye exhibits no chemosis and no discharge. Right conjunctiva is not injected. Left conjunctiva is not injected.  Cardiovascular: Normal rate, regular rhythm and normal heart sounds.  Respiratory: Effort normal and breath sounds normal. No accessory muscle usage. No tachypnea. No respiratory distress. She has no wheezes. She has no rhonchi. She has no rales. She exhibits no tenderness.  Lymphadenopathy:    She has no cervical adenopathy.  Neurological: She is alert.  Skin: No abrasion, no petechiae and no rash noted. Rash is not papular, not vesicular and not urticarial. No erythema. No pallor.  There are some excoriations present in the back neck, although no overt rash appreciated.  Psychiatric: She has a normal mood and affect.      Diagnostic studies: none     Salvatore Marvel, MD  [facility]

## 2019-04-27 ENCOUNTER — Other Ambulatory Visit: Payer: Self-pay | Admitting: Internal Medicine

## 2019-04-27 ENCOUNTER — Ambulatory Visit
Admission: RE | Admit: 2019-04-27 | Discharge: 2019-04-27 | Disposition: A | Discharge status: Home / Self Care | Payer: Medicare Other | Source: Ambulatory Visit | Attending: Internal Medicine | Admitting: Internal Medicine

## 2019-04-27 DIAGNOSIS — M62830 Muscle spasm of back: Secondary | ICD-10-CM

## 2019-04-27 DIAGNOSIS — R1011 Right upper quadrant pain: Secondary | ICD-10-CM

## 2019-04-27 IMAGING — CR DG ABDOMEN 1V
1 series · 1 of 1 positions shown · non-contrast
Comparison: None.

CLINICAL DATA: Right upper quadrant pain for 1 month

EXAM:
ABDOMEN - 1 VIEW

[t abdomen supine]
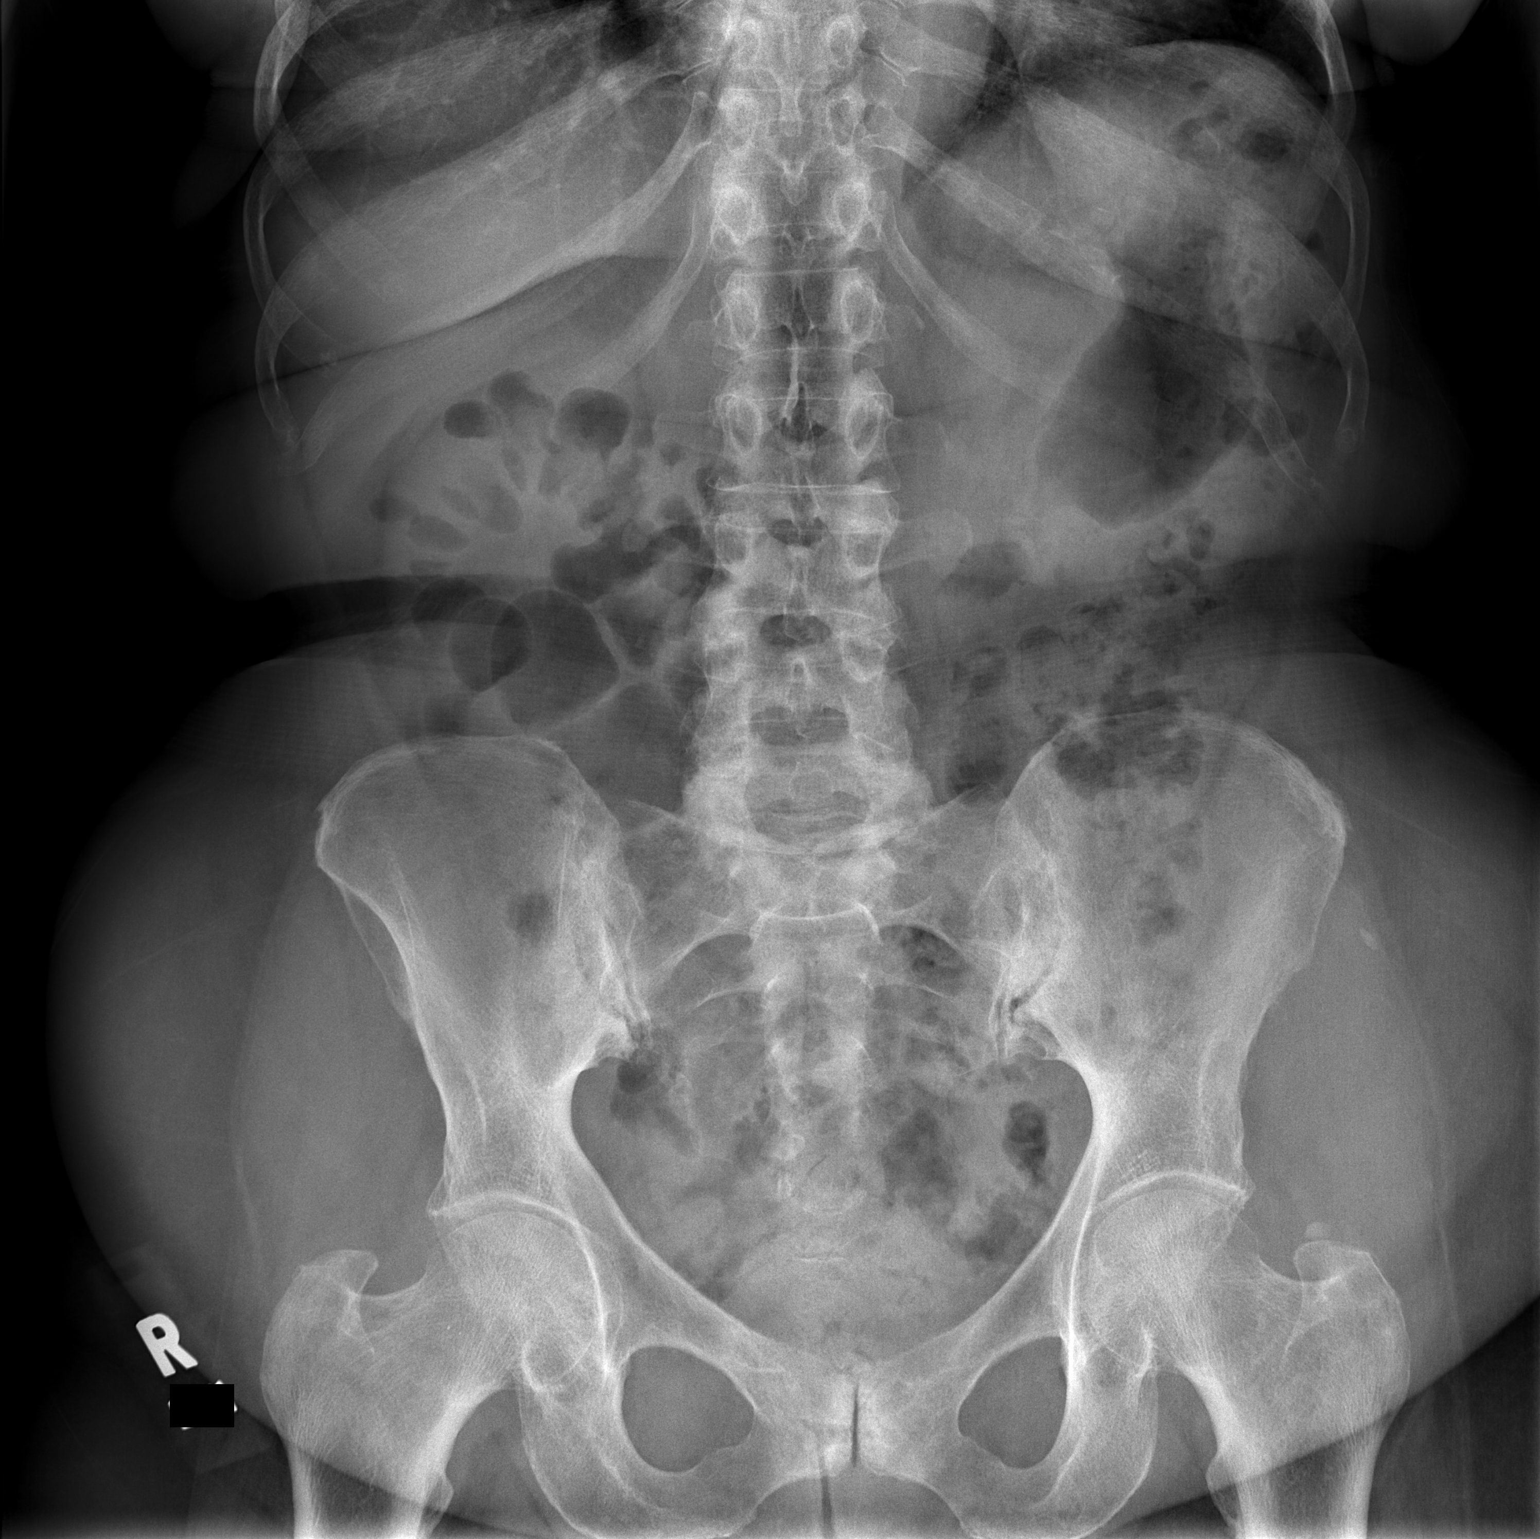

[1 of 1 positions shown; findings below may reference images not displayed]

FINDINGS: Nonobstructed bowel-gas pattern with moderate stool. No radiopaque
calculi.
IMPRESSION: Negative.

## 2019-04-27 IMAGING — CR DG LUMBAR SPINE COMPLETE 4+V
5 series · 5 of 5 positions shown · non-contrast
Comparison: Abdominal radiograph [DATE]

CLINICAL DATA: Back muscle spasm, low back pain for 1 month after
fall

EXAM:
LUMBAR SPINE - COMPLETE 4+ VIEW

[t l-spine a.p.]
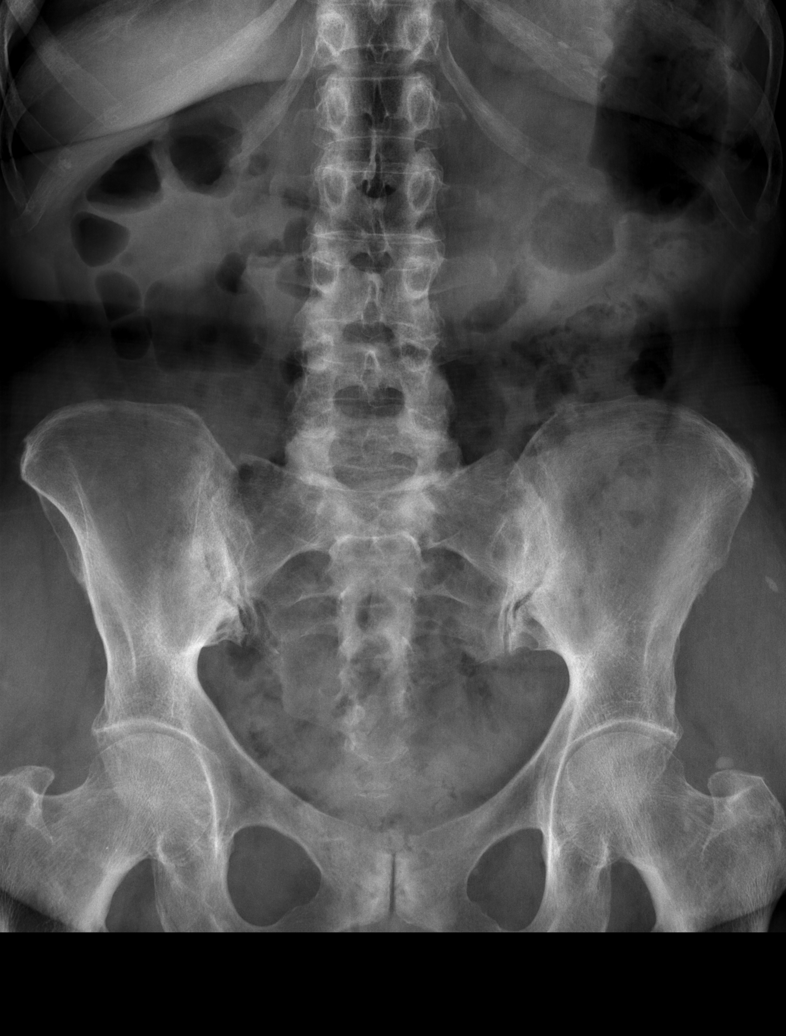

[t l-spine oblique exposure (1 of 2)]
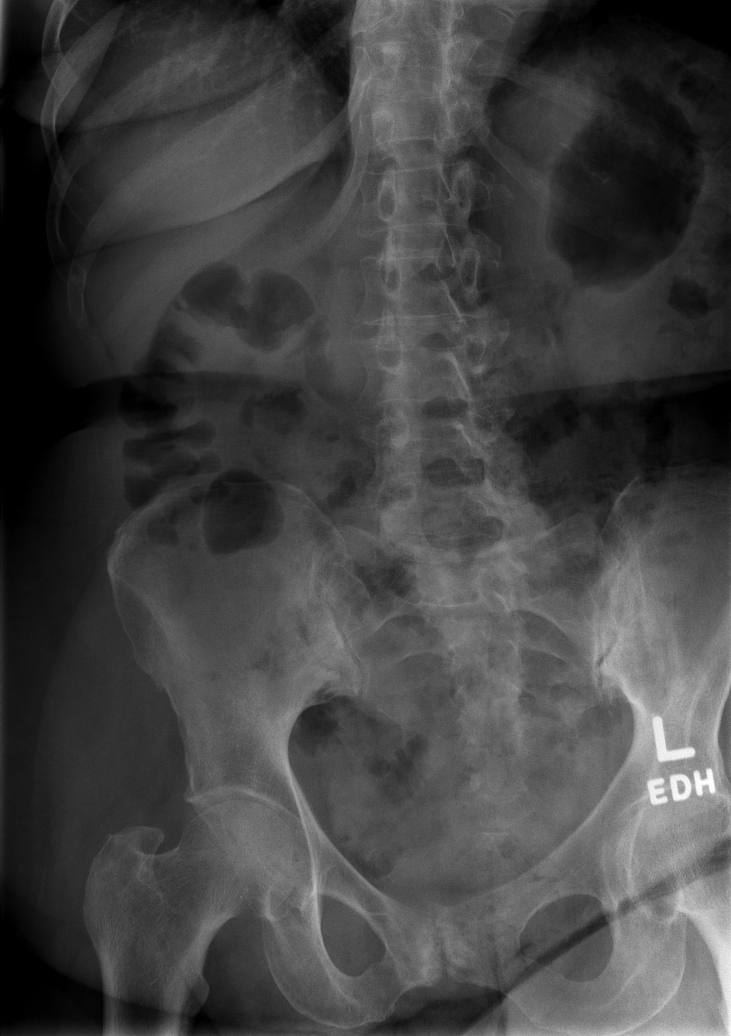

[t l-spine oblique exposure (2 of 2)]
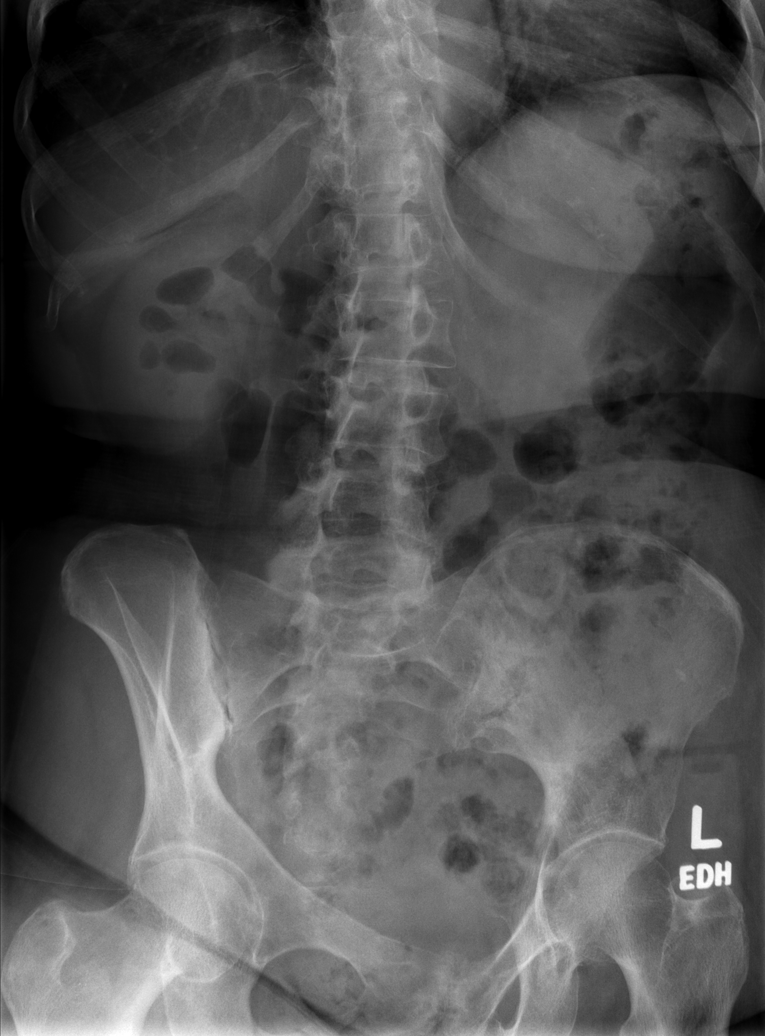

[t l-spine lat]
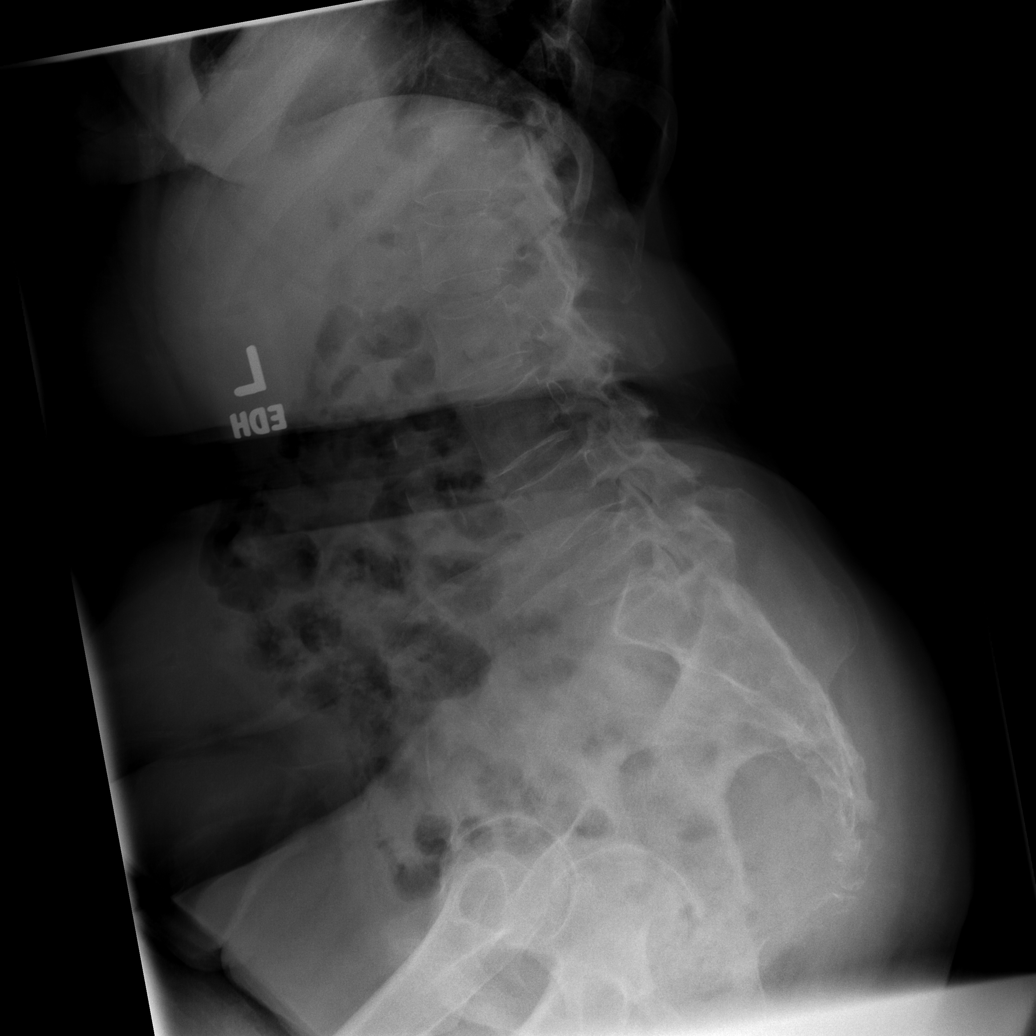

[t l-spine l5-s1 spot]
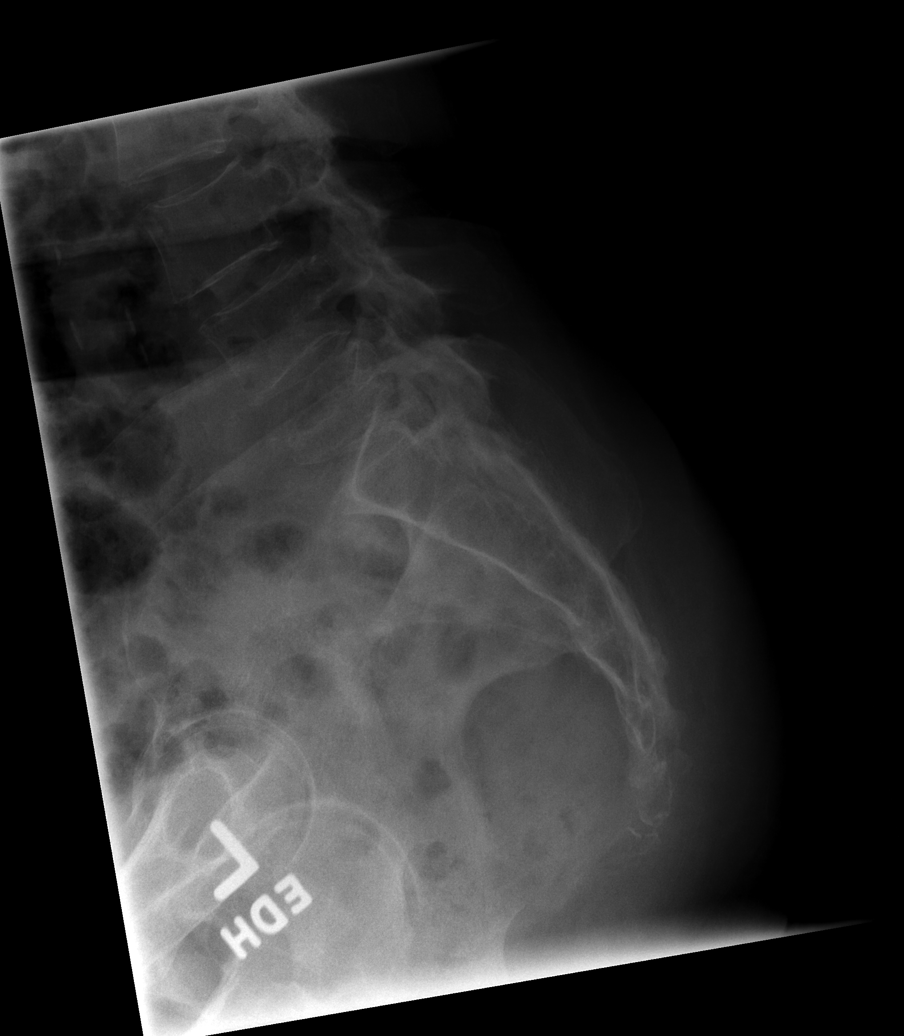

[5 of 5 positions shown; findings below may reference images not displayed]

FINDINGS: Five lumbar type vertebrae. Question an anterior wedging deformity
of the inferior endplate of L5, difficult to visualize given
degraded imaging quality secondary to body habitus. Multilevel
discogenic and facet degenerative changes are present throughout the
spine most pronounced in the lower lumbar levels. Vascular calcium
is noted throughout the abdomen. Bowel gas pattern is unremarkable.
Sclerotic degenerative changes are noted at the SI joints and
symphysis pubis. Femoral heads remain normally located.
IMPRESSION: 1. Question an anterior wedging deformity of the inferior endplate
of L5, difficult to visualize given degraded imaging quality
secondary to body habitus. Consider cross-sectional imaging.
2. Multilevel degenerative changes throughout the spine most
pronounced in the lower lumbar levels.

## 2019-05-13 ENCOUNTER — Encounter (HOSPITAL_COMMUNITY): Payer: Self-pay | Admitting: Emergency Medicine

## 2019-05-13 ENCOUNTER — Other Ambulatory Visit: Payer: Self-pay

## 2019-05-13 ENCOUNTER — Emergency Department (HOSPITAL_COMMUNITY)
Admission: EM | Admit: 2019-05-13 | Discharge: 2019-05-13 | Disposition: A | Discharge status: Home / Self Care | Payer: Medicare Other | Attending: Emergency Medicine | Admitting: Emergency Medicine

## 2019-05-13 DIAGNOSIS — I10 Essential (primary) hypertension: Secondary | ICD-10-CM | POA: Diagnosis not present

## 2019-05-13 DIAGNOSIS — T783XXA Angioneurotic edema, initial encounter: Secondary | ICD-10-CM | POA: Diagnosis not present

## 2019-05-13 DIAGNOSIS — E119 Type 2 diabetes mellitus without complications: Secondary | ICD-10-CM | POA: Diagnosis not present

## 2019-05-13 DIAGNOSIS — Z79899 Other long term (current) drug therapy: Secondary | ICD-10-CM | POA: Diagnosis not present

## 2019-05-13 LAB — BASIC METABOLIC PANEL
Anion gap: 11 (ref 5–15)
BUN: 19 mg/dL (ref 8–23)
CO2: 25 mmol/L (ref 22–32)
Calcium: 9.6 mg/dL (ref 8.9–10.3)
Chloride: 104 mmol/L (ref 98–111)
Creatinine, Ser: 1 mg/dL (ref 0.44–1.00)
GFR calc Af Amer: >60 mL/min (ref >60)
GFR calc non Af Amer: 52 mL/min — ABNORMAL LOW (ref >60)
Glucose, Bld: 101 mg/dL — ABNORMAL HIGH (ref 70–99)
Potassium: 3.9 mmol/L (ref 3.5–5.1)
Sodium: 140 mmol/L (ref 135–145)

## 2019-05-13 LAB — CBC
HCT: 38.7 % (ref 36.0–46.0)
Hemoglobin: 12.1 g/dL (ref 12.0–15.0)
MCH: 26 pg (ref 26.0–34.0)
MCHC: 31.3 g/dL (ref 30.0–36.0)
MCV: 83 fL (ref 80.0–100.0)
Platelets: DECREASED 10*3/uL (ref 150–400)
RBC: 4.66 MIL/uL (ref 3.87–5.11)
RDW: 14.9 % (ref 11.5–15.5)
WBC: 4.4 10*3/uL (ref 4.0–10.5)
nRBC: 0 % (ref 0.0–0.2)

## 2019-05-13 MED ORDER — METHYLPREDNISOLONE SODIUM SUCC 125 MG IJ SOLR
125.0000 mg | Freq: Once | INTRAMUSCULAR | Status: AC
  Administered 2019-05-13: 125 mg via INTRAVENOUS
  Filled 2019-05-13: qty 2

## 2019-05-13 MED ORDER — PREDNISONE 10 MG PO TABS: 40.0000 mg | ORAL_TABLET | Freq: Every day | ORAL | 0 refills | Status: AC

## 2019-05-13 MED ORDER — DIPHENHYDRAMINE HCL 50 MG/ML IJ SOLN
25.0000 mg | Freq: Once | INTRAMUSCULAR | Status: AC
  Administered 2019-05-13: 08:00:00 25 mg via INTRAVENOUS
  Filled 2019-05-13: qty 1

## 2019-05-13 NOTE — ED Triage Notes (Signed)
Pt c/o tongue swelling that started at 0300 this am. C/o mild shortness of breath, A&O x 4.

## 2019-05-13 NOTE — ED Notes (Signed)
Patient verbalizes understanding of discharge instructions. Opportunity for questioning and answers were provided. Armband removed by staff, pt discharged from ED.  

## 2019-05-13 NOTE — ED Provider Notes (Addendum)
TIME SEEN: 6:49 AM  CHIEF COMPLAINT: Angioedema  HPI: Patient is an 85 year old female with history of hypertension, diabetes, hyperlipidemia, history of angioedema since 2009 who presents to the emergency department with swelling of the tongue that woke her from sleep at 2 AM.  Took 50 mg of Benadryl prior to arrival but feels that there is no significant improvement.  States she feels like she is having a difficult time breathing, talking and speaking secondary to this.  States she has been told that this was hereditary in nature because her uncle has a history of the same.  It appears symptoms normally improved with Benadryl and Solu-Medrol.  Does not appear that she has received FFP complement inhibitors before.  Denies any new exposures including new soaps, lotions, detergents or other medications.  No hives.  Daughter at bedside reports the patient is on lisinopril.    ROS: See HPI Constitutional: no fever  Eyes: no drainage  ENT: no runny nose   Cardiovascular:  no chest pain  Resp: no SOB  GI: no vomiting GU: no dysuria Integumentary: no rash  Allergy: no hives  Musculoskeletal: no leg swelling  Neurological: no slurred speech ROS otherwise negative  PAST MEDICAL HISTORY/PAST SURGICAL HISTORY:  Past Medical History:  Diagnosis Date  . Diabetes mellitus without complication ([place])   . Elevated cholesterol   . History of angioedema 2006  . Hypertension     MEDICATIONS:  Prior to Admission medications   Medication Sig Start Date End Date Taking? Authorizing Provider  amLODipine (NORVASC) 2.5 MG tablet Take 2.5 mg by mouth daily. 01/04/18   [provider]  carvedilol (COREG) 12.5 MG tablet Take 12.5 mg by mouth 2 (two) times daily. 04/15/14   [provider]  Cholecalciferol (VITAMIN D3) 2000 units TABS Take 2,000 Units by mouth daily.    [provider]  citalopram (CELEXA) 10 MG tablet Take 10 mg by mouth daily. 04/15/14   [provider]   cycloSPORINE (RESTASIS) 0.05 % ophthalmic emulsion Place 1 drop into both eyes 2 (two) times daily.     [provider]  fexofenadine (ALLEGRA) 180 MG tablet Take 1 tablet (180 mg total) by mouth daily. 05/09/18 06/08/18  Valentina Shaggy, MD  Multiple Vitamin (MULTIVITAMIN WITH MINERALS) TABS tablet Take 1 tablet by mouth daily.    [provider]  NAMENDA XR 28 MG CP24 24 hr capsule Take 28 mg by mouth daily. 04/16/14   [provider]  naproxen (NAPROSYN) 250 MG tablet TAKE 1 TABLET WITH FOOD OR MILK TWICE A DAY ORALLY 10 DAYS 03/27/18   [provider]  pioglitazone-metformin (ACTOPLUS MET) 15-850 MG per tablet Take 1 tablet by mouth daily.     [provider]  rosuvastatin (CRESTOR) 10 MG tablet Take 10 mg by mouth daily.    [provider]  triamcinolone cream (KENALOG) 0.1 % Apply 1 application topically 2 (two) times daily. 01/02/19   Valentina Shaggy, MD    ALLERGIES:  Allergies  Allergen Reactions  . Aspirin Hives  . Bee Venom Hives  . Famotidine Other (See Comments)    "mouth irritated" Other reaction(s): Other (See Comments) "mouth irritated"  . Fish Allergy Hives  . Lisinopril Cough    Other reaction(s): Cough (ALLERGY/intolerance)  . Penicillins Hives  . Shellfish Allergy Hives  . Sulfa Antibiotics Hives  . Sulfasalazine Hives  . Flagyl [Metronidazole] Rash    SOCIAL HISTORY:  Social History   Tobacco Use  . Smoking  status: Never Smoker  . Smokeless tobacco: Never Used  Substance Use Topics  . Alcohol use: No    FAMILY HISTORY: Family History  Problem Relation Age of Onset  . Diabetes Other   . Hyperlipidemia Other   . Hypertension Other     EXAM: BP (!) 176/64 (BP Location: Right Arm)   Pulse (!) 53   Temp 98.8 F (37.1 C) (Oral)   Resp 18   SpO2 100%  CONSTITUTIONAL: Alert and oriented and responds appropriately to questions. Well-appearing; well-nourished, elderly, appears anxious  but not in distress HEAD: Normocephalic EYES: Conjunctivae clear, pupils appear equal, EOM appear intact ENT: normal nose; moist mucous membranes, patient has swelling of the tongue but no obvious swelling of the posterior oropharynx, no swelling of the lips; no trismus, no stridor, no drooling, normal phonation; no facial or neck swelling on exam NECK: Supple, normal ROM CARD: RRR; S1 and S2 appreciated; no murmurs, no clicks, no rubs, no gallops RESP: Normal chest excursion without splinting or tachypnea; breath sounds clear and equal bilaterally; no wheezes, no rhonchi, no rales, no hypoxia or respiratory distress, speaking full sentences ABD/GI: Normal bowel sounds; non-distended; soft, non-tender, no rebound, no guarding, no peritoneal signs, no hepatosplenomegaly BACK:  The back appears normal EXT: Normal ROM in all joints; no deformity noted, no edema; no cyanosis SKIN: Normal color for age and race; warm; no rash on exposed skin NEURO: Moves all extremities equally PSYCH: The patient's mood and manner are appropriate.   MEDICAL DECISION MAKING: Patient here with angioedema.  It appears her symptoms have improved with Benadryl and Solu-Medrol in the past.  Family reports allergy to famotidine.  At this time I do not feel she needs TXA, complement inhibitors, FFP or airway protection.  Will monitor closely.  Will keep on pulse oximeter.  IV Solu-Medrol and IV Benadryl have been ordered.  ED PROGRESS: Signed out to oncoming ED physician Dr. Billy Fischer to reassess patient.  I reviewed all nursing notes and pertinent previous records as available.  I have interpreted any EKGs, lab and urine results, imaging (as available).    CRITICAL CARE Performed by: Pryor Curia   Total critical care time: 40 minutes  Critical care time was exclusive of separately billable procedures and treating other patients.  Critical care was necessary to treat or prevent imminent or life-threatening  deterioration.  Critical care was time spent personally by me on the following activities: development of treatment plan with patient and/or surrogate as well as nursing, discussions with consultants, evaluation of patient's response to treatment, examination of patient, obtaining history from patient or surrogate, ordering and performing treatments and interventions, ordering and review of laboratory studies, ordering and review of radiographic studies, pulse oximetry and re-evaluation of patient's condition.   BENTLI LLORENTE was evaluated in Emergency Department on 05/13/2019 for the symptoms described in the history of present illness. She was evaluated in the context of the global COVID-19 pandemic, which necessitated consideration that the patient might be at risk for infection with the SARS-CoV-2 virus that causes COVID-19. Institutional protocols and algorithms that pertain to the evaluation of patients at risk for COVID-19 are in a state of rapid change based on information released by regulatory bodies including the CDC and federal and state organizations. These policies and algorithms were followed during the patient's care in the ED.  Patient was seen wearing N95, face shield, gloves.    Tarius Stangelo, Delice Bison, DO 05/13/19 [id number]    Stevi Hollinshead, Delice Bison, DO  05/13/19 1038

## 2019-05-13 NOTE — ED Provider Notes (Signed)
  Physical Exam  BP (!) 168/67   Pulse (!) 55   Temp 98.8 F (37.1 C) (Oral)   Resp 14   SpO2 99%   Physical Exam  ED Course/Procedures     Procedures  MDM  Received care of pt at 730AM from Dr. Leonides Schanz. Please see her note for prior care. Briefly, this is an 85yo female with history of angioedema.  Daughter now confirms she no longer takes lisinopril.  She has never required admission and has always improved with steroids/benadryl.    On initial exam does have mild posterior pharyngeal swelling, tongue swelling. No stridor, no drooling, no dysphonia.  Had received steroids/benadryl prior to assumption of care.  Observed for 5+hr in the ED with improvement in symptoms, no pharyngeal swelling noted, mild tongue swelling however improved. Pt requesting going home, understands risks and reasons to return. She has had several episodes of similar in the past. Given rx for prednisone, discussed importance of close glucose monitoring.       Gareth Morgan, MD 05/14/19 1321

## 2019-05-13 NOTE — Discharge Instructions (Addendum)
Please stop taking lisinopril.  Please follow-up closely with your primary care physician.

## 2019-05-25 ENCOUNTER — Other Ambulatory Visit: Payer: Self-pay | Admitting: Internal Medicine

## 2019-05-25 DIAGNOSIS — M47816 Spondylosis without myelopathy or radiculopathy, lumbar region: Secondary | ICD-10-CM

## 2019-06-03 ENCOUNTER — Ambulatory Visit
Admission: RE | Admit: 2019-06-03 | Discharge: 2019-06-03 | Disposition: A | Discharge status: Home / Self Care | Payer: Medicare Other | Source: Ambulatory Visit | Attending: Internal Medicine | Admitting: Internal Medicine

## 2019-06-03 ENCOUNTER — Other Ambulatory Visit: Payer: Self-pay

## 2019-06-03 DIAGNOSIS — M47816 Spondylosis without myelopathy or radiculopathy, lumbar region: Secondary | ICD-10-CM

## 2019-06-03 IMAGING — MR MR LUMBAR SPINE W/O CM
5 series · 42 of 48 positions shown · non-contrast
Comparison: Lumbar radiography [DATE]

CLINICAL DATA: Spondylosis of lumbar region without myelopathy or
radiculopathy

EXAM:
MRI LUMBAR SPINE WITHOUT CONTRAST
TECHNIQUE: Multiplanar, multisequence MR imaging of the lumbar spine was
performed. No intravenous contrast was administered.

[Series 3: tirm sag · sagittal · 4.0mm · 0.55mm/px · 6 of 13 slices shown]
[im 1/13]
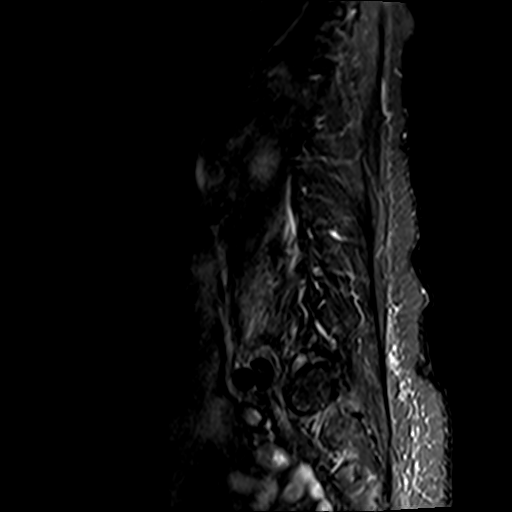
[im 3/13]
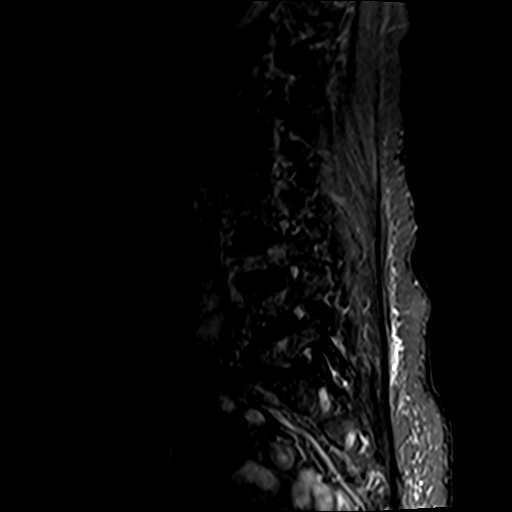
[im 5/13]
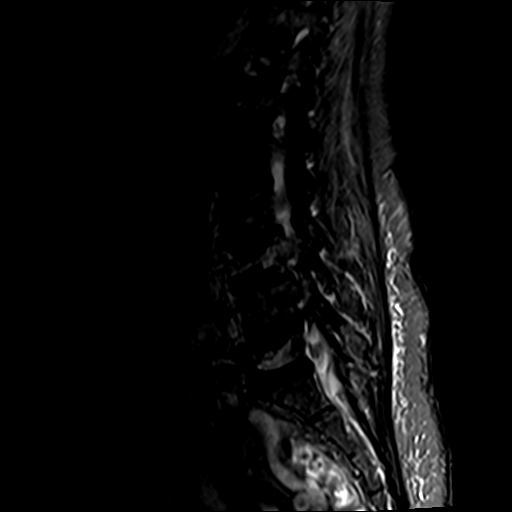
[im 8/13]
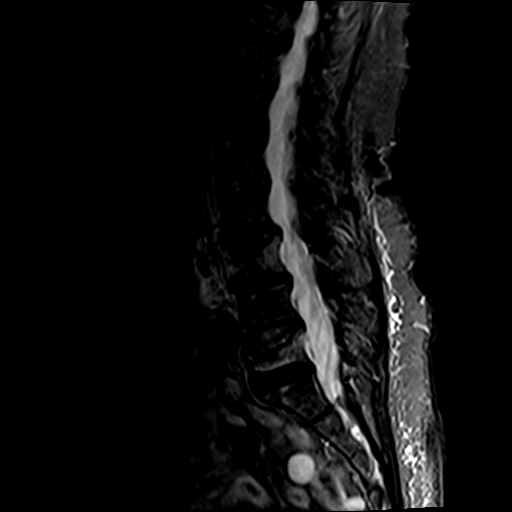
[im 10/13]
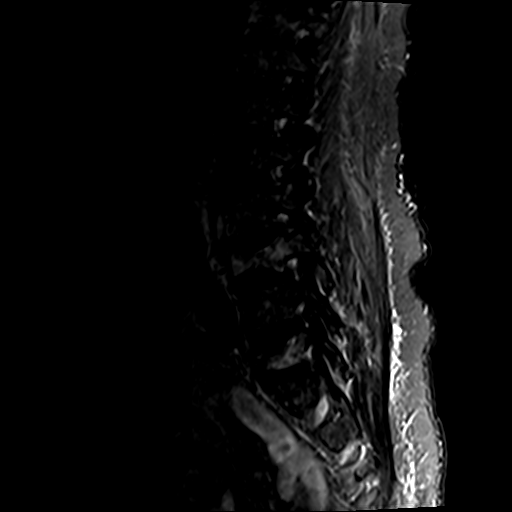
[im 13/13]
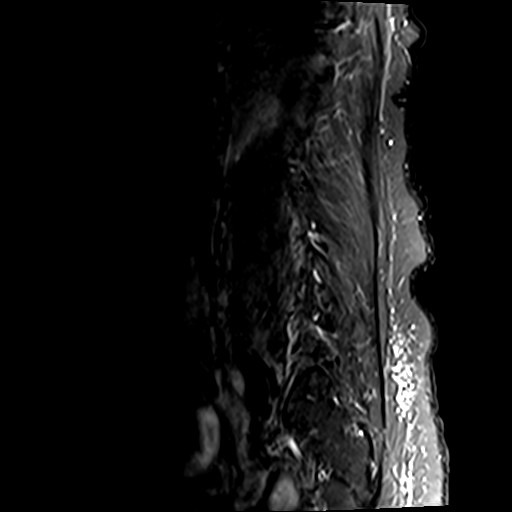

[Series 4: T2 · sagittal · 4.0mm · 0.88mm/px · 6 of 13 slices shown (1 of 2)]
[im 1/13]
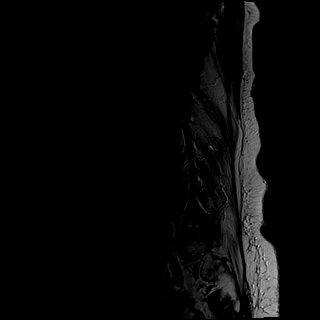
[im 3/13]
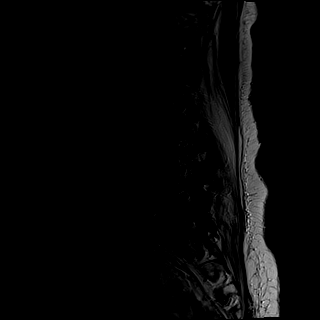
[im 5/13]
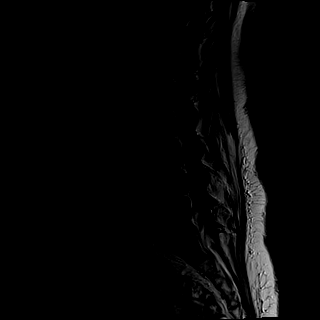
[im 8/13]
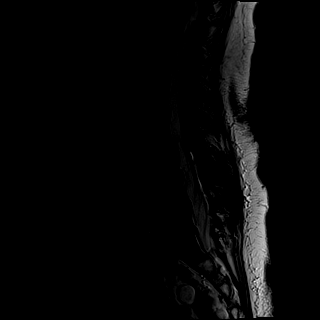
[im 10/13]
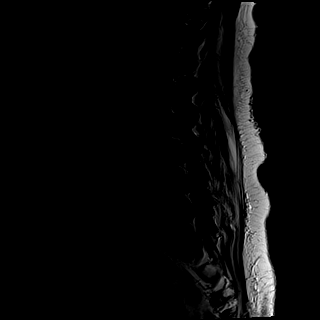
[im 13/13]
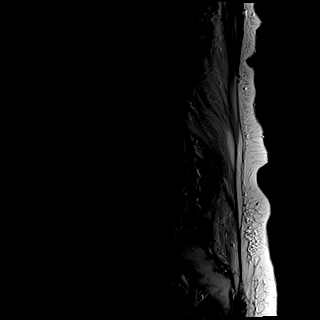

[Series 5: T1 · sagittal · 4.0mm · 0.88mm/px · 6 of 13 slices shown (1 of 2)]
[im 1/13]
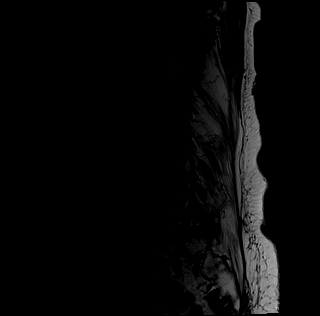
[im 3/13]
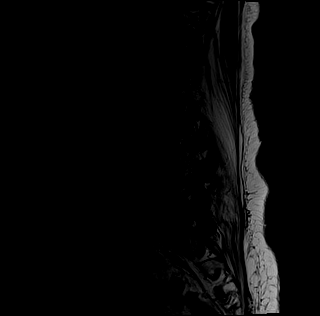
[im 5/13]
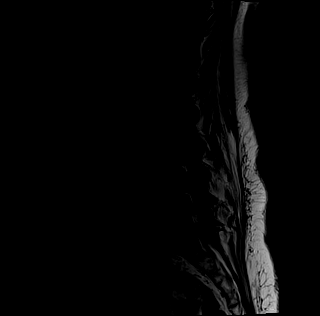
[im 8/13]
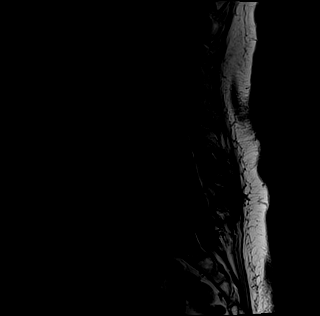
[im 10/13]
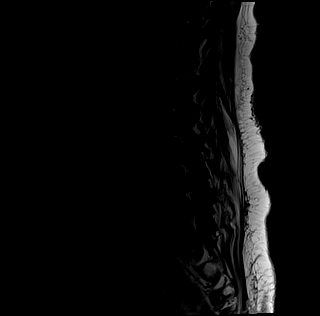
[im 13/13]
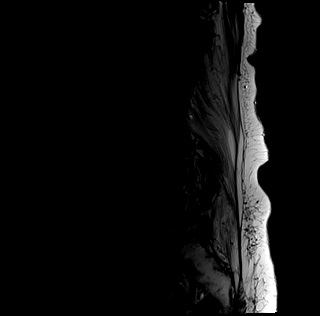

[Series 6: T1 · axial · 4.0mm · 0.78mm/px · z∈[-75,+95]mm · 9 of 33 slices shown (2 of 2)]
[im 1/33]
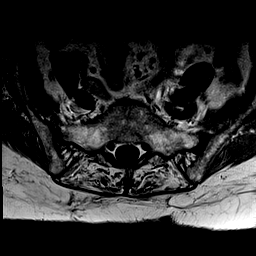
[im 5/33]
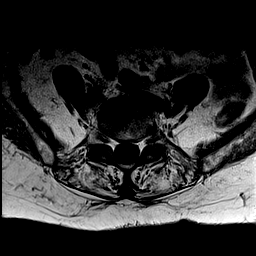
[im 10/33]
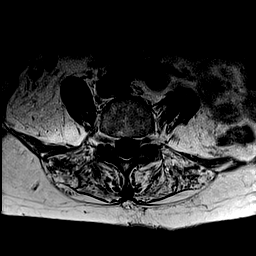
[im 14/33]
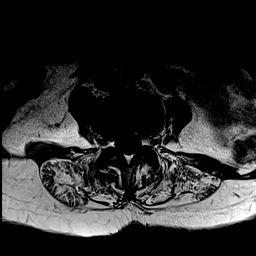
[im 17/33]
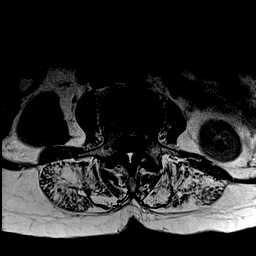
[im 19/33]
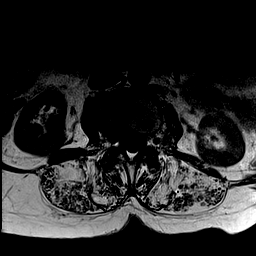
[im 23/33]
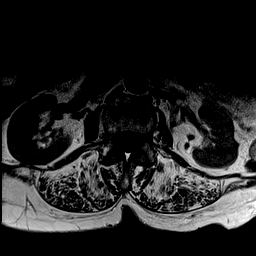
[im 28/33]
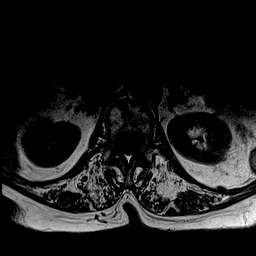
[im 33/33]
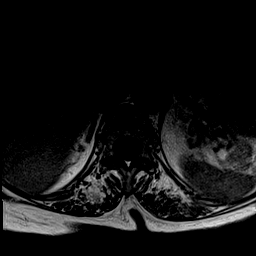

[Series 7: T2 · axial · 4.0mm · 0.78mm/px · z∈[-75,+95]mm · 15 of 33 slices shown (2 of 2)]
[im 1/33]
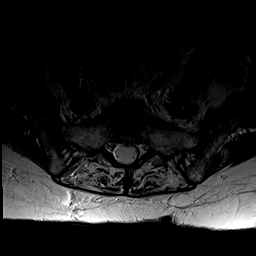
[im 3/33]
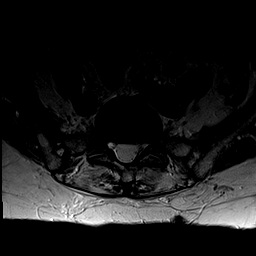
[im 5/33]
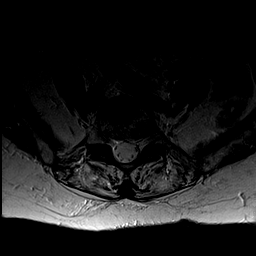
[im 7/33]
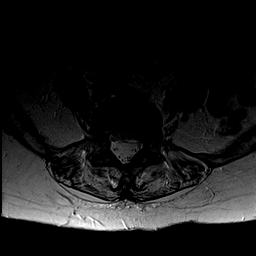
[im 10/33]
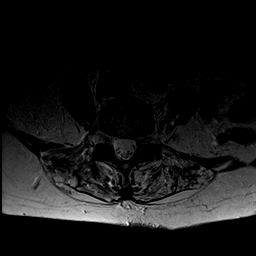
[im 12/33]
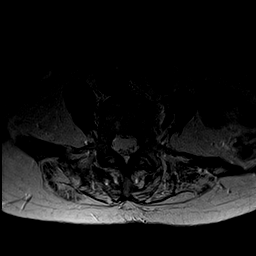
[im 14/33]
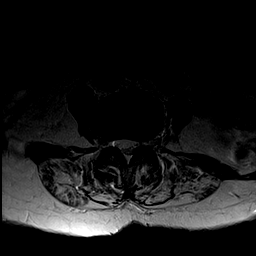
[im 17/33]
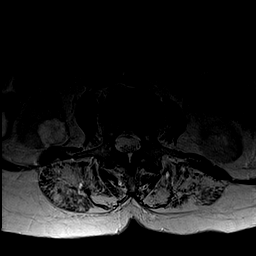
[im 19/33]
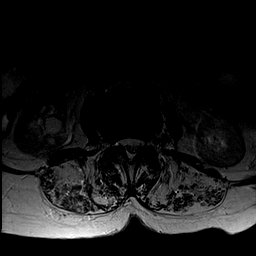
[im 21/33]
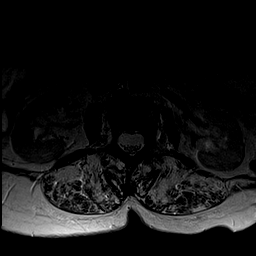
[im 23/33]
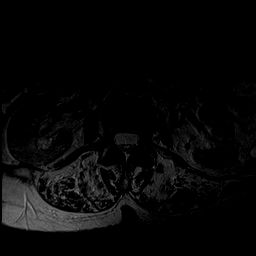
[im 26/33]
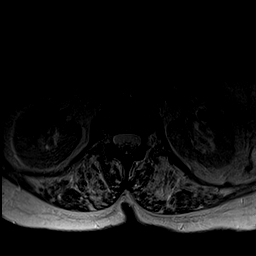
[im 28/33]
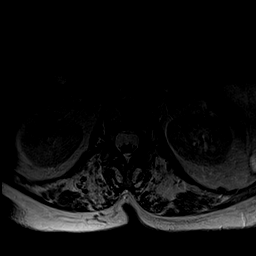
[im 30/33]
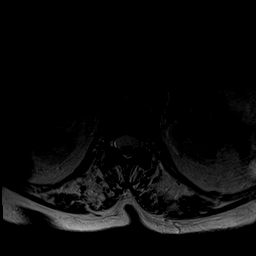
[im 33/33]
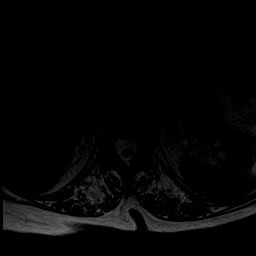

[42 of 48 positions shown; findings below may reference images not displayed]

FINDINGS: Segmentation:  5 lumbar type vertebrae

Alignment: Grade 1 degenerative anterolisthesis at L2-3 and to an
even lesser extent at L3-4

Vertebrae: Mild-to-moderate marrow edema within the compressed L3
and L5 vertebral bodies with horizontal fracture planes seen at both
levels. Height loss is stable at L5 and significantly progressed at
L3 when compared to radiography. The L3 height loss is central with
preserved anterior and posterior cortex. No retropulsion.

No evidence of bone lesion

Conus medullaris and cauda equina: Conus extends to the L1-2 level.
Conus and cauda equina appear normal.

Paraspinal and other soft tissues: 2.4 cm right lower pole renal
mass which has a T2 hyperintense appearance that is complicated by
internal hypointense septae day. This could be solid or complex
cystic. Prominent fatty atrophy of intrinsic back muscles.

Disc levels:

T12- L1: Unremarkable.

L1-L2: Mild facet spurring.  No herniation or impingement

L2-L3: Disc narrowing and bulging. Facet degeneration with
anterolisthesis and mild spurring. No neural impingement

L3-L4: Mild disc narrowing and bulging. Degenerative facet spurring.
No neural impingement

L4-L5: Mild disc bulging and facet spurring.  No neural impingement

L5-S1:Mild disc narrowing and bulging. Mild facet spurring. No
impingement

These results will be called to the ordering clinician or
representative by the Radiologist Assistant, and communication
documented in the PACS or zVision Dashboard.
IMPRESSION: 1. L3 acute or subacute compression fracture that has become
apparent since [DATE] radiograph. Central height loss is
advanced but anterior and posterior cortex is preserved and there is
no retropulsion.
2. L5 subacute compression fracture with stable anterior height loss
since comparison. Moderate residual marrow edema.
3. Degenerative disease with L2-3 anterolisthesis. No compressive
stenosis.
4. 2.5 cm complex right renal lesion which could be solid or complex
cystic. If a treatment candidate recommend renal MRI with contrast.

## 2020-02-20 ENCOUNTER — Other Ambulatory Visit: Payer: Self-pay | Admitting: Urology

## 2020-02-20 DIAGNOSIS — D49511 Neoplasm of unspecified behavior of right kidney: Secondary | ICD-10-CM

## 2020-02-26 ENCOUNTER — Ambulatory Visit (HOSPITAL_COMMUNITY)
Admission: RE | Admit: 2020-02-26 | Discharge: 2020-02-26 | Disposition: A | Discharge status: Home / Self Care | Payer: Medicare Other | Source: Ambulatory Visit | Attending: Urology | Admitting: Urology

## 2020-02-26 ENCOUNTER — Other Ambulatory Visit: Payer: Self-pay | Admitting: Urology

## 2020-02-26 ENCOUNTER — Other Ambulatory Visit: Payer: Self-pay

## 2020-02-26 DIAGNOSIS — D49511 Neoplasm of unspecified behavior of right kidney: Secondary | ICD-10-CM | POA: Insufficient documentation

## 2020-02-26 IMAGING — MR MR ABDOMEN WO/W CM
18 series · 48 of 48 positions shown · IV contrast (gadavist)
Comparison: CT abdomen/pelvis dated [DATE]

CLINICAL DATA: Right renal neoplasm

EXAM:
MRI ABDOMEN WITHOUT AND WITH CONTRAST
TECHNIQUE: Multiplanar multisequence MR imaging of the abdomen was performed
both before and after the administration of intravenous contrast.
CONTRAST:  7mL GADAVIST GADOBUTROL 1 MMOL/ML IV SOLN

[Series 3: T2 · coronal · 6.0mm · 1.41mm/px · 2 of 26 slices shown (1 of 2)]
[im 1/26]
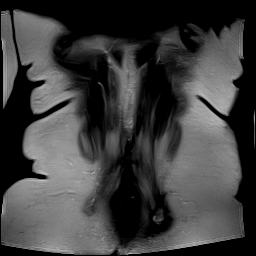
[im 26/26]
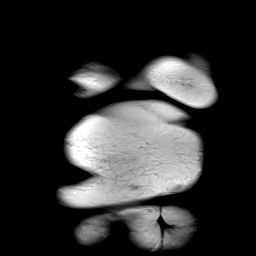

[Series 4: T2 fat-sat · axial · 6.0mm · 1.12mm/px · z∈[-19,+211]mm · 2 of 33 slices shown]
[im 1/33]
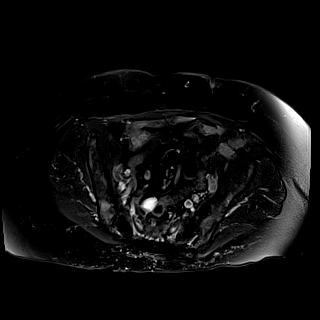
[im 33/33]
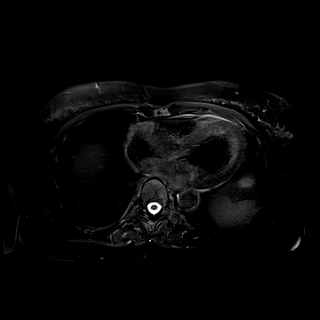

[Series 6: T1 · axial · 3.0mm · 1.12mm/px · z∈[-8,+205]mm · 4 of 72 slices shown (1 of 2)]
[im 1/72]
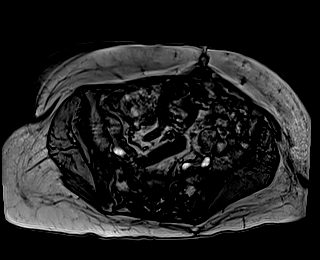
[im 24/72]
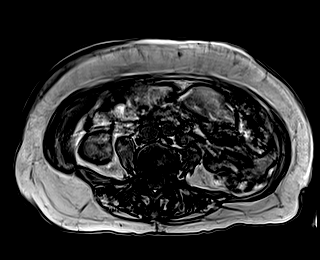
[im 48/72]
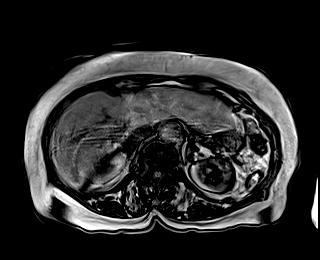
[im 72/72]
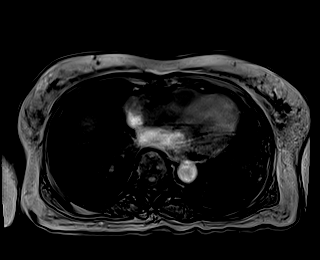

[Series 7: T1 · axial · 3.0mm · 1.12mm/px · z∈[-8,+205]mm · 4 of 72 slices shown (2 of 2)]
[im 1/72]
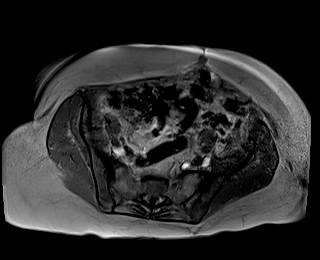
[im 24/72]
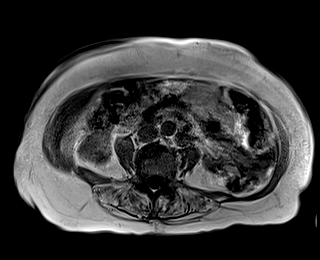
[im 48/72]
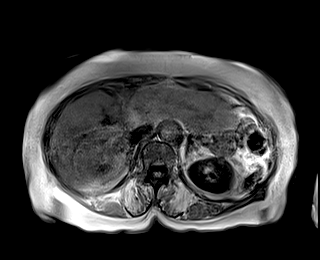
[im 72/72]
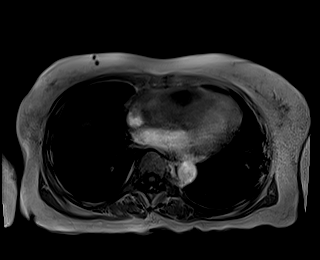

[Series 8: DWI · axial · 6.0mm · 1.36mm/px · z∈[-2,+206]mm · 3 of 60 slices shown (1 of 2)]
[im 1/60]
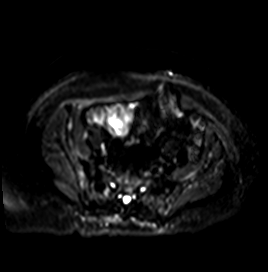
[im 30/60]
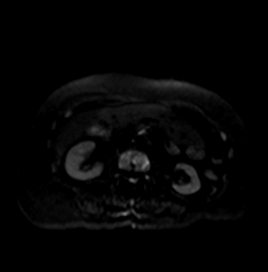
[im 60/60]
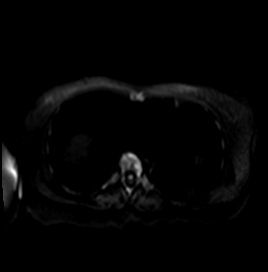

[Series 9: DWI · axial · 6.0mm · 1.36mm/px · 1 of 30 slices shown (2 of 2)]
[im 1/30]
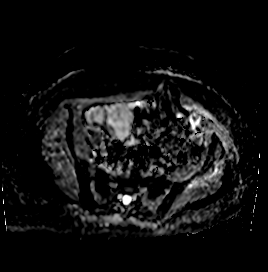

[Series 10: bSSFP · axial · 5.0mm · 0.70mm/px · z∈[+3,+217]mm · 2 of 40 slices shown]
[im 1/40]
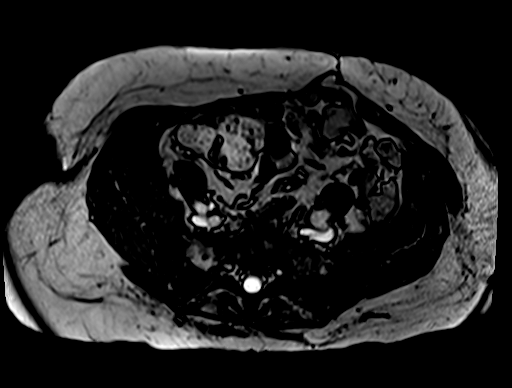
[im 40/40]
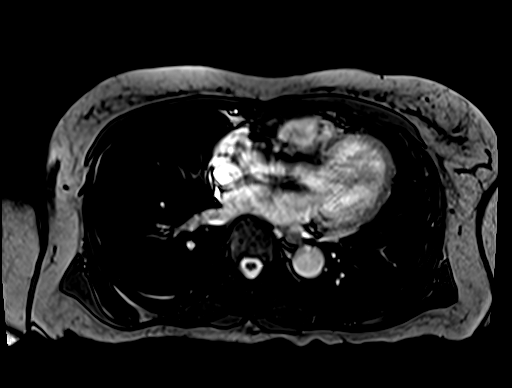

[Series 12: T1 dynamic · axial · 3.0mm · 1.12mm/px · z∈[-27,+210]mm · 3 of 80 slices shown (1 of 6)]
[im 1/80]
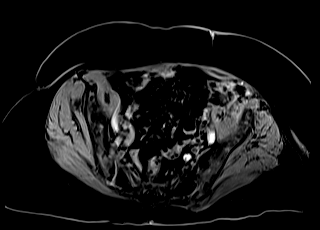
[im 40/80]
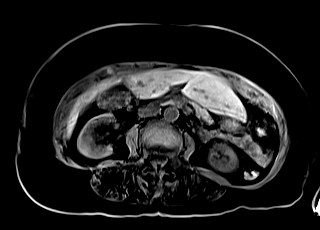
[im 80/80]
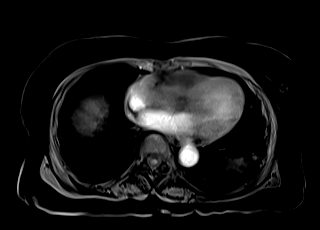

[Series 15: T1 dynamic · axial · 3.0mm · 1.12mm/px · z∈[-27,+210]mm · 3 of 80 slices shown (2 of 6)]
[im 1/80]
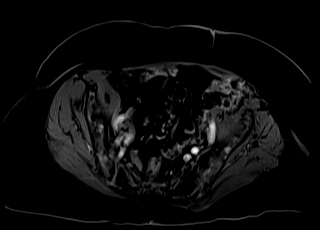
[im 40/80]
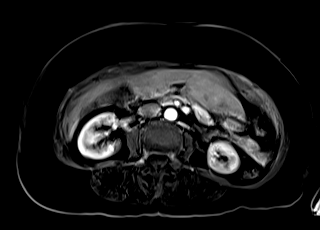
[im 80/80]
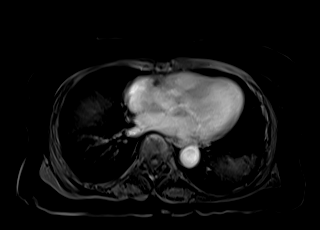

[Series 17: T1 dynamic · axial · 3.0mm · 1.12mm/px · z∈[-27,+210]mm · 3 of 80 slices shown (3 of 6)]
[im 1/80]
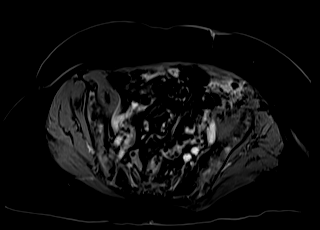
[im 40/80]
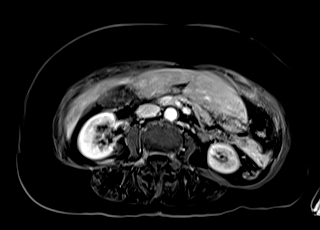
[im 80/80]
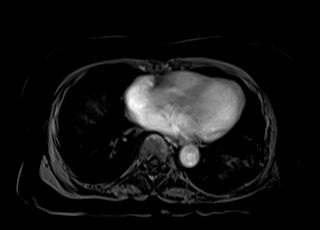

[Series 19: T1 dynamic · axial · 3.0mm · 1.12mm/px · z∈[-27,+210]mm · 3 of 80 slices shown (4 of 6)]
[im 1/80]
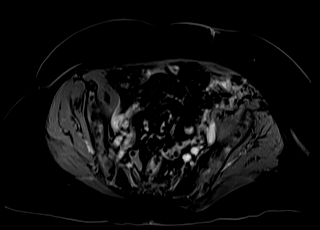
[im 40/80]
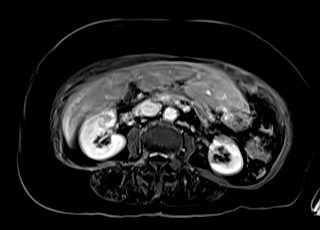
[im 80/80]
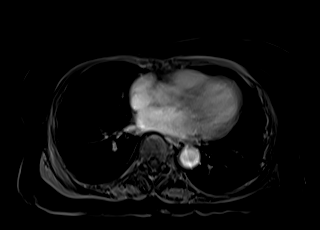

[Series 21: T1 dynamic · coronal · 4.0mm · 1.31mm/px · 2 of 56 slices shown (5 of 6)]
[im 1/56]
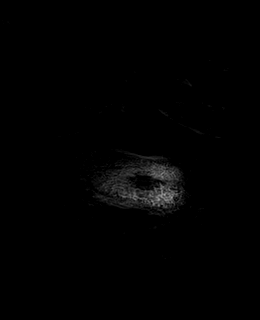
[im 56/56]
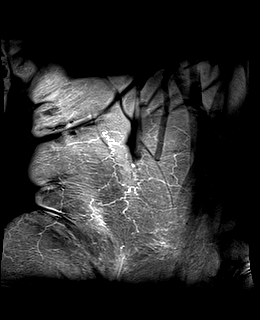

[Series 22: T2 · axial · 6.0mm · 1.41mm/px · 1 of 30 slices shown (2 of 2)]
[im 1/30]
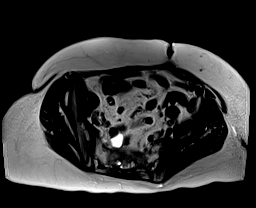

[Series 24: T1 dynamic · axial · 3.0mm · 1.12mm/px · z∈[-27,+210]mm · 3 of 80 slices shown (6 of 6)]
[im 1/80]
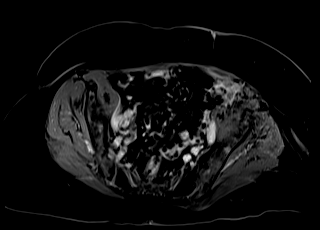
[im 40/80]
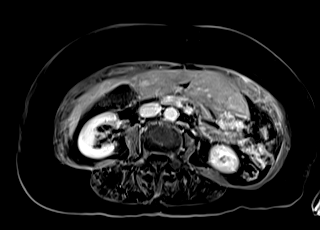
[im 80/80]
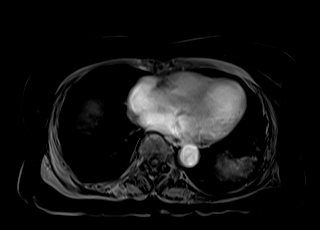

[Series 100: sub 20 sec · axial · 3.0mm · 1.12mm/px · z∈[-27,+210]mm · 3 of 80 slices shown]
[im 1/80]
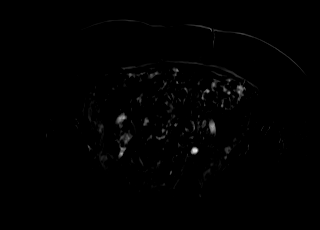
[im 40/80]
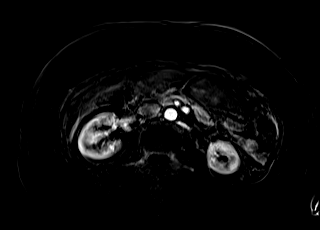
[im 80/80]
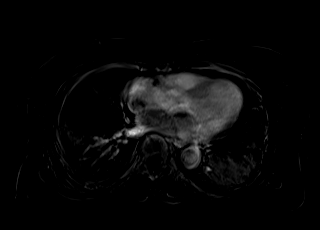

[Series 101: sub 45 sec · axial · 3.0mm · 1.12mm/px · z∈[-27,+210]mm · 3 of 80 slices shown]
[im 1/80]
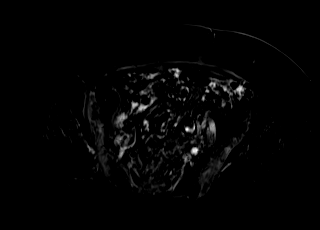
[im 40/80]
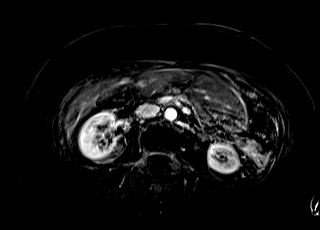
[im 80/80]
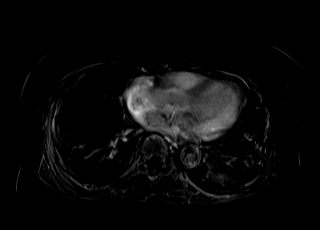

[Series 102: sub 90 sec · axial · 3.0mm · 1.12mm/px · z∈[-27,+210]mm · 3 of 80 slices shown]
[im 1/80]
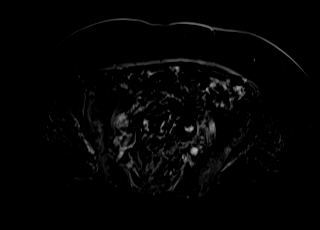
[im 40/80]
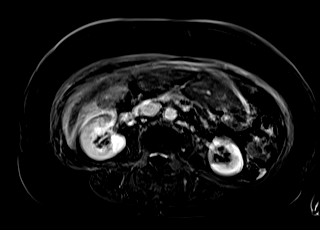
[im 80/80]
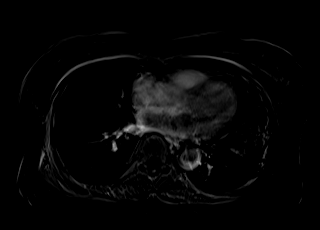

[Series 103: sub 3 min · axial · 3.0mm · 1.12mm/px · z∈[-27,+210]mm · 3 of 80 slices shown]
[im 1/80]
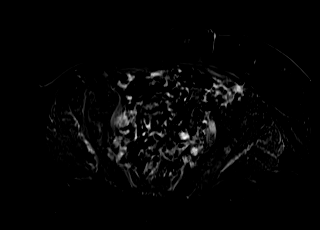
[im 40/80]
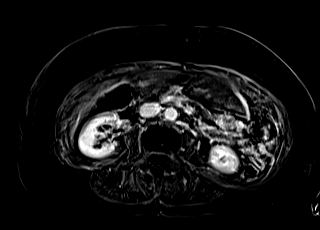
[im 80/80]
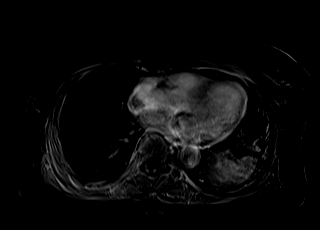

[48 of 48 positions shown; findings below may reference images not displayed]

FINDINGS: Motion degraded images.

Lower chest: Lung bases are clear.

Hepatobiliary: Liver is within normal limits. No
suspicious/enhancing hepatic lesions. No hepatic steatosis.

Gallbladder is unremarkable. No intrahepatic or extrahepatic ductal
dilatation.

Pancreas:  Grossly unremarkable.

Spleen:  Grossly unremarkable.

Adrenals/Urinary Tract:  Adrenal glands are within normal limits.

Left kidney is notable for a 6 mm simple cyst in the upper pole
(series 4/image 9), benign (Bosniak I).

9 mm right lower pole renal sinus cyst (series 4/image 19), benign
(Bosniak I). 2.0 x 2.0 x 2.2 cm complex cystic lesion with multiple
septations, demonstrating measurable enhancement (series 4/image
49). The medial enhancing mural nodule on CT is less conspicuous on
MR but may be present (series 201/image 37), considered equivocal.
If the mural nodule is a true finding, this would be Bosniak IV,
otherwise, this is Bosniak III. Regardless, this is considered
suspicious for cystic renal neoplasm.

No hydronephrosis.

Stomach/Bowel: Stomach is within normal limits.

Visualized bowel is grossly unremarkable, noting colonic
diverticulosis.

Vascular/Lymphatic:  No evidence abdominal aortic aneurysm.

No suspicious abdominal lymphadenopathy.

Other:  No abdominal ascites.

Musculoskeletal: No focal osseous lesions.
IMPRESSION: Motion degraded images.

2.2 cm complex cystic lesion in the right lower kidney, suspicious
for cystic renal neoplasm (Bosniak III-IV).

No evidence of metastatic disease.

## 2020-02-26 MED ORDER — GADOBUTROL 1 MMOL/ML IV SOLN
7.0000 mL | Freq: Once | INTRAVENOUS | Status: AC | PRN
  Administered 2020-02-26: 7 mL via INTRAVENOUS

## 2020-06-18 ENCOUNTER — Other Ambulatory Visit: Payer: Self-pay | Admitting: Physician Assistant

## 2020-06-18 DIAGNOSIS — R1013 Epigastric pain: Secondary | ICD-10-CM

## 2020-06-20 ENCOUNTER — Emergency Department (HOSPITAL_COMMUNITY)
Admission: EM | Admit: 2020-06-20 | Discharge: 2020-06-21 | Disposition: A | Discharge status: Home / Self Care | Payer: Medicare Other | Attending: Emergency Medicine | Admitting: Emergency Medicine

## 2020-06-20 ENCOUNTER — Encounter (HOSPITAL_COMMUNITY): Payer: Self-pay | Admitting: Emergency Medicine

## 2020-06-20 ENCOUNTER — Other Ambulatory Visit: Payer: Self-pay

## 2020-06-20 ENCOUNTER — Emergency Department (HOSPITAL_COMMUNITY): Payer: Medicare Other

## 2020-06-20 DIAGNOSIS — Z043 Encounter for examination and observation following other accident: Secondary | ICD-10-CM | POA: Insufficient documentation

## 2020-06-20 DIAGNOSIS — Y92002 Bathroom of unspecified non-institutional (private) residence single-family (private) house as the place of occurrence of the external cause: Secondary | ICD-10-CM | POA: Insufficient documentation

## 2020-06-20 DIAGNOSIS — R638 Other symptoms and signs concerning food and fluid intake: Secondary | ICD-10-CM | POA: Diagnosis not present

## 2020-06-20 DIAGNOSIS — Z79899 Other long term (current) drug therapy: Secondary | ICD-10-CM | POA: Insufficient documentation

## 2020-06-20 DIAGNOSIS — I10 Essential (primary) hypertension: Secondary | ICD-10-CM | POA: Insufficient documentation

## 2020-06-20 DIAGNOSIS — R5383 Other fatigue: Secondary | ICD-10-CM | POA: Diagnosis not present

## 2020-06-20 DIAGNOSIS — W19XXXA Unspecified fall, initial encounter: Secondary | ICD-10-CM | POA: Diagnosis not present

## 2020-06-20 DIAGNOSIS — E119 Type 2 diabetes mellitus without complications: Secondary | ICD-10-CM | POA: Insufficient documentation

## 2020-06-20 DIAGNOSIS — Z7984 Long term (current) use of oral hypoglycemic drugs: Secondary | ICD-10-CM | POA: Diagnosis not present

## 2020-06-20 DIAGNOSIS — S0990XA Unspecified injury of head, initial encounter: Secondary | ICD-10-CM | POA: Diagnosis not present

## 2020-06-20 LAB — CBC WITH DIFFERENTIAL/PLATELET
Abs Immature Granulocytes: 0.01 10*3/uL (ref 0.00–0.07)
Basophils Absolute: 0 10*3/uL (ref 0.0–0.1)
Basophils Relative: 1 %
Eosinophils Absolute: 0 10*3/uL (ref 0.0–0.5)
Eosinophils Relative: 0 %
HCT: 34.4 % — ABNORMAL LOW (ref 36.0–46.0)
Hemoglobin: 11.2 g/dL — ABNORMAL LOW (ref 12.0–15.0)
Immature Granulocytes: 0 %
Lymphocytes Relative: 32 %
Lymphs Abs: 1.5 10*3/uL (ref 0.7–4.0)
MCH: 28.4 pg (ref 26.0–34.0)
MCHC: 32.6 g/dL (ref 30.0–36.0)
MCV: 87.1 fL (ref 80.0–100.0)
Monocytes Absolute: 0.3 10*3/uL (ref 0.1–1.0)
Monocytes Relative: 6 %
Neutro Abs: 2.9 10*3/uL (ref 1.7–7.7)
Neutrophils Relative %: 61 %
Platelets: 217 10*3/uL (ref 150–400)
RBC: 3.95 MIL/uL (ref 3.87–5.11)
RDW: 17 % — ABNORMAL HIGH (ref 11.5–15.5)
WBC: 4.8 10*3/uL (ref 4.0–10.5)
nRBC: 0 % (ref 0.0–0.2)

## 2020-06-20 LAB — COMPREHENSIVE METABOLIC PANEL
ALT: 23 U/L (ref 0–44)
AST: 45 U/L — ABNORMAL HIGH (ref 15–41)
Albumin: 4.5 g/dL (ref 3.5–5.0)
Alkaline Phosphatase: 66 U/L (ref 38–126)
Anion gap: 14 (ref 5–15)
BUN: 12 mg/dL (ref 8–23)
CO2: 22 mmol/L (ref 22–32)
Calcium: 9.8 mg/dL (ref 8.9–10.3)
Chloride: 105 mmol/L (ref 98–111)
Creatinine, Ser: 0.92 mg/dL (ref 0.44–1.00)
GFR, Estimated: >60 mL/min (ref >60)
Glucose, Bld: 100 mg/dL — ABNORMAL HIGH (ref 70–99)
Potassium: 3.8 mmol/L (ref 3.5–5.1)
Sodium: 141 mmol/L (ref 135–145)
Total Bilirubin: 0.8 mg/dL (ref 0.3–1.2)
Total Protein: 7.2 g/dL (ref 6.5–8.1)

## 2020-06-20 LAB — CK: Total CK: 876 U/L — ABNORMAL HIGH (ref 38–234)

## 2020-06-20 IMAGING — CT CT CERVICAL SPINE W/O CM
3 of 5 series · 11 of 33 positions shown, 13 images · non-contrast
Comparison: None.

CLINICAL DATA: Fall

EXAM:
CT HEAD WITHOUT CONTRAST
TECHNIQUE: Contiguous axial images were obtained from the base of the skull
through the vertex without intravenous contrast.

[Series 7: orthogonal bone · axial · 0.23mm/px · z∈[-244,-157]mm · 3 of 110 slices shown, 4 images]
[im 28/110  soft-tissue]
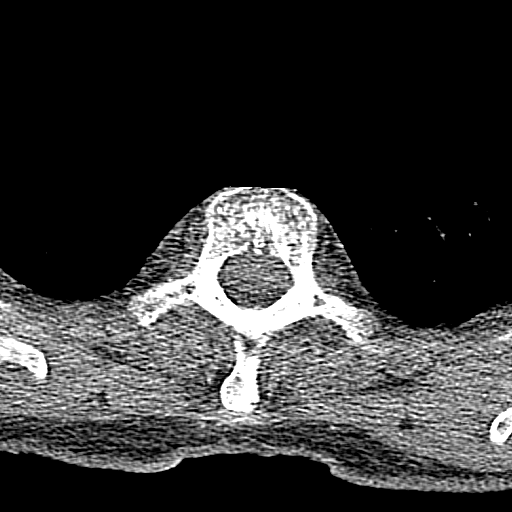
[im 28/110  bone]
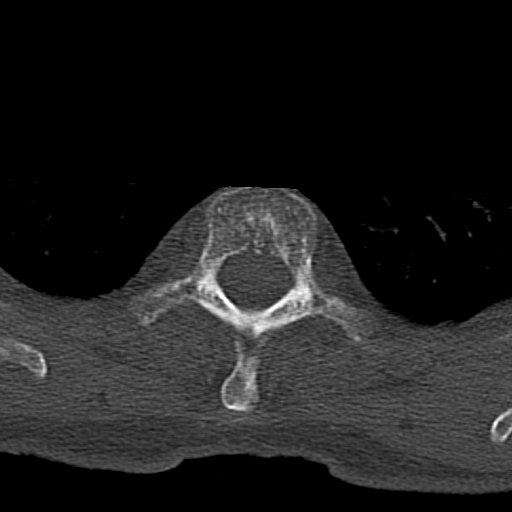
[im 55/110  bone]
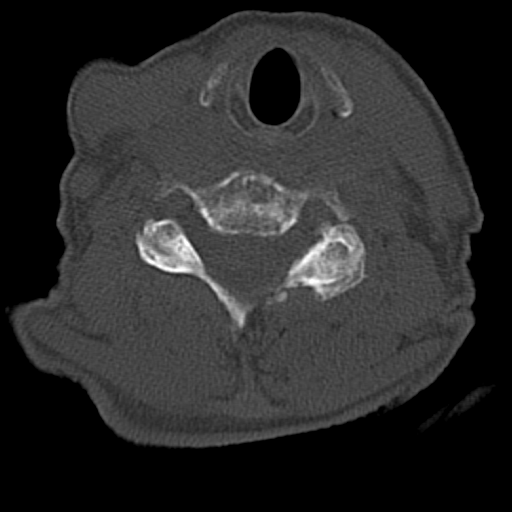
[im 82/110  bone]
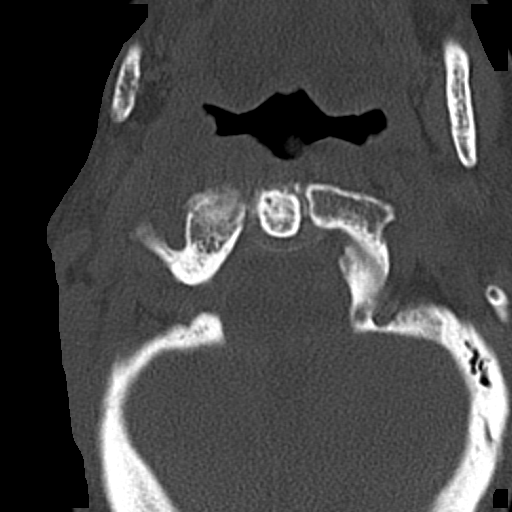

[Series 8: coronal bone · coronal · 0.23mm/px · 3 of 61 slices shown]
[im 18/61  bone]
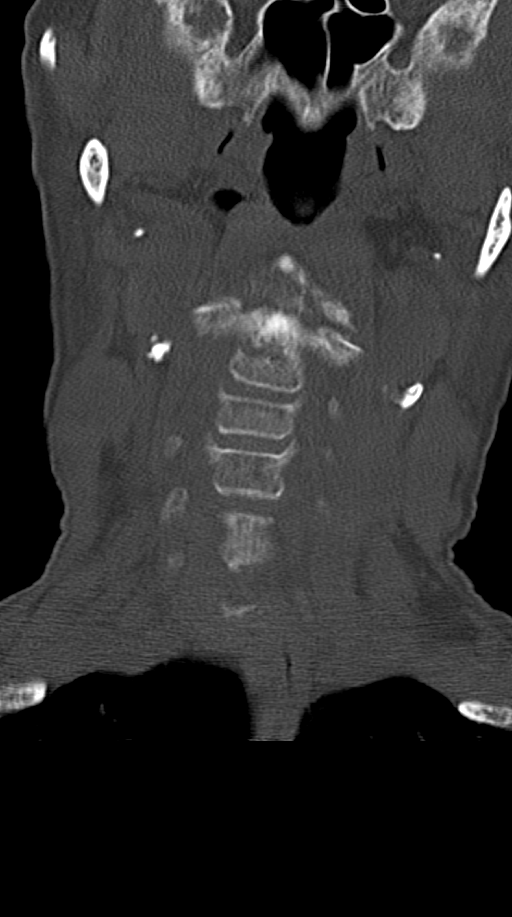
[im 26/61  bone]
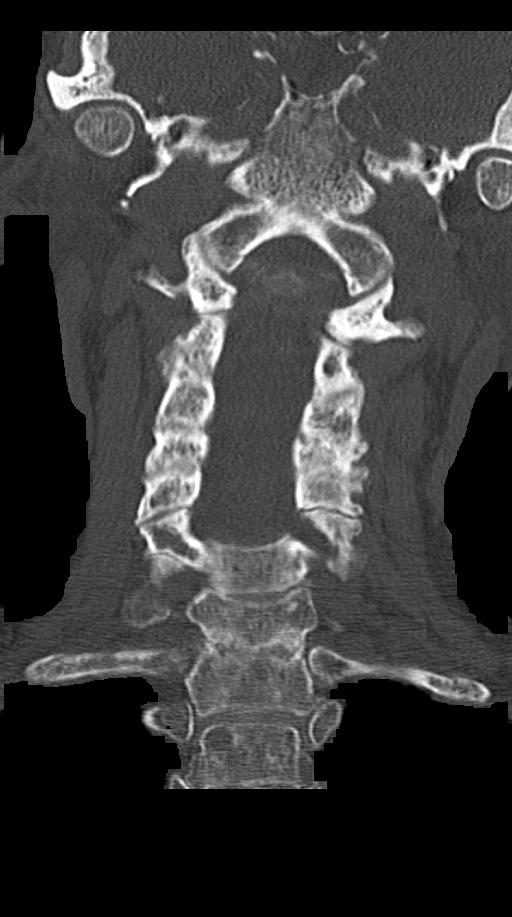
[im 35/61  bone]
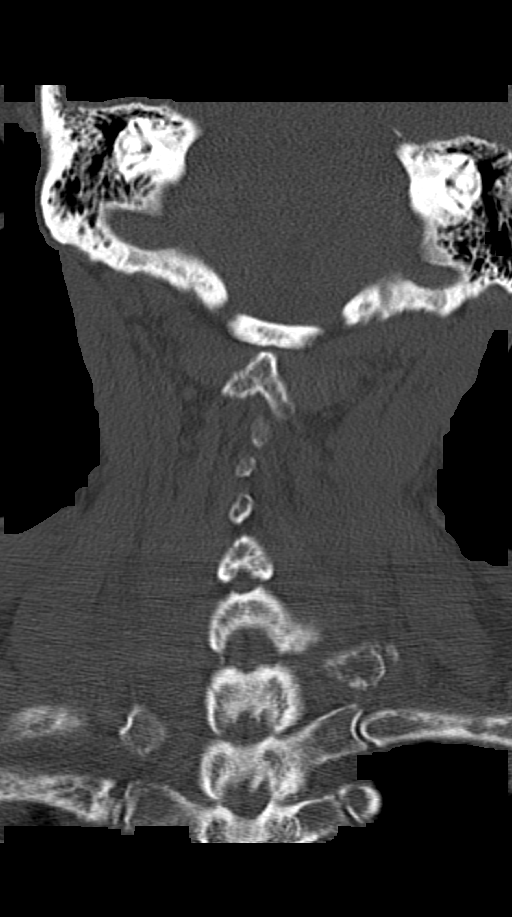

[Series 9: sagittal bone · sagittal · 0.23mm/px · 5 of 61 slices shown, 6 images]
[im 21/61  bone]
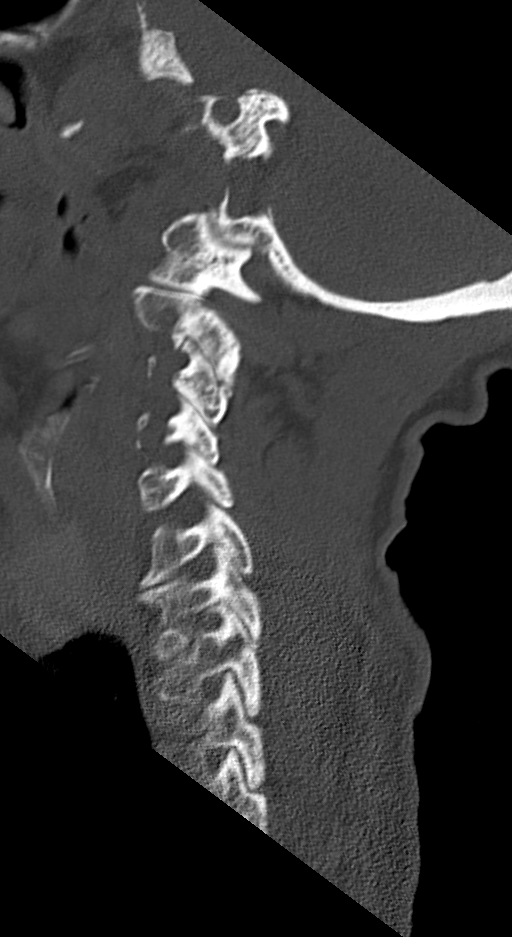
[im 26/61  bone]
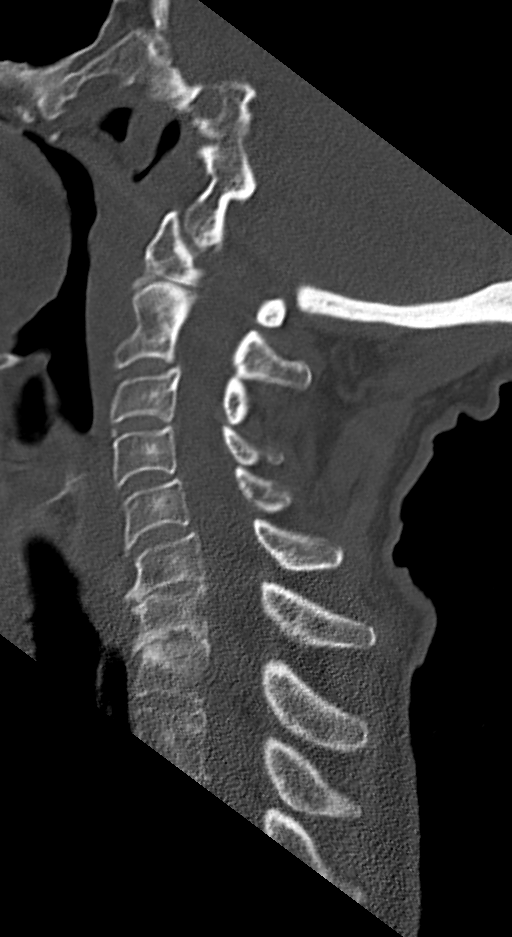
[im 31/61  soft-tissue]
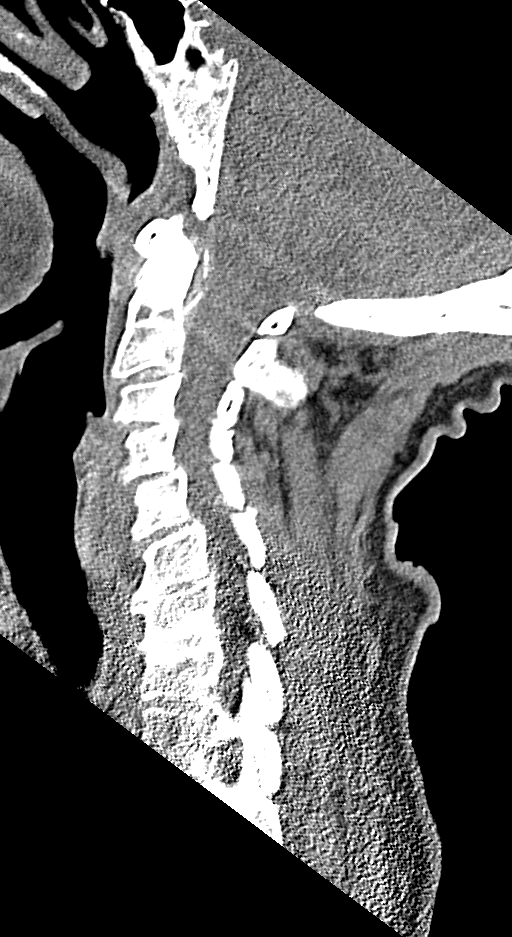
[im 31/61  bone]
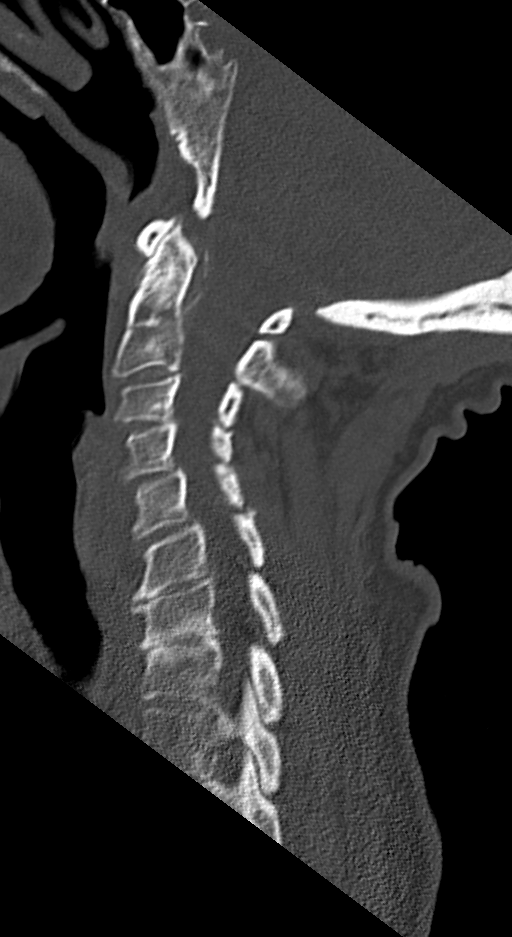
[im 36/61  bone]
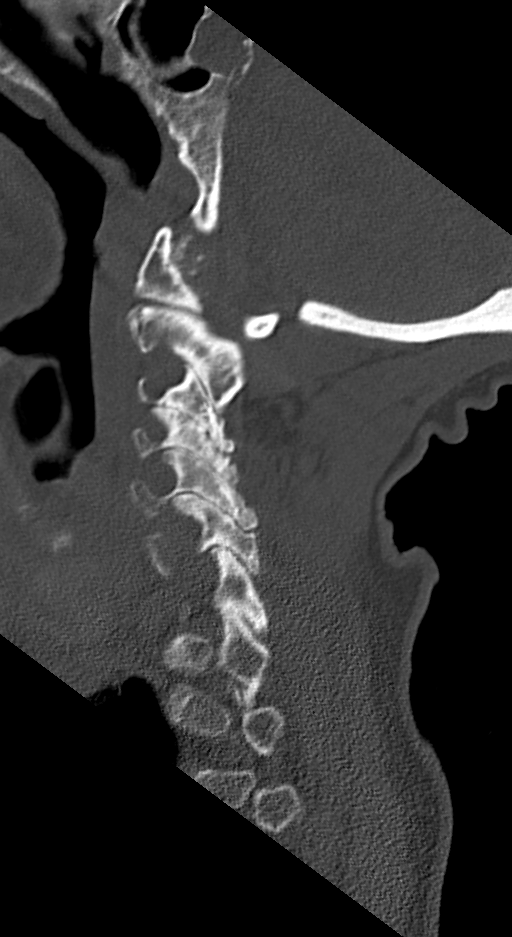
[im 41/61  bone]
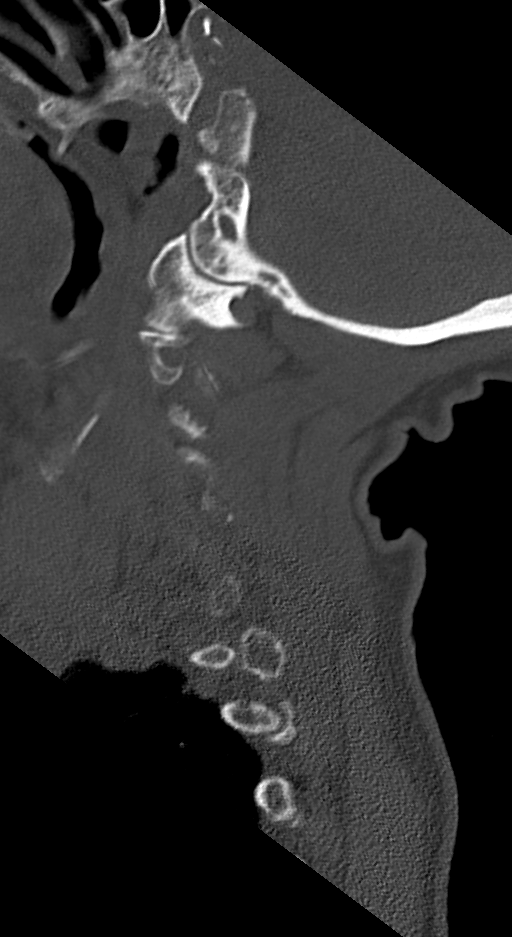

[11 of 33 positions shown; findings below may reference images not displayed]

FINDINGS: Brain: No evidence of acute territorial infarction, hemorrhage,
hydrocephalus,extra-axial collection or mass lesion/mass effect.
There is dilatation the ventricles and sulci consistent with
age-related atrophy. Low-attenuation changes in the deep white
matter consistent with small vessel ischemia.

Vascular: No hyperdense vessel or unexpected calcification.

Skull: The skull is intact. No fracture or focal lesion identified.

Sinuses/Orbits: The visualized paranasal sinuses and mastoid air
cells are clear. The orbits and globes intact.

Other: None

Cervical spine:

Multilevel cervical spine spondylosis is seen most notable at C6-C7
with moderate neural foraminal narrowing.
IMPRESSION: No acute intracranial abnormality.

Findings consistent with age related atrophy and chronic small
vessel ischemia

No acute fracture or malalignment of the spine.

## 2020-06-20 IMAGING — CT CT HEAD W/O CM
3 of 5 series · 14 of 47 positions shown, 16 images · non-contrast
Comparison: None.

CLINICAL DATA: Fall

EXAM:
CT HEAD WITHOUT CONTRAST
TECHNIQUE: Contiguous axial images were obtained from the base of the skull
through the vertex without intravenous contrast.

[Series 6: coronal soft tissue · coronal · 0.33mm/px · 3 of 73 slices shown]
[im 25/73  brain]
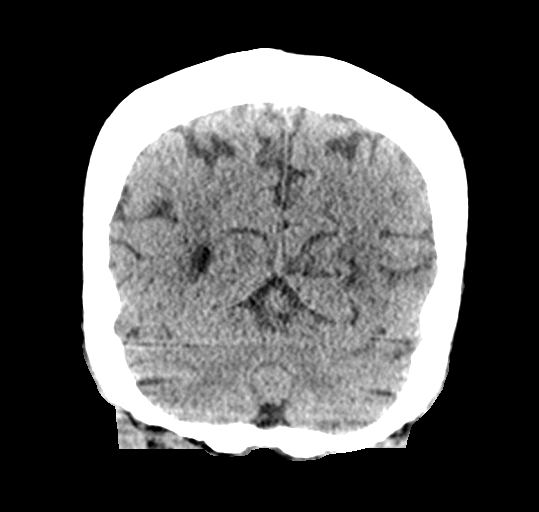
[im 33/73  brain]
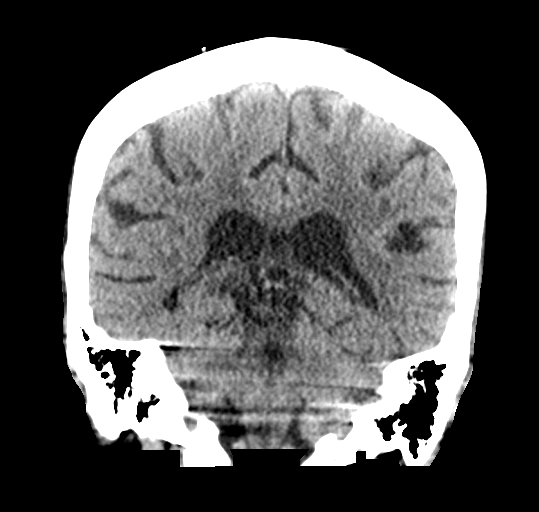
[im 41/73  brain]
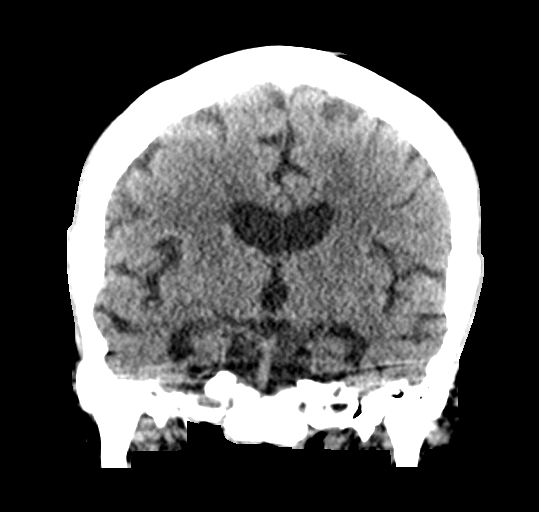

[Series 7: sagittal soft tissue · sagittal · 0.36mm/px · 3 of 54 slices shown]
[im 18/54  brain]
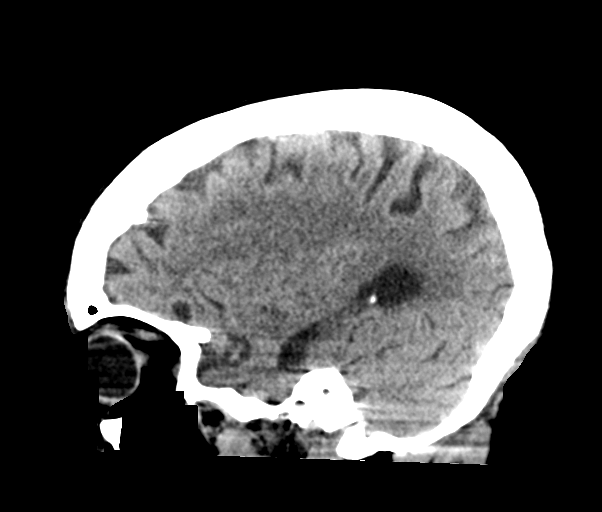
[im 27/54  brain]
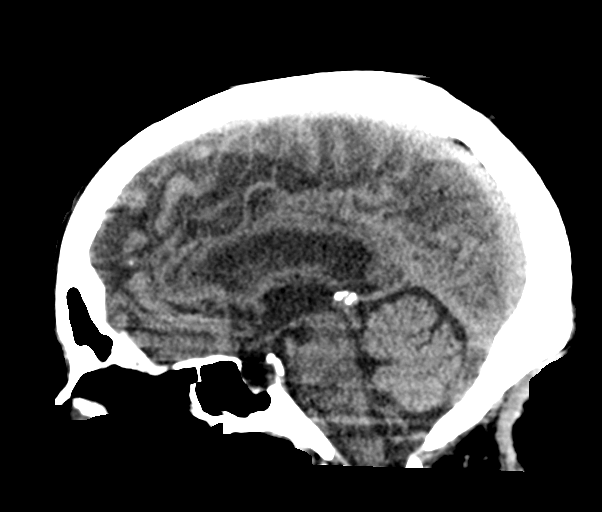
[im 36/54  brain]
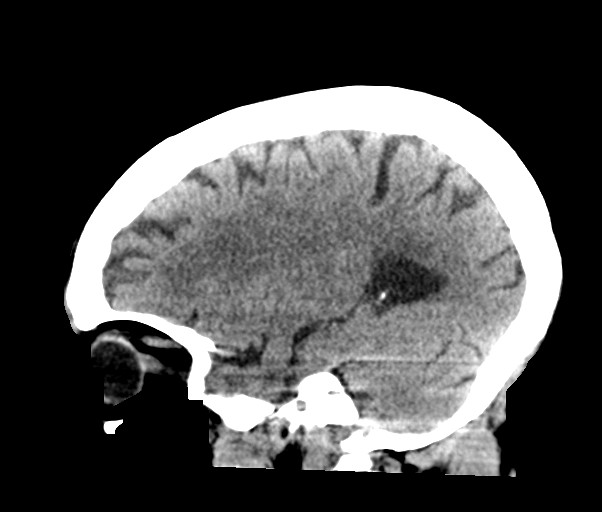

[Series 8: true axial · axial · 0.31mm/px · z∈[-84,+49]mm · 8 of 57 slices shown, 10 images]
[im 6/57  brain]
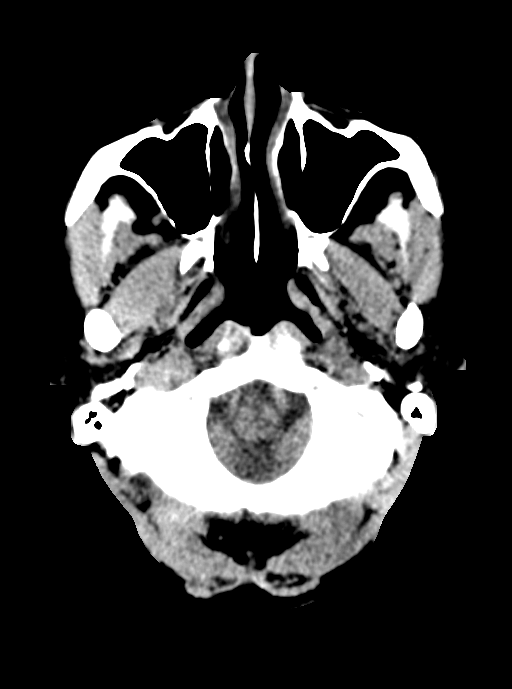
[im 6/57  bone]
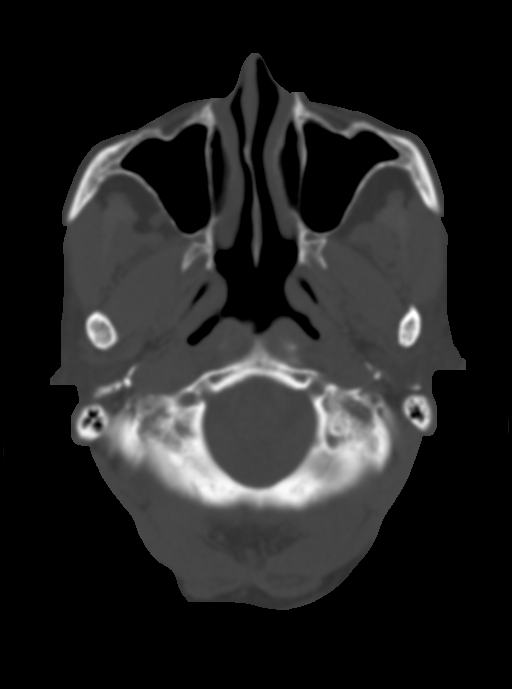
[im 11/57  brain]
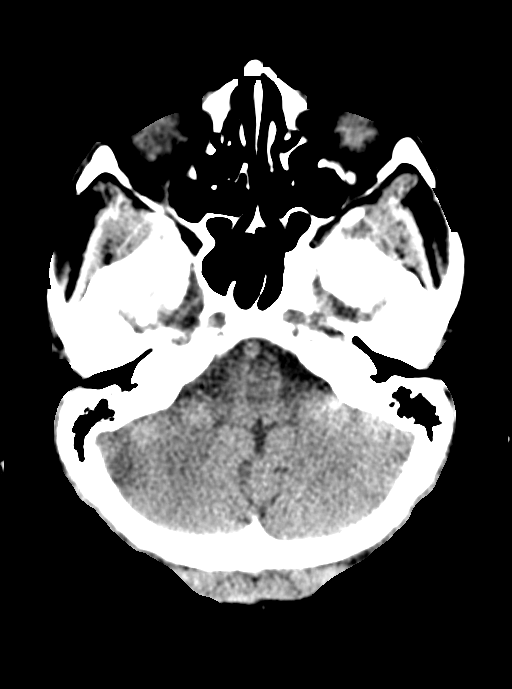
[im 21/57  brain]
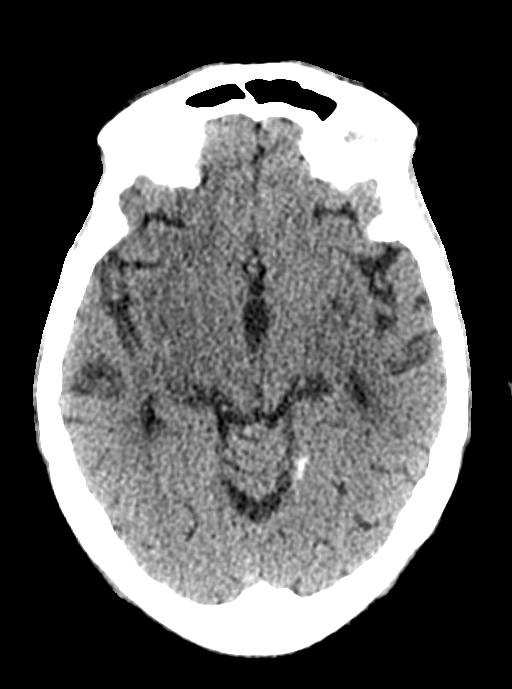
[im 26/57  brain]
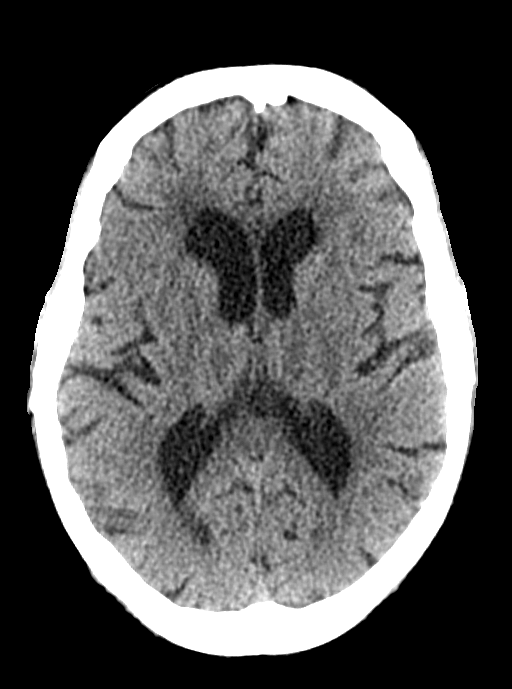
[im 31/57  brain]
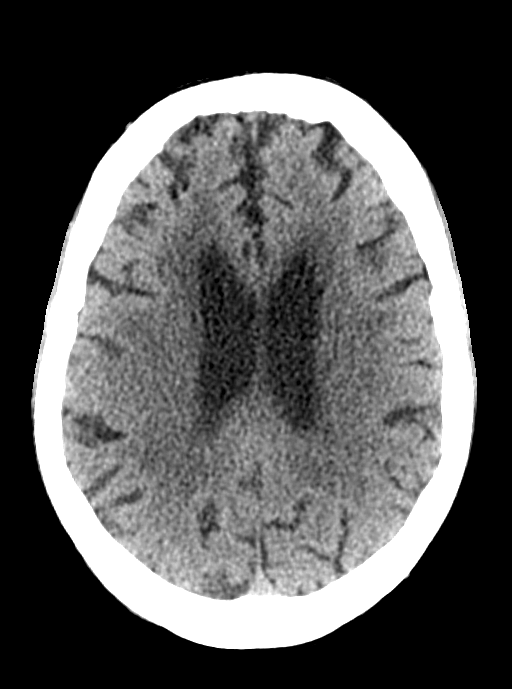
[im 31/57  bone]
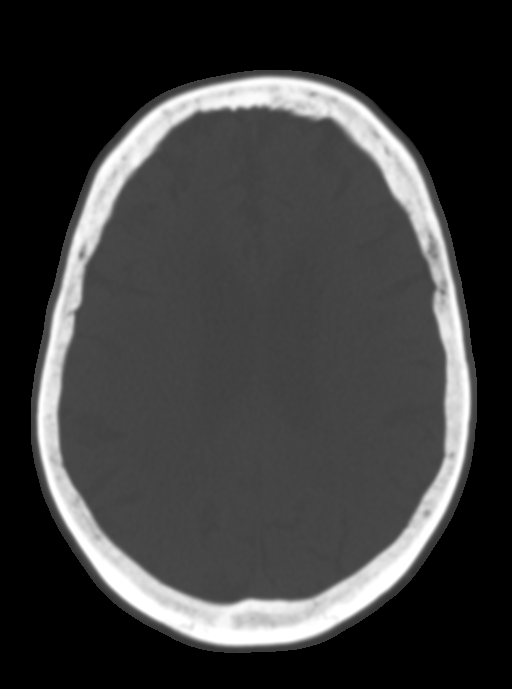
[im 36/57  brain]
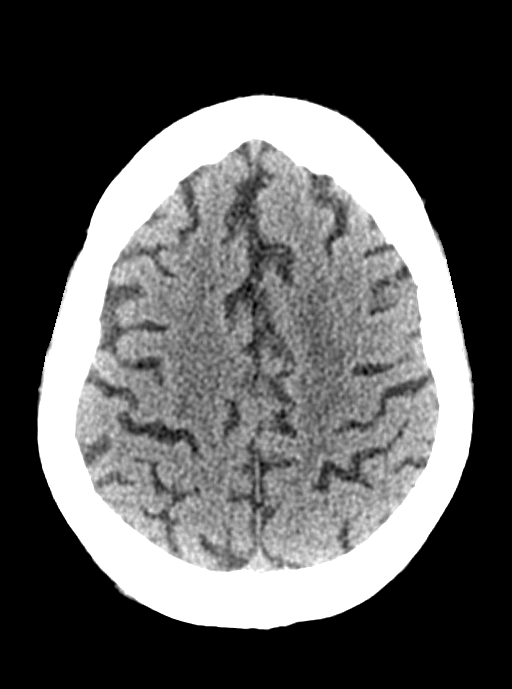
[im 46/57  brain]
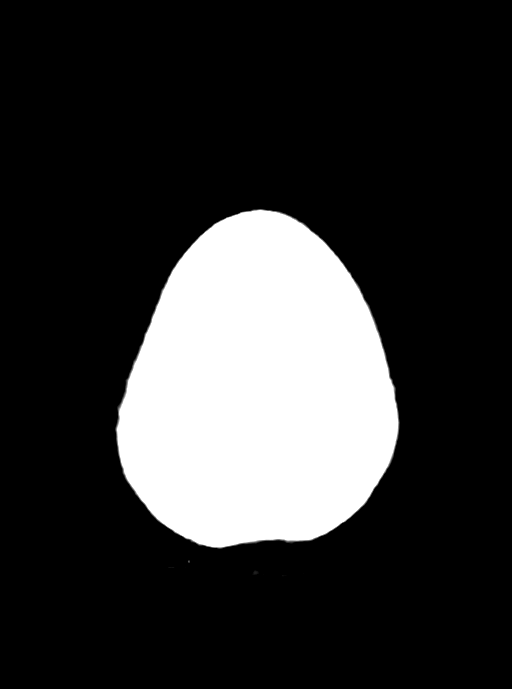
[im 51/57  brain]
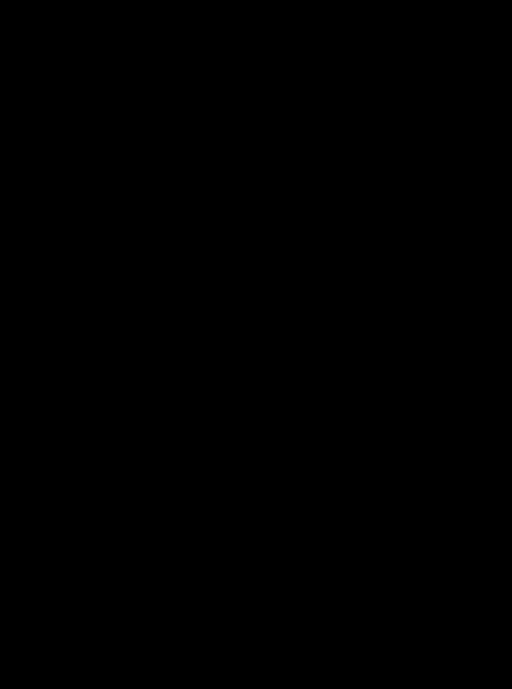

[14 of 47 positions shown; findings below may reference images not displayed]

FINDINGS: Brain: No evidence of acute territorial infarction, hemorrhage,
hydrocephalus,extra-axial collection or mass lesion/mass effect.
There is dilatation the ventricles and sulci consistent with
age-related atrophy. Low-attenuation changes in the deep white
matter consistent with small vessel ischemia.

Vascular: No hyperdense vessel or unexpected calcification.

Skull: The skull is intact. No fracture or focal lesion identified.

Sinuses/Orbits: The visualized paranasal sinuses and mastoid air
cells are clear. The orbits and globes intact.

Other: None

Cervical spine:

Multilevel cervical spine spondylosis is seen most notable at C6-C7
with moderate neural foraminal narrowing.
IMPRESSION: No acute intracranial abnormality.

Findings consistent with age related atrophy and chronic small
vessel ischemia

No acute fracture or malalignment of the spine.

## 2020-06-20 MED ORDER — SODIUM CHLORIDE 0.9 % IV BOLUS
500.0000 mL | Freq: Once | INTRAVENOUS | Status: AC
  Administered 2020-06-20: 500 mL via INTRAVENOUS

## 2020-06-20 NOTE — ED Triage Notes (Addendum)
Pt reports falling this morning. States that it was a mechanical fall. No LOC or head injury. Denies pain. Daughter said that she was on the floor most of the day.

## 2020-06-20 NOTE — ED Provider Notes (Signed)
[facility] DEPT Provider Note   CSN: [id number] Arrival date & time: 06/20/20  2053     History Chief Complaint  Patient presents with  . Fall    Madeline Burke is a 86 y.o. female.  Patient presents to the ED with a chief complaint of fall.  She is accompanied by her daughter, who reports that she found her down at home in the bathroom at about 6pm.  Patient states that she fell this morning.  She has been on the ground all day.  Patient states that she hasn't had any pain.  She states that she is hungry and tired.  She lives at home by herself.  She is not anticoagulated.  The history is provided by the patient. No language interpreter was used.       Past Medical History:  Diagnosis Date  . Diabetes mellitus without complication ([place])   . Elevated cholesterol   . History of angioedema 2006  . Hypertension     Patient Active Problem List   Diagnosis Date Noted  . Angio-edema 02/08/2018    Past Surgical History:  Procedure Laterality Date  . ABDOMINAL HYSTERECTOMY  1997  . BREAST CYST EXCISION Right 1997  . VENOUS ABLATION  04/06/2013   right leg      OB History   No obstetric history on file.     Family History  Problem Relation Age of Onset  . Diabetes Other   . Hyperlipidemia Other   . Hypertension Other     Social History   Tobacco Use  . Smoking status: Never Smoker  . Smokeless tobacco: Never Used  Substance Use Topics  . Alcohol use: No  . Drug use: Not Currently    Home Medications Prior to Admission medications   Medication Sig Start Date End Date Taking? Authorizing Provider  amLODipine (NORVASC) 2.5 MG tablet Take 2.5 mg by mouth daily. 01/04/18   [provider]  calcium carbonate (OSCAL) 1500 (600 Ca) MG TABS tablet Take 1,500 mg by mouth daily with breakfast.    [provider]  carvedilol (COREG) 25 MG tablet Take 25 mg by mouth 2 (two) times daily with a meal.  04/15/14   [provider]  Cholecalciferol (VITAMIN D3) 2000 units TABS Take 2,000 Units by mouth daily.    [provider]  citalopram (CELEXA) 10 MG tablet Take 10 mg by mouth daily. 04/15/14   [provider]  cycloSPORINE (RESTASIS) 0.05 % ophthalmic emulsion Place 1 drop into both eyes 2 (two) times daily.     [provider]  EPINEPHrine (EPIPEN 2-PAK) 0.3 mg/0.3 mL IJ SOAJ injection Inject 0.3 mLs into the muscle as needed for anaphylaxis.    [provider]  fexofenadine (ALLEGRA) 180 MG tablet Take 1 tablet (180 mg total) by mouth daily. 05/09/18 05/13/19  Valentina Shaggy, MD  Multiple Vitamin (MULTIVITAMIN WITH MINERALS) TABS tablet Take 1 tablet by mouth daily. Centrum Silver    [provider]  pioglitazone-metformin (ACTOPLUS MET) 15-850 MG per tablet Take 1 tablet by mouth daily.     [provider]  rosuvastatin (CRESTOR) 10 MG tablet Take 10 mg by mouth daily.    [provider]  triamcinolone cream (KENALOG) 0.1 % Apply 1 application topically 2 (two) times daily. 01/02/19   Valentina Shaggy, MD    Allergies    Aspirin, Bee venom, Famotidine, Fish allergy, Lisinopril, Penicillins, Shellfish allergy, Sulfa antibiotics, Sulfasalazine, and Flagyl [metronidazole]  Review of Systems   Review of Systems  All other systems reviewed and are negative.   Physical Exam Updated Vital Signs BP (!) 150/97 (BP Location: Right Arm)   Pulse 78   Temp 97.7 F (36.5 C) (Oral)   Resp 16   SpO2 98%   Physical Exam Vitals and nursing note reviewed.  Constitutional:      General: She is not in acute distress.    Appearance: She is well-developed and well-nourished.  HENT:     Head: Normocephalic and atraumatic.     Comments: Atraumatic  Eyes:     Conjunctiva/sclera: Conjunctivae normal.  Cardiovascular:     Rate and Rhythm: Normal rate and regular rhythm.     Heart sounds: No murmur heard.   Pulmonary:     Effort:  Pulmonary effort is normal. No respiratory distress.     Breath sounds: Normal breath sounds.  Abdominal:     Palpations: Abdomen is soft.     Tenderness: There is no abdominal tenderness.  Musculoskeletal:        General: No edema. Normal range of motion.     Cervical back: Neck supple.     Comments: Ambulates No bony deformity or abnormality Normal ROM  Skin:    General: Skin is warm and dry.  Neurological:     Mental Status: She is alert and oriented to person, place, and time.  Psychiatric:        Mood and Affect: Mood and affect and mood normal.        Behavior: Behavior normal.     ED Results / Procedures / Treatments   Labs (all labs ordered are listed, but only abnormal results are displayed) Labs Reviewed  CBC WITH DIFFERENTIAL/PLATELET  COMPREHENSIVE METABOLIC PANEL  CK  URINALYSIS, ROUTINE W REFLEX MICROSCOPIC    EKG None  Radiology No results found.  Procedures Procedures (including critical care time)  Medications Ordered in ED Medications  sodium chloride 0.9 % bolus 500 mL (has no administration in time range)    ED Course  I have reviewed the triage vital signs and the nursing notes.  Pertinent labs & imaging results that were available during my care of the patient were reviewed by me and considered in my medical decision making (see chart for details).    MDM Rules/Calculators/A&P                          This patient complains of fall at home this morning.  She is uncertain what caused the fall, but normally walks with a walker or a cane.  Given that she was down all day, this involves an extensive number of treatment options, and is a complaint that carries with it a high risk of complications and morbidity.    Differential Dx Dehydration, electrolyte derangement, rhabdomyolysis   Pertinent Labs I ordered, reviewed, and interpreted labs, which included CBC, CMP, CK, which are reassuring.  CK is elevated, but not significantly  so.  Imaging Interpretation I ordered imaging studies which included CT head and cervical spine, which showed no acute traumatic findings.   Medications I ordered medication NS for rehydration.  Sources Additional history obtained from daughter, who found patient.  States that she has had a few falls recently.  Lives at home.  Has chronic compression fractures in back.  Reassessments After the interventions stated above, I reevaluated the patient and found stable for discharge.  She stands and takes a  few steps without assistance.  Consultants none  Plan DC with PCP follow-up.    Final Clinical Impression(s) / ED Diagnoses Final diagnoses:  Fall, initial encounter    Rx / DC Orders ED Discharge Orders    None       Montine Circle, PA-C 06/21/20 0005    Drenda Freeze, MD 06/21/20 [phone]

## 2020-07-04 ENCOUNTER — Other Ambulatory Visit: Payer: Medicare Other

## 2020-07-14 DIAGNOSIS — R269 Unspecified abnormalities of gait and mobility: Secondary | ICD-10-CM | POA: Diagnosis not present

## 2020-07-14 DIAGNOSIS — E1165 Type 2 diabetes mellitus with hyperglycemia: Secondary | ICD-10-CM | POA: Diagnosis not present

## 2020-07-14 DIAGNOSIS — Z7189 Other specified counseling: Secondary | ICD-10-CM | POA: Diagnosis not present

## 2020-07-14 DIAGNOSIS — E785 Hyperlipidemia, unspecified: Secondary | ICD-10-CM | POA: Diagnosis not present

## 2020-07-14 DIAGNOSIS — J4520 Mild intermittent asthma, uncomplicated: Secondary | ICD-10-CM | POA: Diagnosis not present

## 2020-07-14 DIAGNOSIS — Z9181 History of falling: Secondary | ICD-10-CM | POA: Diagnosis not present

## 2020-07-14 DIAGNOSIS — Z1389 Encounter for screening for other disorder: Secondary | ICD-10-CM | POA: Diagnosis not present

## 2020-07-14 DIAGNOSIS — R197 Diarrhea, unspecified: Secondary | ICD-10-CM | POA: Diagnosis not present

## 2020-07-14 DIAGNOSIS — M47816 Spondylosis without myelopathy or radiculopathy, lumbar region: Secondary | ICD-10-CM | POA: Diagnosis not present

## 2020-07-14 DIAGNOSIS — Z0000 Encounter for general adult medical examination without abnormal findings: Secondary | ICD-10-CM | POA: Diagnosis not present

## 2020-07-14 DIAGNOSIS — I10 Essential (primary) hypertension: Secondary | ICD-10-CM | POA: Diagnosis not present

## 2020-08-01 ENCOUNTER — Ambulatory Visit
Admission: RE | Admit: 2020-08-01 | Discharge: 2020-08-01 | Disposition: A | Discharge status: Home / Self Care | Payer: Medicare Other | Source: Ambulatory Visit | Attending: Physician Assistant | Admitting: Physician Assistant

## 2020-08-01 DIAGNOSIS — R109 Unspecified abdominal pain: Secondary | ICD-10-CM | POA: Diagnosis not present

## 2020-08-01 DIAGNOSIS — R1013 Epigastric pain: Secondary | ICD-10-CM

## 2020-08-01 DIAGNOSIS — K5730 Diverticulosis of large intestine without perforation or abscess without bleeding: Secondary | ICD-10-CM | POA: Diagnosis not present

## 2020-08-01 IMAGING — CT CT ABD-PELV W/O CM
2 of 4 series · 11 of 46 positions shown, 12 images · non-contrast
Comparison: CT abdomen and pelvis [DATE]. MRI abdomen
[DATE].

CLINICAL DATA: Epigastric pain and diarrhea for 3 months.

EXAM:
CT ABDOMEN AND PELVIS WITHOUT CONTRAST
TECHNIQUE: Multidetector CT imaging of the abdomen and pelvis was performed
following the standard protocol without IV contrast.

[Series 2: routine abdomen pelvis without 5.00 br40 s3 axial · axial · non-contrast · 0.48mm/px · z∈[+1225,+1540]mm · 8 of 75 slices shown, 9 images]
[im 6/75  soft-tissue]
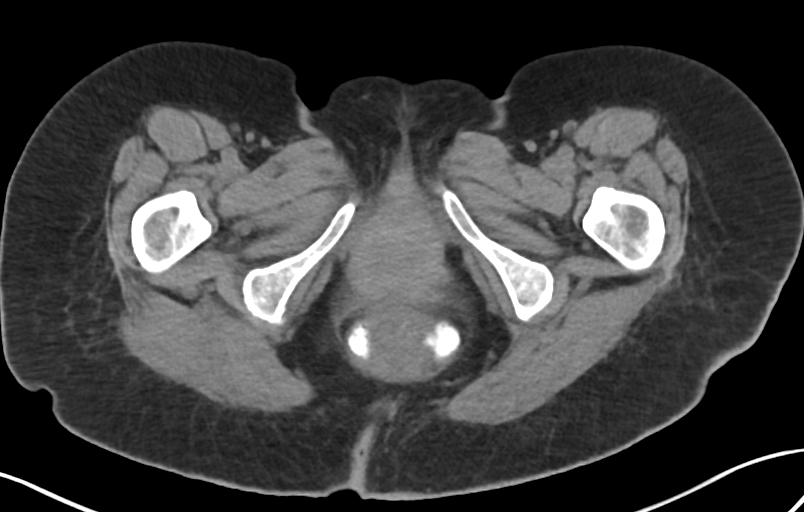
[im 6/75  bone]
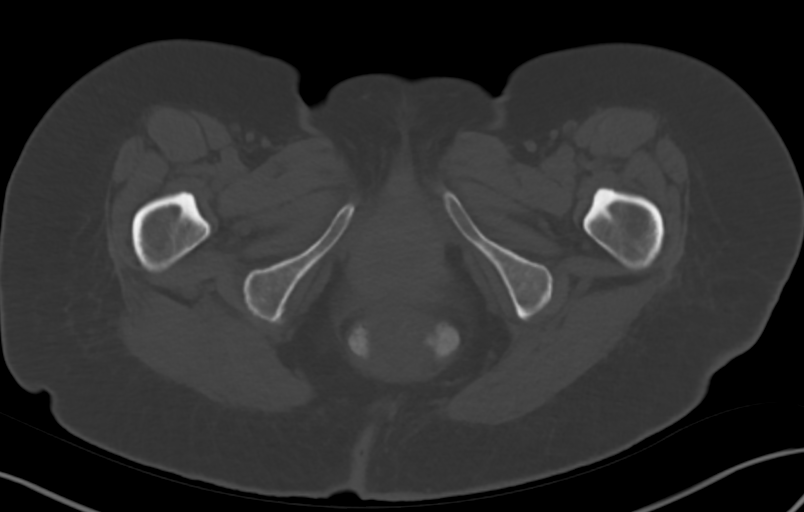
[im 15/75  soft-tissue]
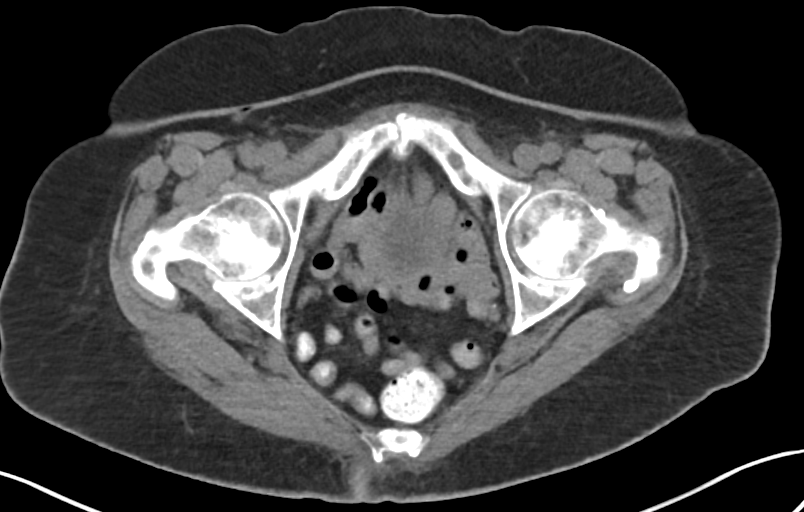
[im 23/75  soft-tissue]
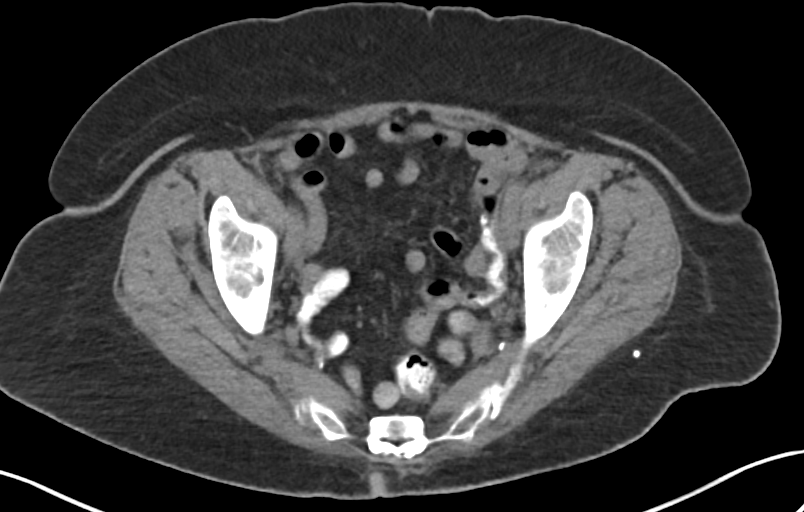
[im 32/75  soft-tissue]
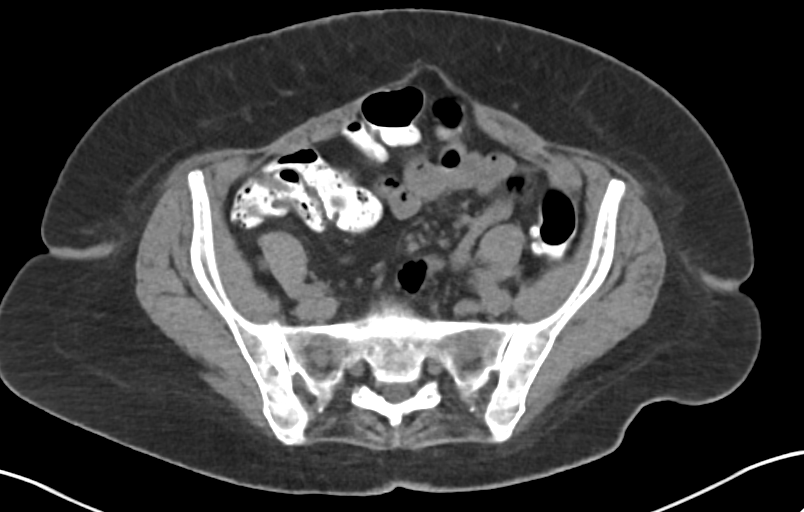
[im 43/75  soft-tissue]
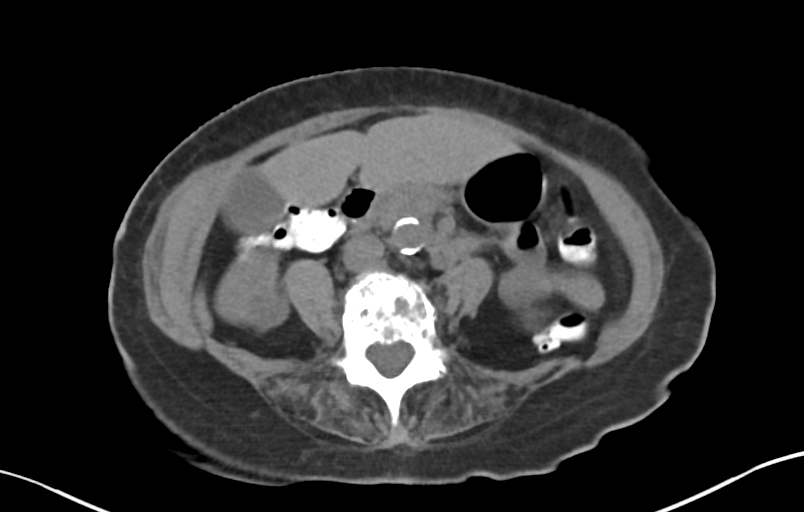
[im 52/75  soft-tissue]
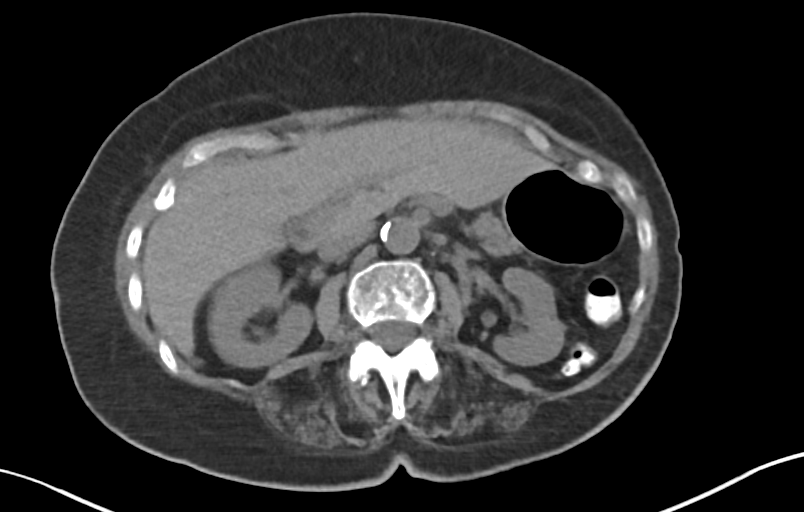
[im 60/75  soft-tissue]
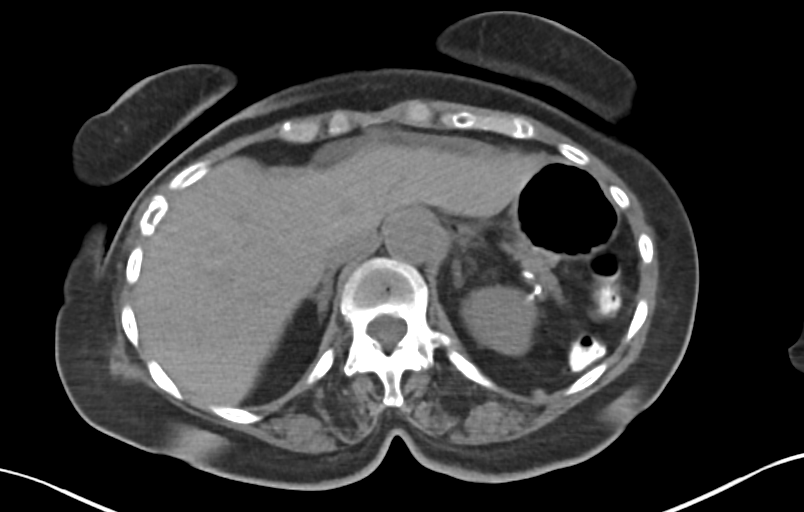
[im 69/75  soft-tissue]
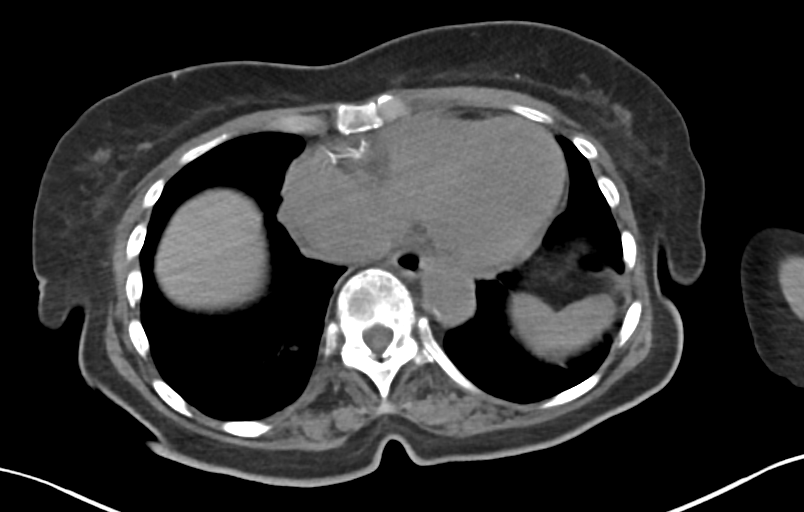

[Series 4: routine abdomen pelvis without 2.00 br40 s3 cor · coronal · non-contrast · 0.73mm/px · 3 of 123 slices shown]
[im 41/123  soft-tissue]
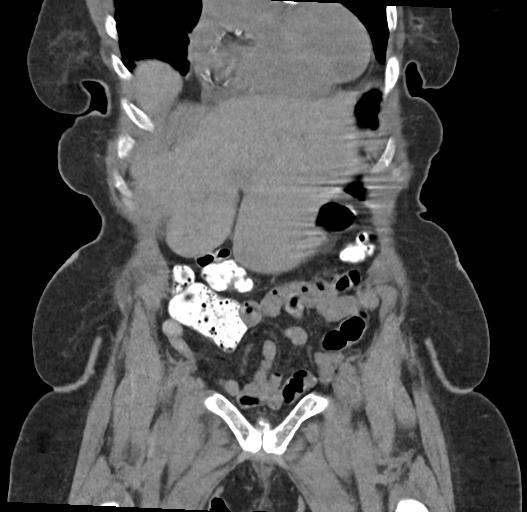
[im 55/123  soft-tissue]
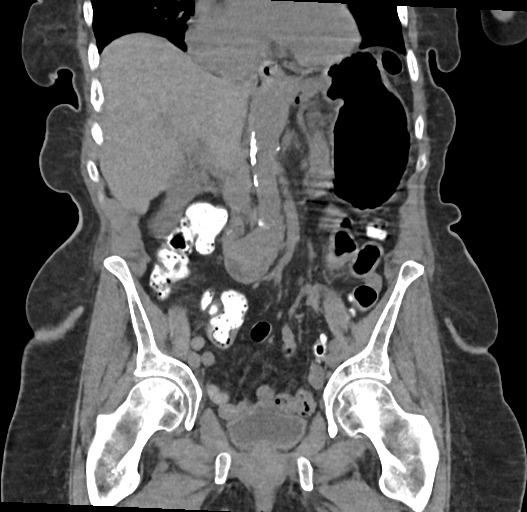
[im 68/123  soft-tissue]
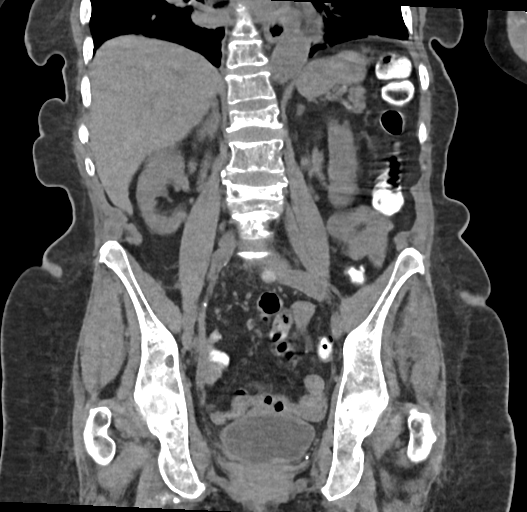

[11 of 46 positions shown; findings below may reference images not displayed]

FINDINGS: Lower chest: Minimal scarring or atelectasis in the lung bases. No
pleural effusion. Three-vessel coronary atherosclerosis.

Hepatobiliary: No focal liver abnormality is seen. No gallstones,
gallbladder wall thickening, or biliary dilatation.

Pancreas: Unremarkable.

Spleen: Unremarkable.

Adrenals/Urinary Tract: Unremarkable right adrenal gland. Unchanged
left adrenal gland thickening. The known complex cystic right lower
pole renal lesion is incompletely evaluated on today's unenhanced
study but has not appreciably enlarged, measuring approximately
cm. Other subcentimeter bilateral renal hypodensities on the prior
CT are poorly seen on today's unenhanced study. No renal calculi or
hydronephrosis. Unremarkable bladder.

Stomach/Bowel: The stomach is unremarkable. There is no evidence of
bowel obstruction or inflammation. Left-sided colonic diverticulosis
is noted without evidence of diverticulitis. The appendix is not
clearly identified, however no inflammatory changes are seen in the
right lower quadrant.

Vascular/Lymphatic: Abdominal aortic atherosclerosis without
aneurysm. No enlarged lymph nodes.

Reproductive: Status post hysterectomy. No adnexal masses.

Other: No intraperitoneal free fluid.

Musculoskeletal: Chronically heterogeneous appearance of the bones
including scattered discrete lucent lesions including in the
posterior aspect of the L5 vertebral body and in the lateral right
seventh rib. Chronic compression fractures in the lumbar and lower
thoracic spine including progressive, mild vertebral body height
loss at L2 since the prior CT.
IMPRESSION: 1. No acute abnormality identified in the abdomen or pelvis.
2. Chronically heterogeneous appearance of the bones with multiple
chronic compression fractures and scattered small discrete lucent
lesions. Consider laboratory evaluation for multiple myeloma.
3. Known complex right renal cystic lesion without interval
enlargement.
4. Aortic Atherosclerosis ([9X]-[9X]).

## 2020-08-05 DIAGNOSIS — D509 Iron deficiency anemia, unspecified: Secondary | ICD-10-CM | POA: Diagnosis not present

## 2020-08-05 DIAGNOSIS — N2889 Other specified disorders of kidney and ureter: Secondary | ICD-10-CM | POA: Diagnosis not present

## 2020-08-05 DIAGNOSIS — M899 Disorder of bone, unspecified: Secondary | ICD-10-CM | POA: Diagnosis not present

## 2020-08-05 DIAGNOSIS — N1832 Chronic kidney disease, stage 3b: Secondary | ICD-10-CM | POA: Diagnosis not present

## 2020-08-05 DIAGNOSIS — I129 Hypertensive chronic kidney disease with stage 1 through stage 4 chronic kidney disease, or unspecified chronic kidney disease: Secondary | ICD-10-CM | POA: Diagnosis not present

## 2020-08-05 DIAGNOSIS — E1122 Type 2 diabetes mellitus with diabetic chronic kidney disease: Secondary | ICD-10-CM | POA: Diagnosis not present

## 2020-08-05 DIAGNOSIS — R062 Wheezing: Secondary | ICD-10-CM | POA: Diagnosis not present

## 2020-08-07 DIAGNOSIS — R109 Unspecified abdominal pain: Secondary | ICD-10-CM | POA: Diagnosis not present

## 2020-08-07 DIAGNOSIS — R1012 Left upper quadrant pain: Secondary | ICD-10-CM | POA: Diagnosis not present

## 2020-08-15 ENCOUNTER — Telehealth: Payer: Self-pay | Admitting: Hematology and Oncology

## 2020-08-15 NOTE — Telephone Encounter (Signed)
Received a new pt referral from Dr. Goldsborough at [facility] for multiple myeloma. Madeline Burke has been scheduled to see Dr. Dorsey on 3/7 at 2pm. Appt date and time has been given to the pt's daughter, [name]. Aware to arrive 20 minutes early. 

## 2020-08-25 ENCOUNTER — Inpatient Hospital Stay: Payer: Medicare Other | Attending: Hematology and Oncology | Admitting: Hematology and Oncology

## 2020-08-25 ENCOUNTER — Inpatient Hospital Stay: Payer: Medicare Other

## 2020-08-25 ENCOUNTER — Other Ambulatory Visit: Payer: Self-pay

## 2020-08-25 VITALS — BP 138/61 | HR 69 | Temp 97.8°F | Resp 18 | Ht 62.0 in | Wt 120.8 lb

## 2020-08-25 DIAGNOSIS — Z90710 Acquired absence of both cervix and uterus: Secondary | ICD-10-CM | POA: Insufficient documentation

## 2020-08-25 DIAGNOSIS — R109 Unspecified abdominal pain: Secondary | ICD-10-CM | POA: Insufficient documentation

## 2020-08-25 DIAGNOSIS — Z79899 Other long term (current) drug therapy: Secondary | ICD-10-CM | POA: Diagnosis not present

## 2020-08-25 DIAGNOSIS — J45909 Unspecified asthma, uncomplicated: Secondary | ICD-10-CM | POA: Diagnosis not present

## 2020-08-25 DIAGNOSIS — Z8249 Family history of ischemic heart disease and other diseases of the circulatory system: Secondary | ICD-10-CM | POA: Diagnosis not present

## 2020-08-25 DIAGNOSIS — Z7984 Long term (current) use of oral hypoglycemic drugs: Secondary | ICD-10-CM | POA: Diagnosis not present

## 2020-08-25 DIAGNOSIS — E119 Type 2 diabetes mellitus without complications: Secondary | ICD-10-CM | POA: Insufficient documentation

## 2020-08-25 DIAGNOSIS — I10 Essential (primary) hypertension: Secondary | ICD-10-CM | POA: Insufficient documentation

## 2020-08-25 DIAGNOSIS — E785 Hyperlipidemia, unspecified: Secondary | ICD-10-CM | POA: Insufficient documentation

## 2020-08-25 DIAGNOSIS — E7800 Pure hypercholesterolemia, unspecified: Secondary | ICD-10-CM | POA: Diagnosis not present

## 2020-08-25 DIAGNOSIS — R768 Other specified abnormal immunological findings in serum: Secondary | ICD-10-CM | POA: Insufficient documentation

## 2020-08-25 DIAGNOSIS — Z8349 Family history of other endocrine, nutritional and metabolic diseases: Secondary | ICD-10-CM | POA: Diagnosis not present

## 2020-08-25 DIAGNOSIS — Z833 Family history of diabetes mellitus: Secondary | ICD-10-CM | POA: Insufficient documentation

## 2020-08-25 LAB — CBC WITH DIFFERENTIAL (CANCER CENTER ONLY)
Abs Immature Granulocytes: 0 10*3/uL (ref 0.00–0.07)
Basophils Absolute: 0.1 10*3/uL (ref 0.0–0.1)
Basophils Relative: 1 %
Eosinophils Absolute: 0 10*3/uL (ref 0.0–0.5)
Eosinophils Relative: 0 %
HCT: 28.4 % — ABNORMAL LOW (ref 36.0–46.0)
Hemoglobin: 9.1 g/dL — ABNORMAL LOW (ref 12.0–15.0)
Lymphocytes Relative: 56 %
Lymphs Abs: 6.2 10*3/uL — ABNORMAL HIGH (ref 0.7–4.0)
MCH: 28.3 pg (ref 26.0–34.0)
MCHC: 32 g/dL (ref 30.0–36.0)
MCV: 88.2 fL (ref 80.0–100.0)
Monocytes Absolute: 0.2 10*3/uL (ref 0.1–1.0)
Monocytes Relative: 2 %
Neutro Abs: 4.5 10*3/uL (ref 1.7–7.7)
Neutrophils Relative %: 41 %
Platelet Count: 134 10*3/uL — ABNORMAL LOW (ref 150–400)
RBC: 3.22 MIL/uL — ABNORMAL LOW (ref 3.87–5.11)
RDW: 17.4 % — ABNORMAL HIGH (ref 11.5–15.5)
WBC Count: 11 10*3/uL — ABNORMAL HIGH (ref 4.0–10.5)
nRBC: 0 % (ref 0.0–0.2)

## 2020-08-25 LAB — CMP (CANCER CENTER ONLY)
ALT: 14 U/L (ref 0–44)
AST: 24 U/L (ref 15–41)
Albumin: 4 g/dL (ref 3.5–5.0)
Alkaline Phosphatase: 80 U/L (ref 38–126)
Anion gap: 13 (ref 5–15)
BUN: 23 mg/dL (ref 8–23)
CO2: 24 mmol/L (ref 22–32)
Calcium: 9.7 mg/dL (ref 8.9–10.3)
Chloride: 107 mmol/L (ref 98–111)
Creatinine: 1.97 mg/dL — ABNORMAL HIGH (ref 0.44–1.00)
GFR, Estimated: 25 mL/min — ABNORMAL LOW (ref >60)
Glucose, Bld: 97 mg/dL (ref 70–99)
Potassium: 4.1 mmol/L (ref 3.5–5.1)
Sodium: 144 mmol/L (ref 135–145)
Total Bilirubin: 0.4 mg/dL (ref 0.3–1.2)
Total Protein: 7.7 g/dL (ref 6.5–8.1)

## 2020-08-25 LAB — LACTATE DEHYDROGENASE: LDH: 268 U/L — ABNORMAL HIGH (ref 98–192)

## 2020-08-25 NOTE — Progress Notes (Signed)
[place] [phone]   Fax:([phone] [place] NOTE  Patient Care Team: Leeroy Cha, MD as PCP - General (Internal Medicine)  Hematological/Oncological History #Elevated Serum Free Light Chains, concern for Multiple Myeloma. 1) 08/09/2020:Evaluated by [facility] to due to rise in creatinine levels from 0.93 in January 2021 to 1.37 in January 2022.   Marland Kitchen M-spike 0.2 g/dL, Free Kappa Lt Chain 6804.5 mg/L, Kappa/Lambda ratio >4536.33. 2) 08/25/2020: Establish care with Dr. Lorenso Courier.   CHIEF COMPLAINTS/PURPOSE OF CONSULTATION:  "Elevated serum immunoglobulin free light chain level"  HISTORY OF PRESENTING ILLNESS:  Madeline Burke 87 y.o. female with medical history significant for HTN, hyperlipidemia, T2DM, and asthma who presents to the clinic for concern for elevated serum immunoglobulin free light chain level.   Madeline Burke is accompanied by her daughter, Madeline Burke, for this visit. She reports that her energy levels are stable. She uses a cane to ambulate at home where she lives alone. She can do her basic ADLs on her own including bathing and getting dressed. Her three daughters assist with other activities. Patient reports decreased appetite with noted weight loss (she cannot tell how much she lost). She denies any nausea or vomiting. Patient reports intermittent episodes of mid abdominal pain that she rates 8 out of 10 on a pain scale. She is uncertain of triggers that cause the abdominal pain. Currently she takes tylenol (occasionally tramadol) with improvement of the pain. She generally has daily bowel movements with occasional episodes of diarrhea. She denies any hematochezia or melena. Patient denies any fevers, chills, night sweats, acute shortness of breath, chest pain, dysuria, hematuria, bone pain, or other skin changes. She has no other complaints and rest of the 10 point ROS is negative.   MEDICAL HISTORY:  Past Medical  History:  Diagnosis Date   Diabetes mellitus without complication (HCC)    Elevated cholesterol    History of angioedema 2006   Hypertension     SURGICAL HISTORY: Past Surgical History:  Procedure Laterality Date   ABDOMINAL HYSTERECTOMY  1997   BREAST CYST EXCISION Right 1997   VENOUS ABLATION  04/06/2013   right leg     SOCIAL HISTORY: Social History   Socioeconomic History   Marital status: Divorced    Spouse name: Not on file   Number of children: Not on file   Years of education: Not on file   Highest education level: Not on file  Occupational History   Not on file  Tobacco Use   Smoking status: Never Smoker   Smokeless tobacco: Never Used  Substance and Sexual Activity   Alcohol use: No   Drug use: Not Currently   Sexual activity: Yes    Birth control/protection: Surgical  Other Topics Concern   Not on file  Social History Narrative   Not on file   Social Determinants of Health   Financial Resource Strain: Not on file  Food Insecurity: Not on file  Transportation Needs: Not on file  Physical Activity: Not on file  Stress: Not on file  Social Connections: Not on file  Intimate Partner Violence: Not on file    FAMILY HISTORY: Family History  Problem Relation Age of Onset   Diabetes Other    Hyperlipidemia Other    Hypertension Other     ALLERGIES:  is allergic to aspirin, bee venom, famotidine, fish allergy, fish-derived products, lisinopril, other, penicillamine, penicillins, shellfish allergy, sulfa antibiotics, sulfasalazine, and flagyl [metronidazole].  MEDICATIONS:  Current Outpatient  Medications  Medication Sig Dispense Refill   mirtazapine (REMERON) 15 MG tablet Take 15 mg by mouth at bedtime.     amLODipine (NORVASC) 2.5 MG tablet Take 2.5 mg by mouth daily.  1   carvedilol (COREG) 25 MG tablet Take 25 mg by mouth 2 (two) times daily with a meal.   2   Cholecalciferol (VITAMIN D3) 2000 units TABS Take 2,000  Units by mouth daily.     cycloSPORINE (RESTASIS) 0.05 % ophthalmic emulsion Place 1 drop into both eyes 2 (two) times daily.      EPINEPHrine (EPIPEN 2-PAK) 0.3 mg/0.3 mL IJ SOAJ injection Inject 0.3 mLs into the muscle as needed for anaphylaxis.     fexofenadine (ALLEGRA) 180 MG tablet Take 1 tablet (180 mg total) by mouth daily. 90 tablet 5   Multiple Vitamin (MULTIVITAMIN WITH MINERALS) TABS tablet Take 1 tablet by mouth daily. Centrum Silver     pioglitazone-metformin (ACTOPLUS MET) 15-850 MG per tablet Take 1 tablet by mouth daily.      rosuvastatin (CRESTOR) 10 MG tablet Take 10 mg by mouth daily.     triamcinolone cream (KENALOG) 0.1 % Apply 1 application topically 2 (two) times daily. 60 g 0   No current facility-administered medications for this visit.    REVIEW OF SYSTEMS:   Constitutional: ( - ) fevers, ( - )  chills , ( - ) night sweats Eyes: ( - ) blurriness of vision, ( - ) double vision, ( - ) watery eyes Ears, nose, mouth, throat, and face: ( - ) mucositis, ( - ) sore throat Respiratory: ( - ) cough, ( - ) dyspnea, ( - ) wheezes Cardiovascular: ( - ) palpitation, ( - ) chest discomfort, ( - ) lower extremity swelling Gastrointestinal:  ( - ) nausea, ( - ) heartburn, ( - ) change in bowel habits Skin: ( - ) abnormal skin rashes Lymphatics: ( - ) new lymphadenopathy, ( - ) easy bruising Neurological: ( - ) numbness, ( - ) tingling, ( - ) new weaknesses Behavioral/Psych: ( - ) mood change, ( - ) new changes  All other systems were reviewed with the patient and are negative.  PHYSICAL EXAMINATION: ECOG PERFORMANCE STATUS: 1 - Symptomatic but completely ambulatory  Vitals:   08/25/20 1414  BP: 138/61  Pulse: 69  Resp: 18  Temp: 97.8 F (36.6 C)  SpO2: 99%   Filed Weights   08/25/20 1414  Weight: 120 lb 12.8 oz (54.8 kg)    GENERAL: well appearing female in NAD. Patient was in a wheelchair during entire visit.  SKIN: skin color, texture, turgor are normal,  no rashes or significant lesions EYES: conjunctiva are pink and non-injected, sclera clear OROPHARYNX: no exudate, no erythema; lips, buccal mucosa, and tongue normal  NECK: supple, non-tender LYMPH:  no palpable lymphadenopathy in the cervical, axillary or supraclavicular lymph nodes.  LUNGS: clear to auscultation and percussion with normal breathing effort HEART: regular rate & rhythm and no lower extremity edema. Murmur present.  ABDOMEN: soft, non-tender, non-distended, normal bowel sounds Musculoskeletal: no cyanosis of digits and no clubbing  PSYCH: alert & oriented x 3, fluent speech NEURO: no focal motor/sensory deficits  LABORATORY DATA:  I have reviewed the data as listed CBC Latest Ref Rng & Units 06/20/2020 05/13/2019 07/14/2016  WBC 4.0 - 10.5 K/uL 4.8 4.4 6.3  Hemoglobin 12.0 - 15.0 g/dL 11.2(L) 12.1 10.8(L)  Hematocrit 36.0 - 46.0 % 34.4(L) 38.7 35.1(L)  Platelets 150 - 400 K/uL  217 PLATELET CLUMPS NOTED ON SMEAR, COUNT APPEARS DECREASED 179    CMP Latest Ref Rng & Units 06/20/2020 05/13/2019 07/14/2016  Glucose 70 - 99 mg/dL 100(H) 101(H) 117(H)  BUN 8 - 23 mg/dL 12 19 13   Creatinine 0.44 - 1.00 mg/dL 0.92 1.00 0.81  Sodium 135 - 145 mmol/L 141 140 138  Potassium 3.5 - 5.1 mmol/L 3.8 3.9 4.0  Chloride 98 - 111 mmol/L 105 104 103  CO2 22 - 32 mmol/L 22 25 24   Calcium 8.9 - 10.3 mg/dL 9.8 9.6 9.1  Total Protein 6.5 - 8.1 g/dL 7.2 - 6.2(L)  Total Bilirubin 0.3 - 1.2 mg/dL 0.8 - 0.7  Alkaline Phos 38 - 126 U/L 66 - 61  AST 15 - 41 U/L 45(H) - 30  ALT 0 - 44 U/L 23 - 18    RADIOGRAPHIC STUDIES: I have personally reviewed the radiological images as listed and agreed with the findings in the report. CT ABDOMEN PELVIS WO CONTRAST  Result Date: 08/01/2020 CLINICAL DATA:  Epigastric pain and diarrhea for 3 months. EXAM: CT ABDOMEN AND PELVIS WITHOUT CONTRAST TECHNIQUE: Multidetector CT imaging of the abdomen and pelvis was performed following the standard protocol without  IV contrast. COMPARISON:  CT abdomen and pelvis 08/24/2019. MRI abdomen 02/26/2020. FINDINGS: Lower chest: Minimal scarring or atelectasis in the lung bases. No pleural effusion. Three-vessel coronary atherosclerosis. Hepatobiliary: No focal liver abnormality is seen. No gallstones, gallbladder wall thickening, or biliary dilatation. Pancreas: Unremarkable. Spleen: Unremarkable. Adrenals/Urinary Tract: Unremarkable right adrenal gland. Unchanged left adrenal gland thickening. The known complex cystic right lower pole renal lesion is incompletely evaluated on today's unenhanced study but has not appreciably enlarged, measuring approximately 2.1 cm. Other subcentimeter bilateral renal hypodensities on the prior CT are poorly seen on today's unenhanced study. No renal calculi or hydronephrosis. Unremarkable bladder. Stomach/Bowel: The stomach is unremarkable. There is no evidence of bowel obstruction or inflammation. Left-sided colonic diverticulosis is noted without evidence of diverticulitis. The appendix is not clearly identified, however no inflammatory changes are seen in the right lower quadrant. Vascular/Lymphatic: Abdominal aortic atherosclerosis without aneurysm. No enlarged lymph nodes. Reproductive: Status post hysterectomy. No adnexal masses. Other: No intraperitoneal free fluid. Musculoskeletal: Chronically heterogeneous appearance of the bones including scattered discrete lucent lesions including in the posterior aspect of the L5 vertebral body and in the lateral right seventh rib. Chronic compression fractures in the lumbar and lower thoracic spine including progressive, mild vertebral body height loss at L2 since the prior CT. IMPRESSION: 1. No acute abnormality identified in the abdomen or pelvis. 2. Chronically heterogeneous appearance of the bones with multiple chronic compression fractures and scattered small discrete lucent lesions. Consider laboratory evaluation for multiple myeloma. 3. Known  complex right renal cystic lesion without interval enlargement. 4. Aortic Atherosclerosis (ICD10-I70.0). Electronically Signed   By: Logan Bores M.D.   On: 08/01/2020 10:34    ASSESSMENT & PLAN Madeline Burke is a 87 y.o. female presenting to the clinic for elevated serum immunoglobulin level concerning for multiple myeloma. Discussed additional diagnostic studies are required for confirmation of multiple myeloma including bone marrow biopsy, metastatic skeletal survey and labs (CBC, CMP, LDH, Beta-2 microglobulin) and UPEP. Tentative plan for patient to return in 2-3 weeks for finalize diagnosis and treatment recommendations.   # Elevated serum immunoglobulin level  --Highly suspicious for multiple myeloma but will complete studies as mentioned above to confirm.  --RTC in 2-3 weeks   # Abdominal pain: --Uncertain etiology and unlikely related to multiple  myeloma.  --CT abdomen/pelvis from 08/01/20 showed no acute abnormality that explains the pain.  --Currently takes Tylenol and Tramadol PRN --Recommend to follow up with PCP.    No orders of the defined types were placed in this encounter.   All questions were answered. The patient knows to call the clinic with any problems, questions or concerns.  A total of more than 60 minutes were spent on this encounter and over half of that time was spent on counseling and coordination of care as outlined above.    Ledell Peoples, MD Department of Hematology/Oncology [place] [facility] Phone: [phone] Pager: [phone] Email: [email or web address]  08/25/2020 2:26 PM

## 2020-08-26 ENCOUNTER — Telehealth: Payer: Self-pay | Admitting: Hematology and Oncology

## 2020-08-26 ENCOUNTER — Encounter: Payer: Self-pay | Admitting: Hematology and Oncology

## 2020-08-26 LAB — BETA 2 MICROGLOBULIN, SERUM: Beta-2 Microglobulin: 8.7 mg/L — ABNORMAL HIGH (ref 0.6–2.4)

## 2020-08-26 NOTE — Telephone Encounter (Signed)
Scheduled per los. Called and left msg. Mailed printout  °

## 2020-09-02 DIAGNOSIS — R062 Wheezing: Secondary | ICD-10-CM | POA: Diagnosis not present

## 2020-09-03 ENCOUNTER — Ambulatory Visit (HOSPITAL_COMMUNITY)
Admission: RE | Admit: 2020-09-03 | Discharge: 2020-09-03 | Disposition: A | Discharge status: Home / Self Care | Payer: Medicare Other | Source: Ambulatory Visit | Attending: Physician Assistant | Admitting: Physician Assistant

## 2020-09-03 ENCOUNTER — Other Ambulatory Visit: Payer: Self-pay

## 2020-09-03 DIAGNOSIS — R768 Other specified abnormal immunological findings in serum: Secondary | ICD-10-CM | POA: Insufficient documentation

## 2020-09-03 DIAGNOSIS — Z0389 Encounter for observation for other suspected diseases and conditions ruled out: Secondary | ICD-10-CM | POA: Diagnosis not present

## 2020-09-03 IMAGING — DX DG BONE SURVEY MET
8 of 10 series · 8 of 10 positions shown · non-contrast
Comparison: [DATE].

CLINICAL DATA: Possible multiple myeloma.

EXAM:
METASTATIC BONE SURVEY

[skull lat]
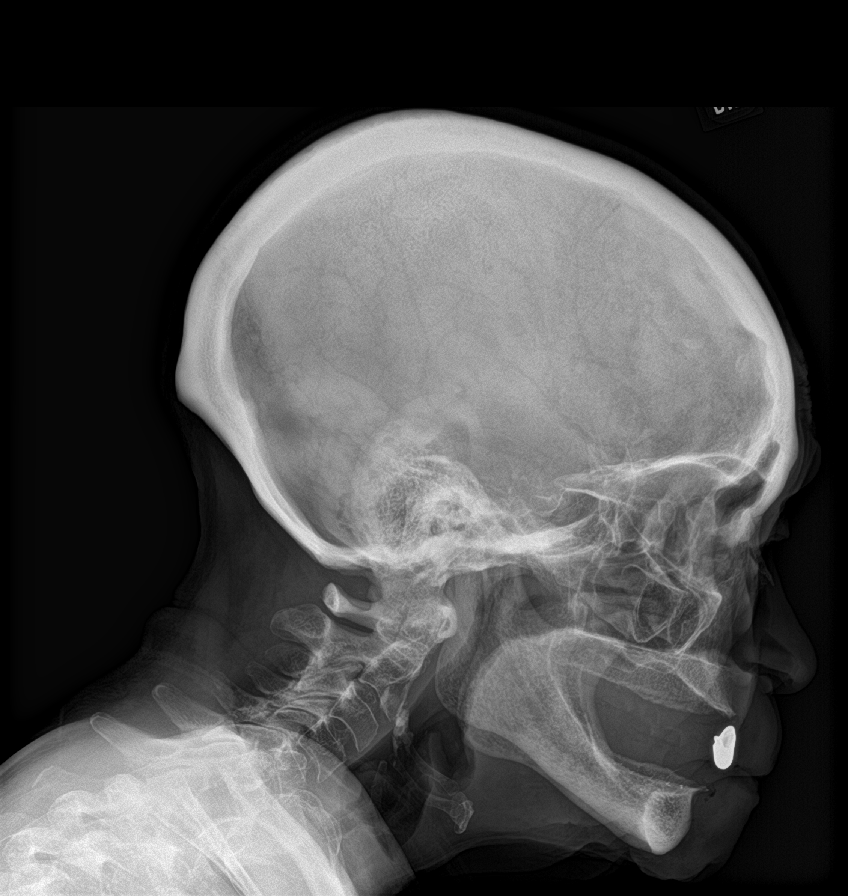

[shoulder ap (1 of 2)]
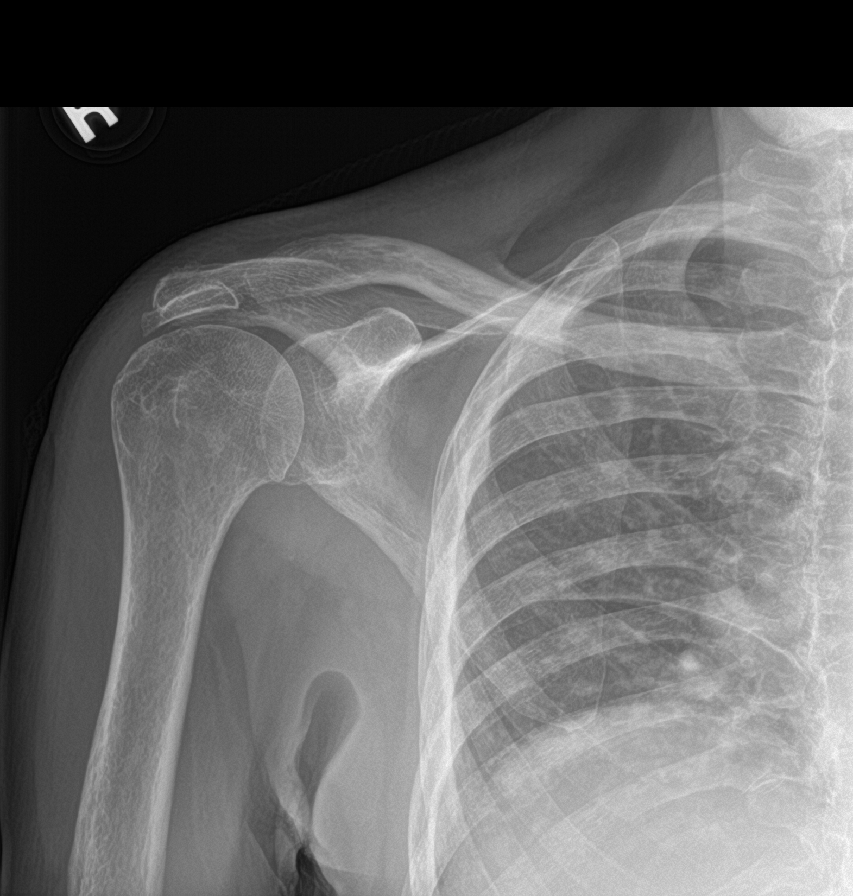

[shoulder ap (2 of 2)]
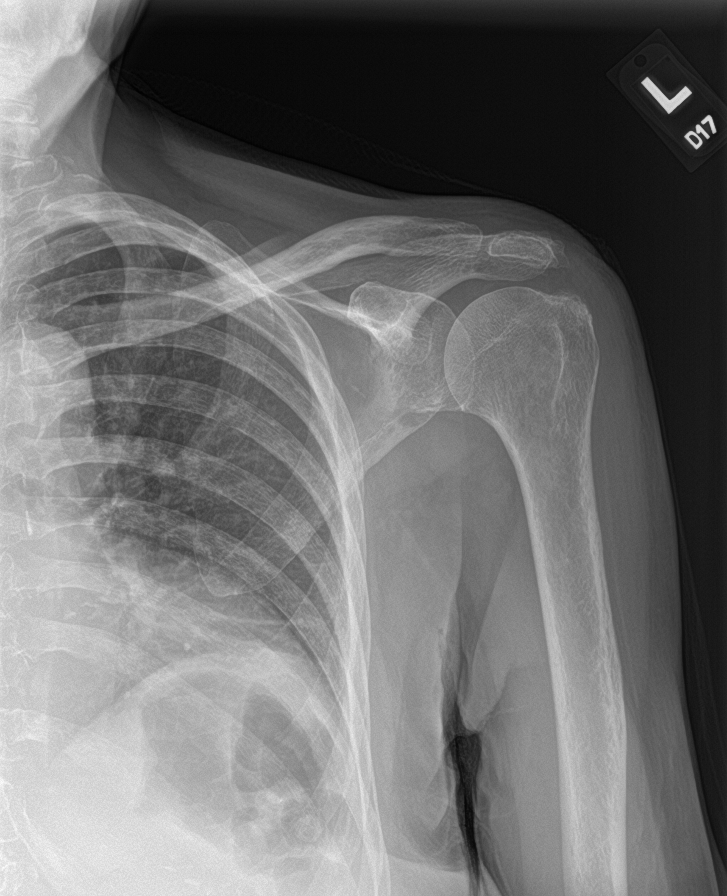

[humerus ap (1 of 2)]
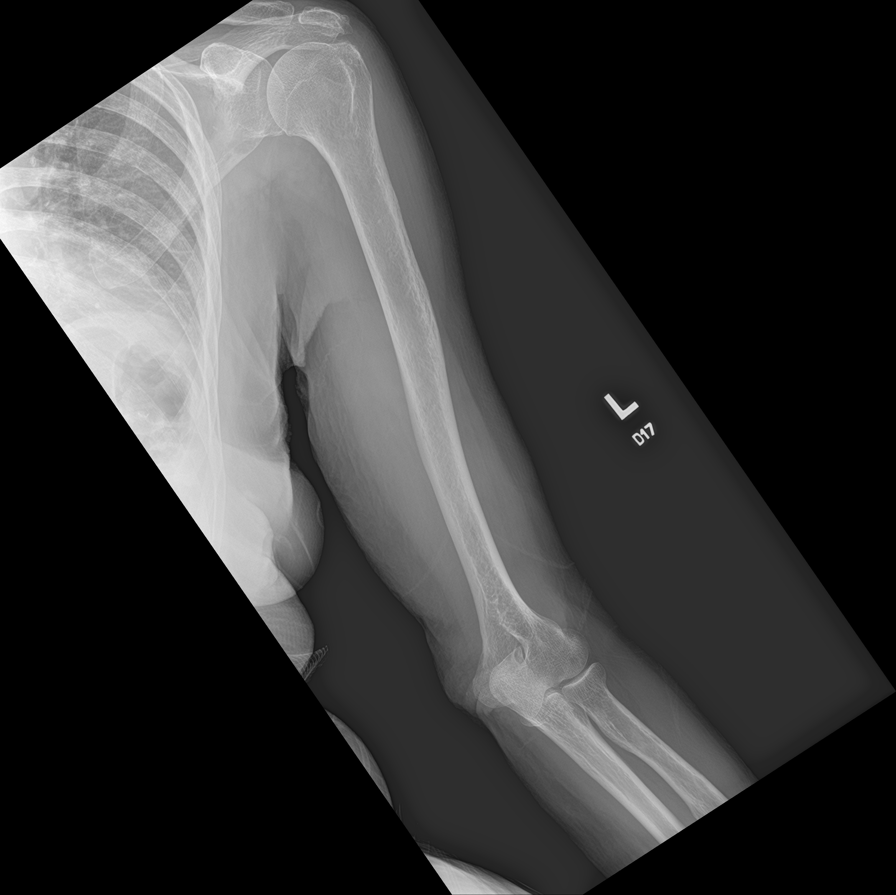

[humerus ap (2 of 2)]
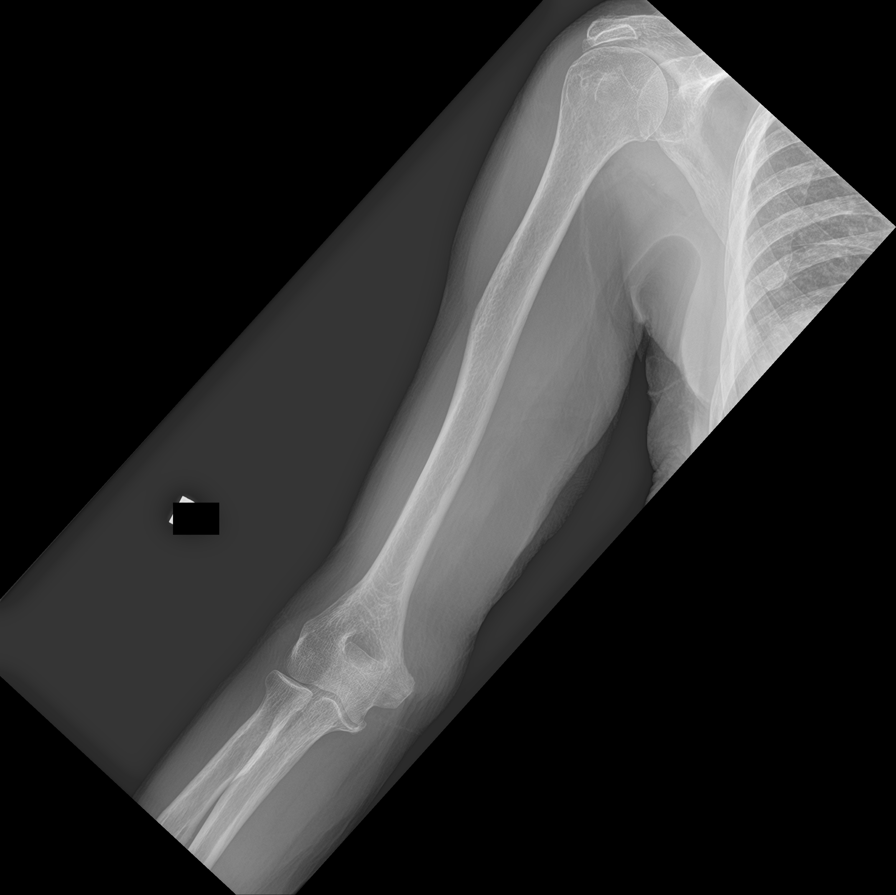

[forearm ap (1 of 2)]
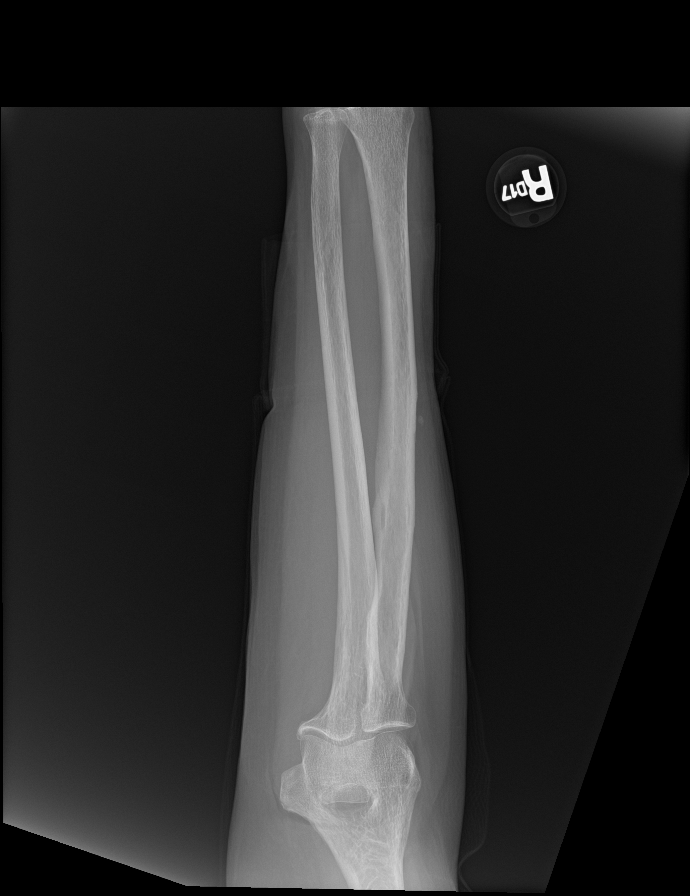

[forearm ap (2 of 2)]
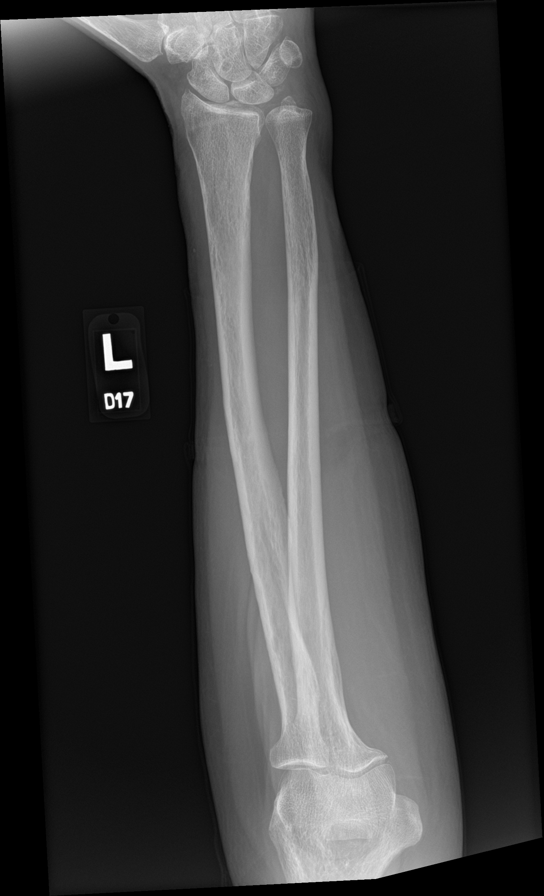

[c-spine ap]
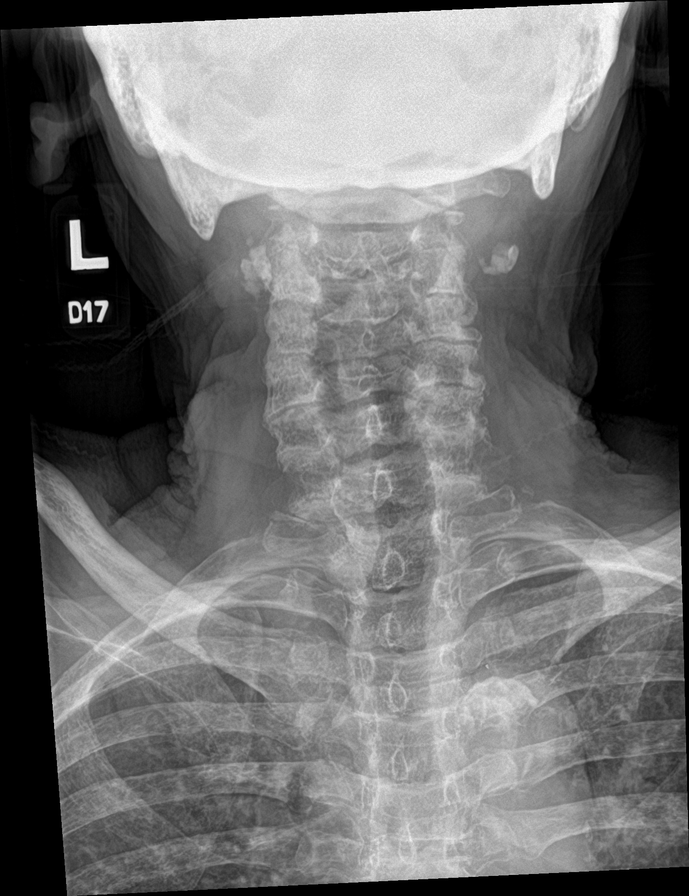

[8 of 10 positions shown; findings below may reference images not displayed]

FINDINGS: Multiple old compression fractures are noted in the thoracic and
lumbar spine. Degenerative changes are seen involving both
sacroiliac joints. Probable bilateral carotid artery calcifications
are noted. No definite lytic or sclerotic lesions are noted.
IMPRESSION: 1. Multiple old compression fractures are noted in the thoracic and
lumbar spine. No definite lytic or sclerotic lesions are noted.
2. Probable bilateral carotid artery calcifications. Carotid
ultrasound is recommended for further evaluation.

## 2020-09-05 ENCOUNTER — Other Ambulatory Visit: Payer: Self-pay | Admitting: Radiology

## 2020-09-05 NOTE — Progress Notes (Signed)
BM Bx scheduled for 3/21 and RTC on 3/28.

## 2020-09-08 ENCOUNTER — Telehealth: Payer: Self-pay

## 2020-09-08 ENCOUNTER — Encounter (HOSPITAL_COMMUNITY): Payer: Self-pay

## 2020-09-08 ENCOUNTER — Ambulatory Visit (HOSPITAL_COMMUNITY)
Admission: RE | Admit: 2020-09-08 | Discharge: 2020-09-08 | Disposition: A | Discharge status: Home / Self Care | Payer: Medicare Other | Source: Ambulatory Visit | Attending: Physician Assistant | Admitting: Physician Assistant

## 2020-09-08 ENCOUNTER — Other Ambulatory Visit: Payer: Self-pay

## 2020-09-08 DIAGNOSIS — R768 Other specified abnormal immunological findings in serum: Secondary | ICD-10-CM

## 2020-09-08 DIAGNOSIS — C9000 Multiple myeloma not having achieved remission: Secondary | ICD-10-CM | POA: Diagnosis not present

## 2020-09-08 DIAGNOSIS — D47Z9 Other specified neoplasms of uncertain behavior of lymphoid, hematopoietic and related tissue: Secondary | ICD-10-CM | POA: Diagnosis not present

## 2020-09-08 DIAGNOSIS — D696 Thrombocytopenia, unspecified: Secondary | ICD-10-CM | POA: Diagnosis not present

## 2020-09-08 DIAGNOSIS — D6489 Other specified anemias: Secondary | ICD-10-CM | POA: Diagnosis not present

## 2020-09-08 LAB — CBC WITH DIFFERENTIAL/PLATELET
Abs Immature Granulocytes: 0.03 10*3/uL (ref 0.00–0.07)
Basophils Absolute: 0.1 10*3/uL (ref 0.0–0.1)
Basophils Relative: 2 %
Eosinophils Absolute: 0 10*3/uL (ref 0.0–0.5)
Eosinophils Relative: 1 %
HCT: 28.1 % — ABNORMAL LOW (ref 36.0–46.0)
Hemoglobin: 8.8 g/dL — ABNORMAL LOW (ref 12.0–15.0)
Immature Granulocytes: 1 %
Lymphocytes Relative: 44 %
Lymphs Abs: 2.5 10*3/uL (ref 0.7–4.0)
MCH: 28.1 pg (ref 26.0–34.0)
MCHC: 31.3 g/dL (ref 30.0–36.0)
MCV: 89.8 fL (ref 80.0–100.0)
Monocytes Absolute: 1.5 10*3/uL — ABNORMAL HIGH (ref 0.1–1.0)
Monocytes Relative: 28 %
Neutro Abs: 1.3 10*3/uL — ABNORMAL LOW (ref 1.7–7.7)
Neutrophils Relative %: 24 %
Platelets: 148 10*3/uL — ABNORMAL LOW (ref 150–400)
RBC: 3.13 MIL/uL — ABNORMAL LOW (ref 3.87–5.11)
RDW: 18.2 % — ABNORMAL HIGH (ref 11.5–15.5)
WBC: 5.5 10*3/uL (ref 4.0–10.5)
nRBC: 0 % (ref 0.0–0.2)

## 2020-09-08 LAB — GLUCOSE, CAPILLARY: Glucose-Capillary: 93 mg/dL (ref 70–99)

## 2020-09-08 LAB — APTT: aPTT: 39 seconds — ABNORMAL HIGH (ref 24–36)

## 2020-09-08 IMAGING — CT CT BIOPSY AND ASPIRATION BONE MARROW
1 of 2 series · 15 of 30 positions shown, 19 images · non-contrast
Comparison: none

INDICATION: ELEVATED SERUM IMMUNOGLOBULIN, CONCERN FOR MYELOMA

[Series 2: i-spiral 5.0 br40 · axial · 0.95mm/px · z∈[+1120,+1243]mm · 15 of 39 slices shown, 19 images]
[im 2/39  mediastinal]
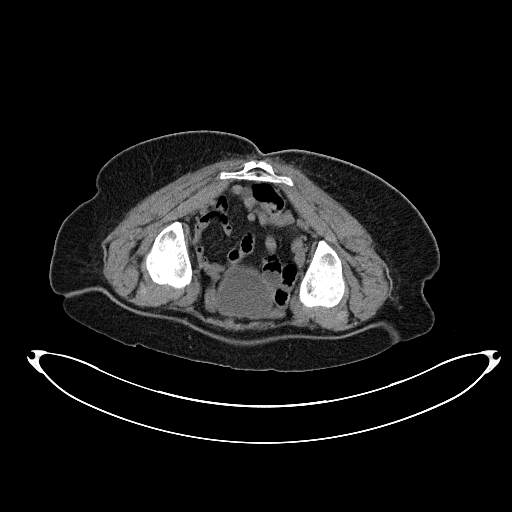
[im 2/39  lung]
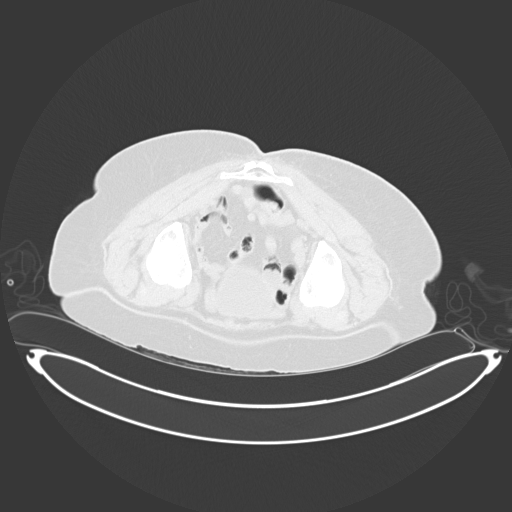
[im 5/39  lung]
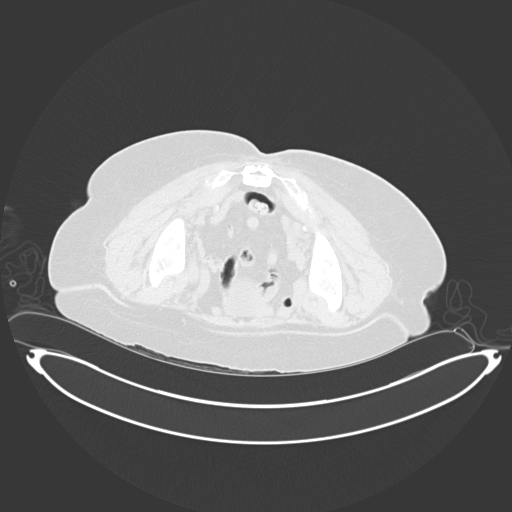
[im 7/39  lung]
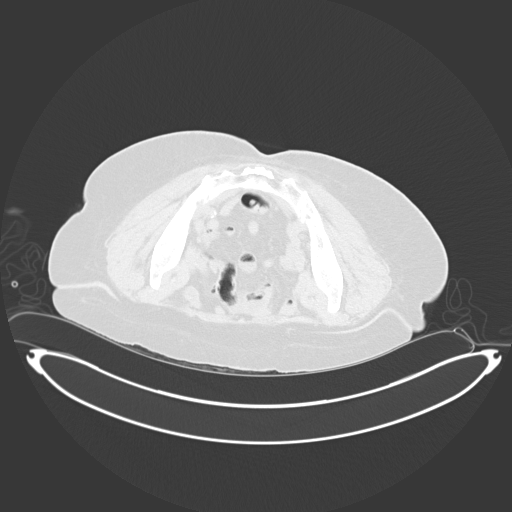
[im 10/39  lung]
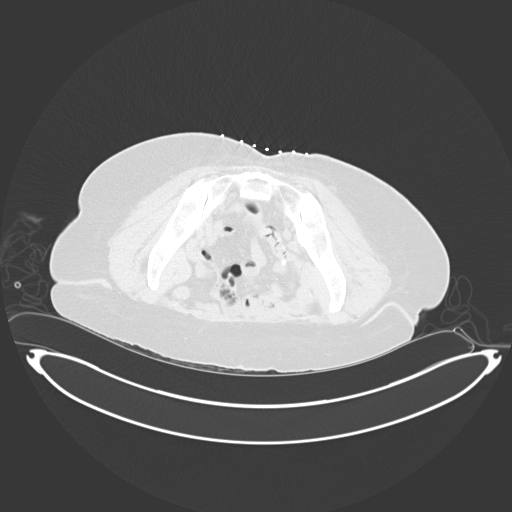
[im 12/39  mediastinal]
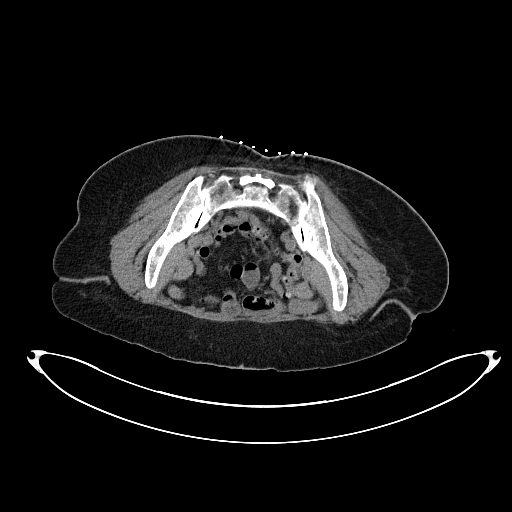
[im 12/39  lung]
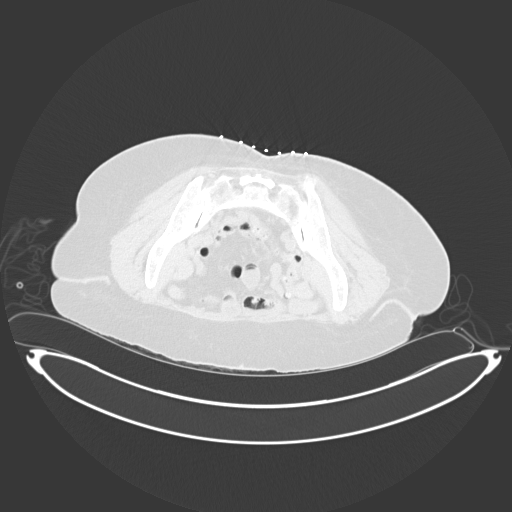
[im 15/39  lung]
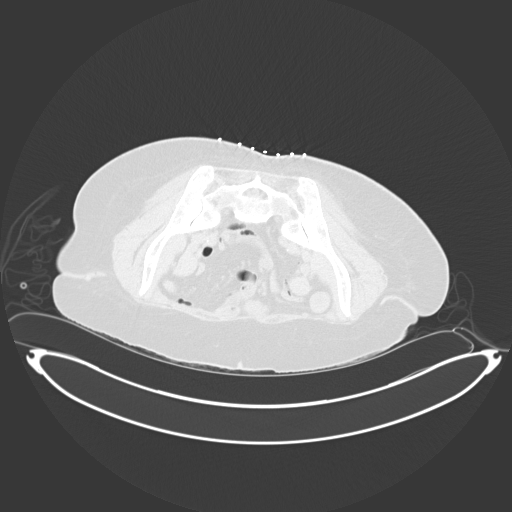
[im 17/39  lung]
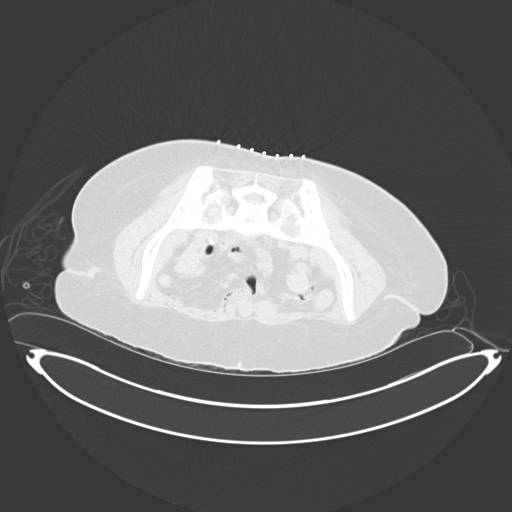
[im 19/39  lung]
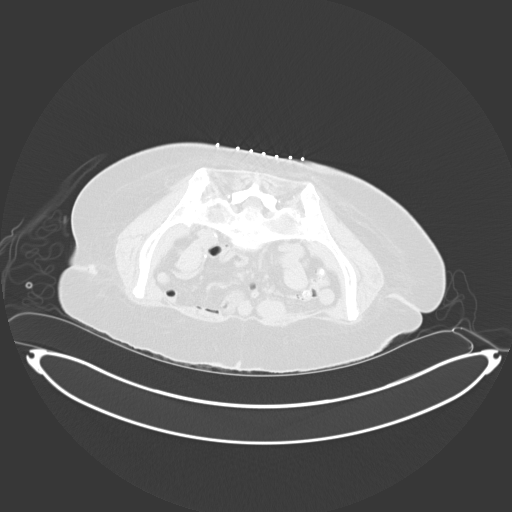
[im 22/39  mediastinal]
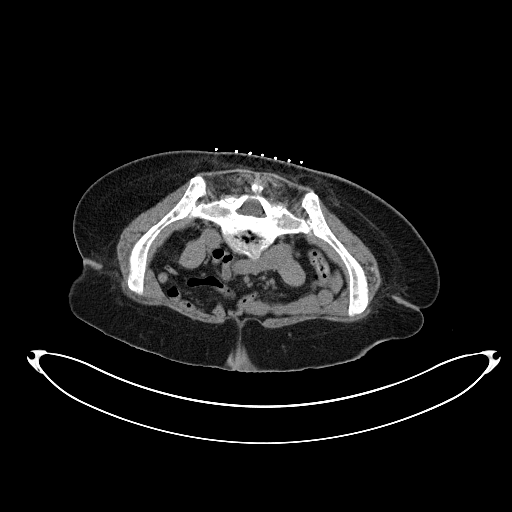
[im 22/39  lung]
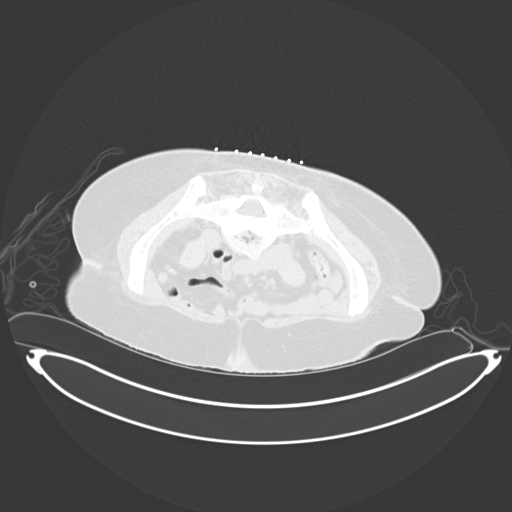
[im 24/39  lung]
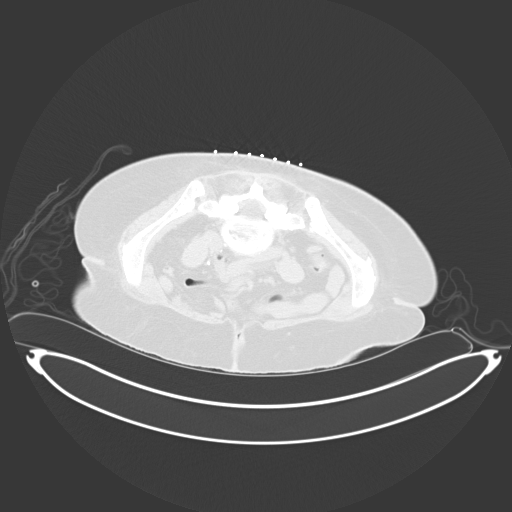
[im 27/39  lung]
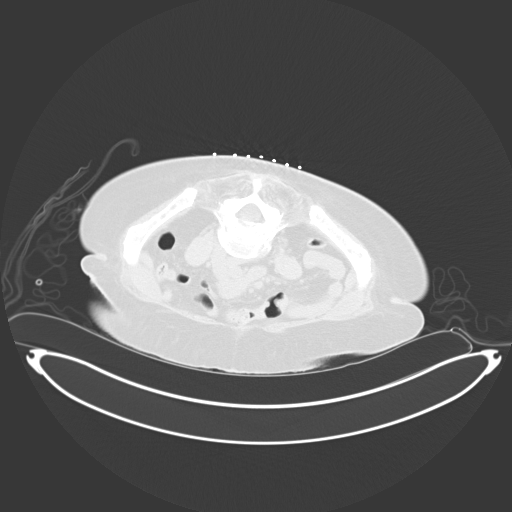
[im 29/39  lung]
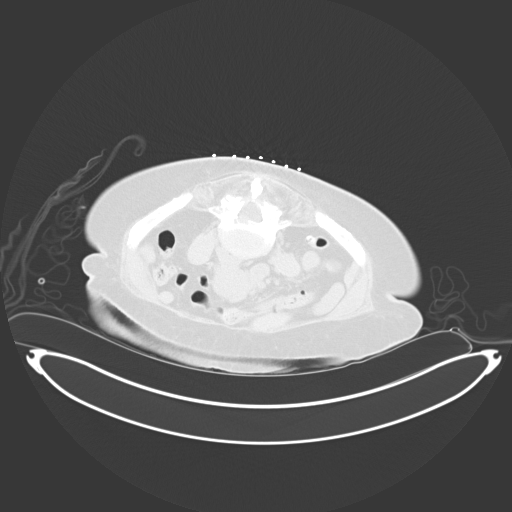
[im 32/39  mediastinal]
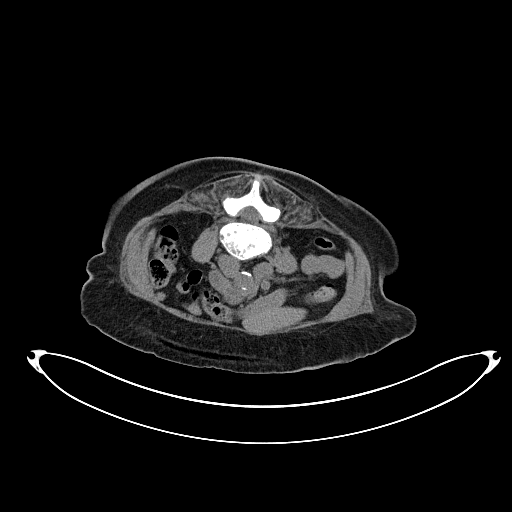
[im 32/39  lung]
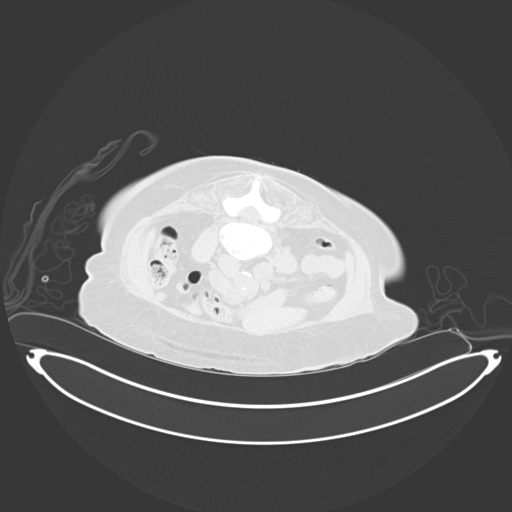
[im 34/39  lung]
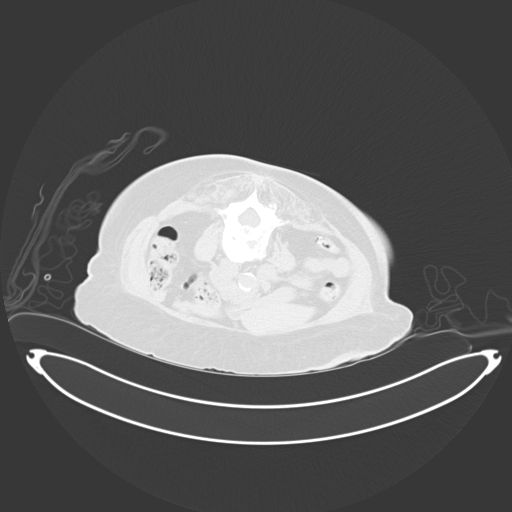
[im 37/39  lung]
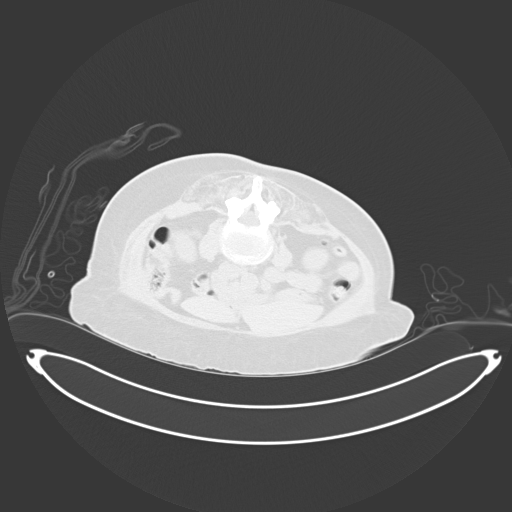

[15 of 30 positions shown; findings below may reference images not displayed]

EXAM:
CT GUIDED RIGHT ILIAC BONE MARROW ASPIRATION AND CORE BIOPSY

Radiologist:  EXCEL

Guidance:  CT

FLUOROSCOPY TIME:  Fluoroscopy Time: None.

MEDICATIONS:
1% lidocaine local

ANESTHESIA/SEDATION:
1.0 mg IV Versed; 50 mcg IV Fentanyl

Moderate Sedation Time:  10 minutes

The patient was continuously monitored during the procedure by the
interventional radiology nurse under my direct supervision.

CONTRAST:  None.

COMPLICATIONS:
None immediate

PROCEDURE:
Informed consent was obtained from the patient following explanation
of the procedure, risks, benefits and alternatives. The patient
understands, agrees and consents for the procedure. All questions
were addressed. A time out was performed.

The patient was positioned prone and non-contrast localization CT
was performed of the pelvis to demonstrate the iliac marrow spaces.

Maximal barrier sterile technique utilized including caps, mask,
sterile gowns, sterile gloves, large sterile drape, hand hygiene,
and Betadine prep.

Under sterile conditions and local anesthesia, an 11 gauge coaxial
bone biopsy needle was advanced into the right iliac marrow space.
Needle position was confirmed with CT imaging. Initially, bone
marrow aspiration was performed. Next, the 11 gauge outer cannula
was utilized to obtain a right iliac bone marrow core biopsy. Needle
was removed. Hemostasis was obtained with compression. The patient
tolerated the procedure well. Samples were prepared with the
cytotechnologist. No immediate complications.
IMPRESSION: CT guided right iliac bone marrow aspiration and core biopsy.

## 2020-09-08 MED ORDER — MIDAZOLAM HCL 2 MG/2ML IJ SOLN
INTRAMUSCULAR | Status: AC
  Filled 2020-09-08: qty 2

## 2020-09-08 MED ORDER — NALOXONE HCL 0.4 MG/ML IJ SOLN
INTRAMUSCULAR | Status: AC
  Filled 2020-09-08: qty 1

## 2020-09-08 MED ORDER — FENTANYL CITRATE (PF) 100 MCG/2ML IJ SOLN
INTRAMUSCULAR | Status: AC | PRN
  Administered 2020-09-08 (×2): 25 ug via INTRAVENOUS

## 2020-09-08 MED ORDER — FLUMAZENIL 0.5 MG/5ML IV SOLN
INTRAVENOUS | Status: AC
  Filled 2020-09-08: qty 5

## 2020-09-08 MED ORDER — MIDAZOLAM HCL 2 MG/2ML IJ SOLN
INTRAMUSCULAR | Status: AC | PRN
Start: 2020-09-08 — End: 2020-09-08
  Administered 2020-09-08 (×2): 0.5 mg via INTRAVENOUS

## 2020-09-08 MED ORDER — FENTANYL CITRATE (PF) 100 MCG/2ML IJ SOLN
INTRAMUSCULAR | Status: AC
  Filled 2020-09-08: qty 2

## 2020-09-08 MED ORDER — LIDOCAINE HCL (PF) 1 % IJ SOLN
INTRAMUSCULAR | Status: AC | PRN
  Administered 2020-09-08: 10 mL via INTRADERMAL

## 2020-09-08 MED ORDER — SODIUM CHLORIDE 0.9 % IV SOLN: INTRAVENOUS | Status: DC

## 2020-09-08 NOTE — Procedures (Signed)
Interventional Radiology Procedure Note  Procedure: CT BM ASP AND CORE    Complications: None  Estimated Blood Loss:  MIN  Findings: 11 G CORE AND ASP    M. TREVOR Tauheed Mcfayden, MD    

## 2020-09-08 NOTE — Telephone Encounter (Signed)
Spoke with pt's daughter, [name] regarding her mother's status for the pt's upcoming appointment with MD Lorenso Courier. Message sent to MD [facility] relaying pt's upcoming Prolia injections and pt's inability to complete the ordered 24-hour urine so far. Pt's daughter verbalizes that MD Lorenso Courier would follow-up with her at her next appointment on 09/15/20

## 2020-09-08 NOTE — Discharge Instructions (Signed)
Urgent needs - Interventional Radiology on call MD [phone]  Wound - May remove dressing and shower tomorrow.  Keep site clean and dry.  Replace with bandaid as needed.  Do not submerge in tub or water until site healing well. If closed with glue, glue will flake off on its own.   Bone Marrow Aspiration and Bone Marrow Biopsy, Adult, Care After This sheet gives you information about how to care for yourself after your procedure. Your health care provider may also give you more specific instructions. If you have problems or questions, contact your health care provider. What can I expect after the procedure? After the procedure, it is common to have:  Mild pain and tenderness.  Swelling.  Bruising. Follow these instructions at home: Puncture site care  Follow instructions from your health care provider about how to take care of the puncture site. Make sure you: ? Wash your hands with soap and water before and after you change your bandage (dressing). If soap and water are not available, use hand sanitizer. ? Change your dressing as told by your health care provider.  Check your puncture site every day for signs of infection. Check for: ? More redness, swelling, or pain. ? Fluid or blood. ? Warmth. ? Pus or a bad smell.   Activity  Return to your normal activities as told by your health care provider. Ask your health care provider what activities are safe for you.  Do not lift anything that is heavier than 10 lb (4.5 kg), or the limit that you are told, until your health care provider says that it is safe.  Do not drive for 24 hours if you were given a sedative during your procedure. General instructions  Take over-the-counter and prescription medicines only as told by your health care provider.  Do not take baths, swim, or use a hot tub until your health care provider approves. Ask your health care provider if you may take showers. You may only be allowed to take sponge  baths.  If directed, put ice on the affected area. To do this: ? Put ice in a plastic bag. ? Place a towel between your skin and the bag. ? Leave the ice on for 20 minutes, 2-3 times a day.  Keep all follow-up visits as told by your health care provider. This is important.   Contact a health care provider if:  Your pain is not controlled with medicine.  You have a fever.  You have more redness, swelling, or pain around the puncture site.  You have fluid or blood coming from the puncture site.  Your puncture site feels warm to the touch.  You have pus or a bad smell coming from the puncture site. Summary  After the procedure, it is common to have mild pain, tenderness, swelling, and bruising.  Follow instructions from your health care provider about how to take care of the puncture site and what activities are safe for you.  Take over-the-counter and prescription medicines only as told by your health care provider.  Contact a health care provider if you have any signs of infection, such as fluid or blood coming from the puncture site. This information is not intended to replace advice given to you by your health care provider. Make sure you discuss any questions you have with your health care provider. Document Revised: 10/24/2018 Document Reviewed: 10/24/2018 Elsevier Patient Education  2021 Elsevier Inc.   Moderate Conscious Sedation, Adult, Care After This sheet gives you   information about how to care for yourself after your procedure. Your health care provider may also give you more specific instructions. If you have problems or questions, contact your health care provider. What can I expect after the procedure? After the procedure, it is common to have:  Sleepiness for several hours.  Impaired judgment for several hours.  Difficulty with balance.  Vomiting if you eat too soon. Follow these instructions at home: For the time period you were told by your health care  provider:  Rest.  Do not participate in activities where you could fall or become injured.  Do not drive or use machinery.  Do not drink alcohol.  Do not take sleeping pills or medicines that cause drowsiness.  Do not make important decisions or sign legal documents.  Do not take care of children on your own.      Eating and drinking  Follow the diet recommended by your health care provider.  Drink enough fluid to keep your urine pale yellow.  If you vomit: ? Drink water, juice, or soup when you can drink without vomiting. ? Make sure you have little or no nausea before eating solid foods.   General instructions  Take over-the-counter and prescription medicines only as told by your health care provider.  Have a responsible adult stay with you for the time you are told. It is important to have someone help care for you until you are awake and alert.  Do not smoke.  Keep all follow-up visits as told by your health care provider. This is important. Contact a health care provider if:  You are still sleepy or having trouble with balance after 24 hours.  You feel light-headed.  You keep feeling nauseous or you keep vomiting.  You develop a rash.  You have a fever.  You have redness or swelling around the IV site. Get help right away if:  You have trouble breathing.  You have new-onset confusion at home. Summary  After the procedure, it is common to feel sleepy, have impaired judgment, or feel nauseous if you eat too soon.  Rest after you get home. Know the things you should not do after the procedure.  Follow the diet recommended by your health care provider and drink enough fluid to keep your urine pale yellow.  Get help right away if you have trouble breathing or new-onset confusion at home. This information is not intended to replace advice given to you by your health care provider. Make sure you discuss any questions you have with your health care  provider. Document Revised: 10/05/2019 Document Reviewed: 05/03/2019 Elsevier Patient Education  2021 Elsevier Inc.     

## 2020-09-08 NOTE — Telephone Encounter (Signed)
Returned pt's daughter's call as requested. No answer. VM left with [facility]'s phone number given and instructions to leave a detailed VM regarding questions/concerns.

## 2020-09-08 NOTE — Consult Note (Signed)
Chief Complaint: Patient was seen in consultation today for CT guided bone marrow biopsy   Referring Physician(s): Dorsey,J  Supervising Physician: Daryll Brod  [facility]  History of Present Illness: Madeline Burke is an 87 y.o. female with past medical history significant for diabetes, hypercholesterolemia, hypertension, asthma and now with elevated serum free light chains concerning for multiple myeloma who presents today for CT-guided bone marrow biopsy for further evaluation.  Past Medical History:  Diagnosis Date  . Diabetes mellitus without complication ([place])   . Elevated cholesterol   . History of angioedema 2006  . Hypertension     Past Surgical History:  Procedure Laterality Date  . ABDOMINAL HYSTERECTOMY  1997  . BREAST CYST EXCISION Right 1997  . VENOUS ABLATION  04/06/2013   right leg     Allergies: Aspirin, Bee venom, Famotidine, Fish allergy, Fish-derived products, Lisinopril, Other, Penicillamine, Penicillins, Shellfish allergy, Sulfa antibiotics, Sulfasalazine, and Flagyl [metronidazole]  Medications: Prior to Admission medications   Medication Sig Start Date End Date Taking? Authorizing Provider  amLODipine (NORVASC) 2.5 MG tablet Take 2.5 mg by mouth daily. 01/04/18  Yes [provider]  carvedilol (COREG) 25 MG tablet Take 25 mg by mouth 2 (two) times daily with a meal.  04/15/14  Yes [provider]  Cholecalciferol (VITAMIN D3) 2000 units TABS Take 2,000 Units by mouth daily.   Yes [provider]  cycloSPORINE (RESTASIS) 0.05 % ophthalmic emulsion Place 1 drop into both eyes 2 (two) times daily.    Yes [provider]  mirtazapine (REMERON) 15 MG tablet Take 15 mg by mouth at bedtime.   Yes [provider]  Multiple Vitamin (MULTIVITAMIN WITH MINERALS) TABS tablet Take 1 tablet by mouth daily. Centrum Silver   Yes [provider]  pioglitazone-metformin (ACTOPLUS MET)  15-850 MG per tablet Take 1 tablet by mouth daily.    Yes [provider]  rosuvastatin (CRESTOR) 10 MG tablet Take 10 mg by mouth daily.   Yes [provider]  triamcinolone cream (KENALOG) 0.1 % Apply 1 application topically 2 (two) times daily. 01/02/19  Yes Valentina Shaggy, MD  EPINEPHrine (EPIPEN 2-PAK) 0.3 mg/0.3 mL IJ SOAJ injection Inject 0.3 mLs into the muscle as needed for anaphylaxis.    [provider]  fexofenadine (ALLEGRA) 180 MG tablet Take 1 tablet (180 mg total) by mouth daily. 05/09/18 05/13/19  Valentina Shaggy, MD     Family History  Problem Relation Age of Onset  . Diabetes Other   . Hyperlipidemia Other   . Hypertension Other     Social History   Socioeconomic History  . Marital status: Divorced    Spouse name: Not on file  . Number of children: Not on file  . Years of education: Not on file  . Highest education level: Not on file  Occupational History  . Not on file  Tobacco Use  . Smoking status: Never Smoker  . Smokeless tobacco: Never Used  Substance and Sexual Activity  . Alcohol use: No  . Drug use: Not Currently  . Sexual activity: Yes    Birth control/protection: Surgical  Other Topics Concern  . Not on file  Social History Narrative  . Not on file   Social Determinants of Health   Financial Resource Strain: Not on file  Food Insecurity: Not on file  Transportation Needs: Not on file  Physical Activity: Not on file  Stress: Not on file  Social Connections: Not  on file     Review of Systems denies fever, headache, chest pain, dyspnea, abdominal/back pain, nausea, vomiting or bleeding.  She does have occasional cough.  Vital Signs: BP (!) 162/75   Pulse 62   Temp 98.2 F (36.8 C) (Oral)   Resp 17   SpO2 97%   Physical Exam awake, alert.  Chest clear to auscultation bilaterally.  Heart with regular rate and rhythm, positive murmur.  Abdomen soft, positive bowel sounds, nontender.  No lower  extremity edema.  Imaging: DG Bone Survey Met  Result Date: 09/04/2020 CLINICAL DATA:  Possible multiple myeloma. EXAM: METASTATIC BONE SURVEY COMPARISON:  August 01, 2020. FINDINGS: Multiple old compression fractures are noted in the thoracic and lumbar spine. Degenerative changes are seen involving both sacroiliac joints. Probable bilateral carotid artery calcifications are noted. No definite lytic or sclerotic lesions are noted. IMPRESSION: 1. Multiple old compression fractures are noted in the thoracic and lumbar spine. No definite lytic or sclerotic lesions are noted. 2. Probable bilateral carotid artery calcifications. Carotid ultrasound is recommended for further evaluation. Electronically Signed   By: Marijo Conception M.D.   On: 09/04/2020 20:07    Labs:  CBC: Recent Labs    06/20/20 2319 08/25/20 1518 09/08/20 0800  WBC 4.8 11.0* 5.5  HGB 11.2* 9.1* 8.8*  HCT 34.4* 28.4* 28.1*  PLT 217 134* 148*    COAGS: Recent Labs    09/08/20 0800  APTT 39*    BMP: Recent Labs    06/20/20 2319 08/25/20 1518  NA 141 144  K 3.8 4.1  CL 105 107  CO2 22 24  GLUCOSE 100* 97  BUN 12 23  CALCIUM 9.8 9.7  CREATININE 0.92 1.97*  GFRNONAA >60 25*    LIVER FUNCTION TESTS: Recent Labs    06/20/20 2319 08/25/20 1518  BILITOT 0.8 0.4  AST 45* 24  ALT 23 14  ALKPHOS 66 80  PROT 7.2 7.7  ALBUMIN 4.5 4.0    TUMOR MARKERS: No results for input(s): AFPTM, CEA, CA199, CHROMGRNA in the last 8760 hours.  Assessment and Plan: 87 y.o. female with past medical history significant for diabetes, hypercholesterolemia, hypertension, asthma and now with elevated serum free light chains concerning for multiple myeloma who presents today for CT-guided bone marrow biopsy for further evaluation.Risks and benefits of procedure was discussed with the patient  including, but not limited to bleeding, infection, damage to adjacent structures or low yield requiring additional tests.  All of the  questions were answered and there is agreement to proceed.  Consent signed and in chart.     Thank you for this interesting consult.  I greatly enjoyed meeting Madeline Burke and look forward to participating in their care.  A copy of this report was sent to the requesting provider on this date.  Electronically Signed: D. Rowe Robert, PA-C 09/08/2020, 8:38 AM   I spent a total of 20 minutes   in face to face in clinical consultation, greater than 50% of which was counseling/coordinating care for CT-guided bone marrow biopsy

## 2020-09-10 ENCOUNTER — Inpatient Hospital Stay (HOSPITAL_COMMUNITY)
Admission: EM | Admit: 2020-09-10 | Discharge: 2020-09-20 | DRG: 824 | Disposition: A | Discharge status: Hospice | Payer: Medicare Other | Attending: Internal Medicine | Admitting: Internal Medicine

## 2020-09-10 ENCOUNTER — Encounter (HOSPITAL_COMMUNITY): Payer: Self-pay

## 2020-09-10 ENCOUNTER — Other Ambulatory Visit: Payer: Self-pay

## 2020-09-10 ENCOUNTER — Emergency Department (HOSPITAL_COMMUNITY): Payer: Medicare Other

## 2020-09-10 DIAGNOSIS — R55 Syncope and collapse: Secondary | ICD-10-CM | POA: Diagnosis not present

## 2020-09-10 DIAGNOSIS — Z882 Allergy status to sulfonamides status: Secondary | ICD-10-CM

## 2020-09-10 DIAGNOSIS — J45909 Unspecified asthma, uncomplicated: Secondary | ICD-10-CM | POA: Diagnosis not present

## 2020-09-10 DIAGNOSIS — R768 Other specified abnormal immunological findings in serum: Secondary | ICD-10-CM | POA: Diagnosis not present

## 2020-09-10 DIAGNOSIS — R627 Adult failure to thrive: Secondary | ICD-10-CM | POA: Diagnosis present

## 2020-09-10 DIAGNOSIS — D49511 Neoplasm of unspecified behavior of right kidney: Secondary | ICD-10-CM | POA: Diagnosis not present

## 2020-09-10 DIAGNOSIS — Z833 Family history of diabetes mellitus: Secondary | ICD-10-CM

## 2020-09-10 DIAGNOSIS — T380X5A Adverse effect of glucocorticoids and synthetic analogues, initial encounter: Secondary | ICD-10-CM | POA: Diagnosis not present

## 2020-09-10 DIAGNOSIS — Z515 Encounter for palliative care: Secondary | ICD-10-CM

## 2020-09-10 DIAGNOSIS — D62 Acute posthemorrhagic anemia: Secondary | ICD-10-CM | POA: Diagnosis not present

## 2020-09-10 DIAGNOSIS — M7989 Other specified soft tissue disorders: Secondary | ICD-10-CM

## 2020-09-10 DIAGNOSIS — I714 Abdominal aortic aneurysm, without rupture: Secondary | ICD-10-CM | POA: Diagnosis present

## 2020-09-10 DIAGNOSIS — I10 Essential (primary) hypertension: Secondary | ICD-10-CM

## 2020-09-10 DIAGNOSIS — R1013 Epigastric pain: Secondary | ICD-10-CM | POA: Diagnosis not present

## 2020-09-10 DIAGNOSIS — Z881 Allergy status to other antibiotic agents status: Secondary | ICD-10-CM

## 2020-09-10 DIAGNOSIS — Z6822 Body mass index (BMI) 22.0-22.9, adult: Secondary | ICD-10-CM

## 2020-09-10 DIAGNOSIS — M79A12 Nontraumatic compartment syndrome of left upper extremity: Secondary | ICD-10-CM | POA: Diagnosis not present

## 2020-09-10 DIAGNOSIS — Z880 Allergy status to penicillin: Secondary | ICD-10-CM

## 2020-09-10 DIAGNOSIS — R109 Unspecified abdominal pain: Secondary | ICD-10-CM | POA: Diagnosis not present

## 2020-09-10 DIAGNOSIS — L7632 Postprocedural hematoma of skin and subcutaneous tissue following other procedure: Secondary | ICD-10-CM | POA: Diagnosis not present

## 2020-09-10 DIAGNOSIS — E7800 Pure hypercholesterolemia, unspecified: Secondary | ICD-10-CM | POA: Diagnosis not present

## 2020-09-10 DIAGNOSIS — Z79899 Other long term (current) drug therapy: Secondary | ICD-10-CM | POA: Diagnosis not present

## 2020-09-10 DIAGNOSIS — Z7984 Long term (current) use of oral hypoglycemic drugs: Secondary | ICD-10-CM

## 2020-09-10 DIAGNOSIS — Z8349 Family history of other endocrine, nutritional and metabolic diseases: Secondary | ICD-10-CM

## 2020-09-10 DIAGNOSIS — F0390 Unspecified dementia without behavioral disturbance: Secondary | ICD-10-CM | POA: Diagnosis present

## 2020-09-10 DIAGNOSIS — R197 Diarrhea, unspecified: Secondary | ICD-10-CM | POA: Diagnosis present

## 2020-09-10 DIAGNOSIS — E119 Type 2 diabetes mellitus without complications: Secondary | ICD-10-CM

## 2020-09-10 DIAGNOSIS — Z66 Do not resuscitate: Secondary | ICD-10-CM | POA: Diagnosis not present

## 2020-09-10 DIAGNOSIS — C9000 Multiple myeloma not having achieved remission: Principal | ICD-10-CM

## 2020-09-10 DIAGNOSIS — N179 Acute kidney failure, unspecified: Secondary | ICD-10-CM | POA: Diagnosis present

## 2020-09-10 DIAGNOSIS — D689 Coagulation defect, unspecified: Secondary | ICD-10-CM | POA: Diagnosis not present

## 2020-09-10 DIAGNOSIS — Z91030 Bee allergy status: Secondary | ICD-10-CM

## 2020-09-10 DIAGNOSIS — E785 Hyperlipidemia, unspecified: Secondary | ICD-10-CM | POA: Diagnosis not present

## 2020-09-10 DIAGNOSIS — N178 Other acute kidney failure: Secondary | ICD-10-CM

## 2020-09-10 DIAGNOSIS — D649 Anemia, unspecified: Secondary | ICD-10-CM | POA: Diagnosis not present

## 2020-09-10 DIAGNOSIS — D696 Thrombocytopenia, unspecified: Secondary | ICD-10-CM | POA: Diagnosis present

## 2020-09-10 DIAGNOSIS — E875 Hyperkalemia: Secondary | ICD-10-CM | POA: Diagnosis not present

## 2020-09-10 DIAGNOSIS — I959 Hypotension, unspecified: Secondary | ICD-10-CM | POA: Diagnosis not present

## 2020-09-10 DIAGNOSIS — E872 Acidosis: Secondary | ICD-10-CM | POA: Diagnosis not present

## 2020-09-10 DIAGNOSIS — Z8249 Family history of ischemic heart disease and other diseases of the circulatory system: Secondary | ICD-10-CM

## 2020-09-10 DIAGNOSIS — R0602 Shortness of breath: Secondary | ICD-10-CM

## 2020-09-10 DIAGNOSIS — E1165 Type 2 diabetes mellitus with hyperglycemia: Secondary | ICD-10-CM | POA: Diagnosis not present

## 2020-09-10 DIAGNOSIS — D630 Anemia in neoplastic disease: Secondary | ICD-10-CM | POA: Diagnosis present

## 2020-09-10 DIAGNOSIS — Y848 Other medical procedures as the cause of abnormal reaction of the patient, or of later complication, without mention of misadventure at the time of the procedure: Secondary | ICD-10-CM | POA: Diagnosis not present

## 2020-09-10 DIAGNOSIS — I2699 Other pulmonary embolism without acute cor pulmonale: Secondary | ICD-10-CM

## 2020-09-10 DIAGNOSIS — I700 Atherosclerosis of aorta: Secondary | ICD-10-CM | POA: Diagnosis not present

## 2020-09-10 DIAGNOSIS — Z888 Allergy status to other drugs, medicaments and biological substances status: Secondary | ICD-10-CM

## 2020-09-10 DIAGNOSIS — Z20822 Contact with and (suspected) exposure to covid-19: Secondary | ICD-10-CM | POA: Diagnosis present

## 2020-09-10 DIAGNOSIS — Z886 Allergy status to analgesic agent status: Secondary | ICD-10-CM

## 2020-09-10 LAB — CBC WITH DIFFERENTIAL/PLATELET
Abs Immature Granulocytes: 0.01 10*3/uL (ref 0.00–0.07)
Basophils Absolute: 0 10*3/uL (ref 0.0–0.1)
Basophils Relative: 0 %
Eosinophils Absolute: 0.1 10*3/uL (ref 0.0–0.5)
Eosinophils Relative: 1 %
HCT: 26.7 % — ABNORMAL LOW (ref 36.0–46.0)
Hemoglobin: 8.6 g/dL — ABNORMAL LOW (ref 12.0–15.0)
Immature Granulocytes: 0 %
Lymphocytes Relative: 50 %
Lymphs Abs: 3.5 10*3/uL (ref 0.7–4.0)
MCH: 29 pg (ref 26.0–34.0)
MCHC: 32.2 g/dL (ref 30.0–36.0)
MCV: 89.9 fL (ref 80.0–100.0)
Monocytes Absolute: 1.7 10*3/uL — ABNORMAL HIGH (ref 0.1–1.0)
Monocytes Relative: 24 %
Neutro Abs: 1.8 10*3/uL (ref 1.7–7.7)
Neutrophils Relative %: 25 %
Platelets: 139 10*3/uL — ABNORMAL LOW (ref 150–400)
RBC: 2.97 MIL/uL — ABNORMAL LOW (ref 3.87–5.11)
RDW: 18.1 % — ABNORMAL HIGH (ref 11.5–15.5)
WBC: 7.1 10*3/uL (ref 4.0–10.5)
nRBC: 0 % (ref 0.0–0.2)

## 2020-09-10 LAB — URINALYSIS, ROUTINE W REFLEX MICROSCOPIC
Bilirubin Urine: NEGATIVE
Glucose, UA: NEGATIVE mg/dL
Hgb urine dipstick: NEGATIVE
Ketones, ur: NEGATIVE mg/dL
Leukocytes,Ua: NEGATIVE
Nitrite: NEGATIVE
Protein, ur: 100 mg/dL — AB
Specific Gravity, Urine: 1.012 (ref 1.005–1.030)
pH: 6 (ref 5.0–8.0)

## 2020-09-10 LAB — COMPREHENSIVE METABOLIC PANEL
ALT: 13 U/L (ref 0–44)
AST: 24 U/L (ref 15–41)
Albumin: 4.3 g/dL (ref 3.5–5.0)
Alkaline Phosphatase: 58 U/L (ref 38–126)
Anion gap: 12 (ref 5–15)
BUN: 25 mg/dL — ABNORMAL HIGH (ref 8–23)
CO2: 21 mmol/L — ABNORMAL LOW (ref 22–32)
Calcium: 9.6 mg/dL (ref 8.9–10.3)
Chloride: 111 mmol/L (ref 98–111)
Creatinine, Ser: 2.57 mg/dL — ABNORMAL HIGH (ref 0.44–1.00)
GFR, Estimated: 18 mL/min — ABNORMAL LOW (ref >60)
Glucose, Bld: 109 mg/dL — ABNORMAL HIGH (ref 70–99)
Potassium: 4.6 mmol/L (ref 3.5–5.1)
Sodium: 144 mmol/L (ref 135–145)
Total Bilirubin: 0.3 mg/dL (ref 0.3–1.2)
Total Protein: 7.4 g/dL (ref 6.5–8.1)

## 2020-09-10 LAB — TROPONIN I (HIGH SENSITIVITY): Troponin I (High Sensitivity): 17 ng/L (ref <18)

## 2020-09-10 LAB — SURGICAL PATHOLOGY

## 2020-09-10 LAB — D-DIMER, QUANTITATIVE: D-Dimer, Quant: 1.65 ug/mL-FEU — ABNORMAL HIGH (ref 0.00–0.50)

## 2020-09-10 LAB — LIPASE, BLOOD: Lipase: 51 U/L (ref 11–51)

## 2020-09-10 IMAGING — US US AORTA
1 series · 14 of 20 positions shown · non-contrast
Comparison: CT abdomen pelvis dated [DATE].

CLINICAL DATA: 87-year-old female with concern for aortic Disease.

EXAM:
ULTRASOUND OF ABDOMINAL AORTA
TECHNIQUE: Ultrasound examination of the abdominal aorta and proximal common
iliac arteries was performed to evaluate for aneurysm. Additional
color and Doppler images of the distal aorta were obtained to
document patency.

[Series 1: us aorta · 14 of 20 slices shown]
[im 1/20]
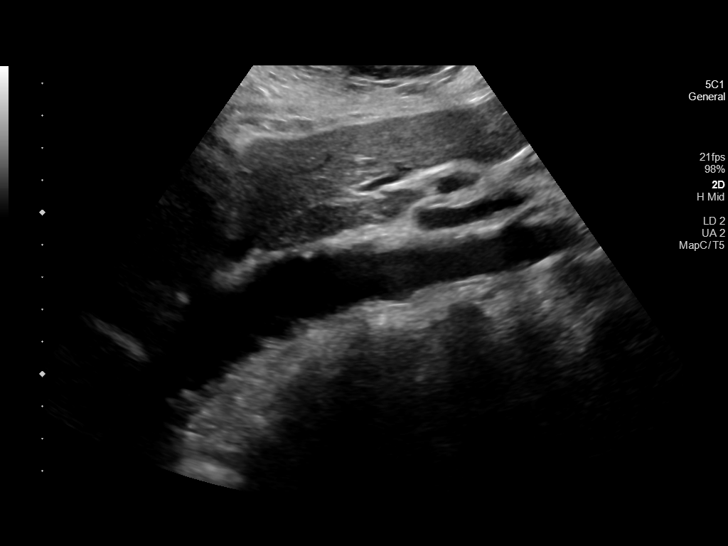
[im 3/20]
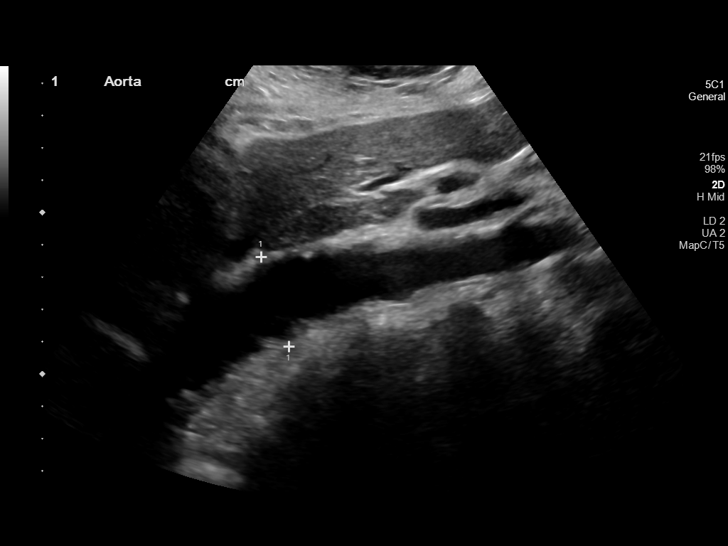
[im 4/20]
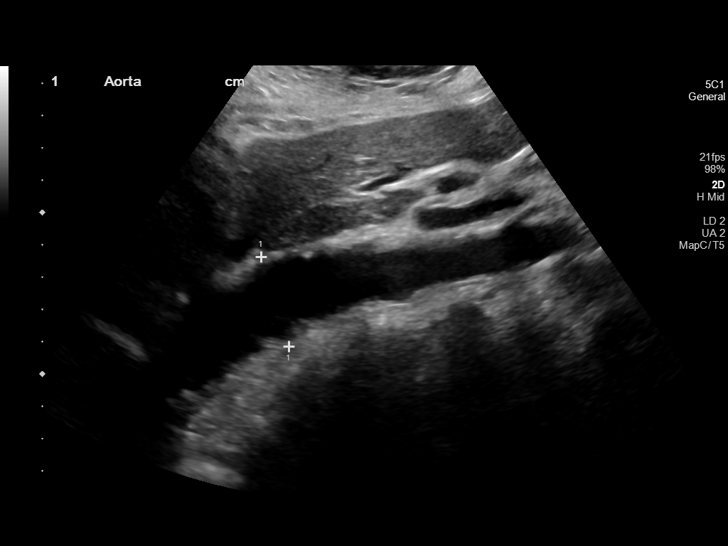
[im 6/20]
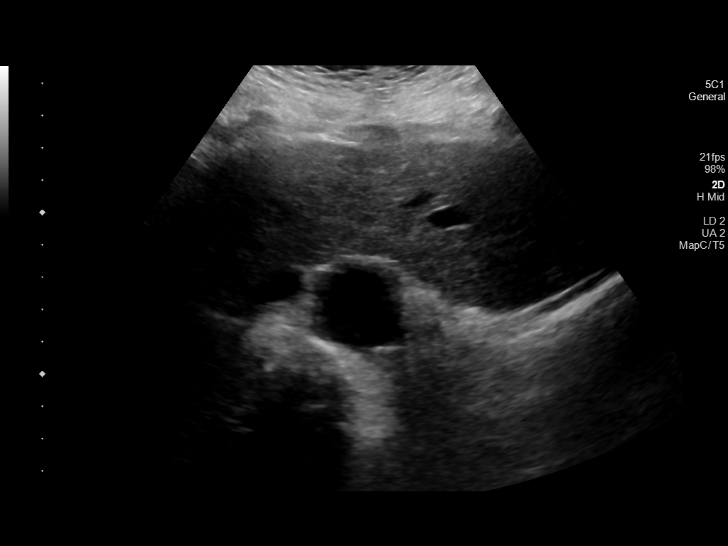
[im 7/20]
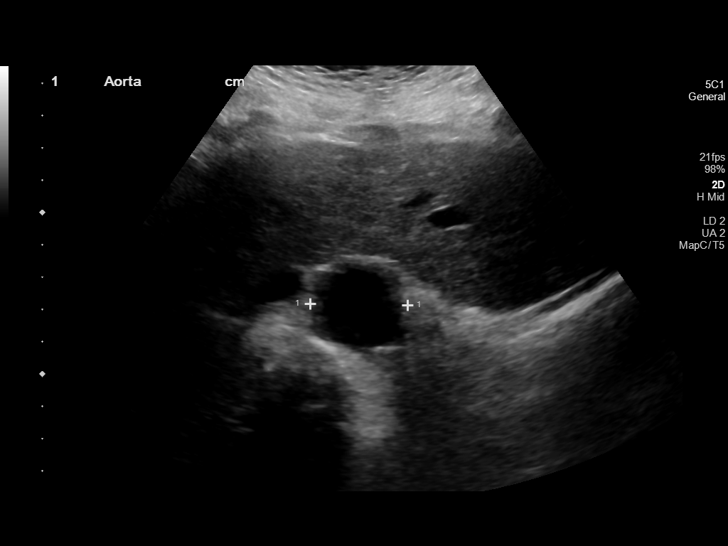
[im 8/20]
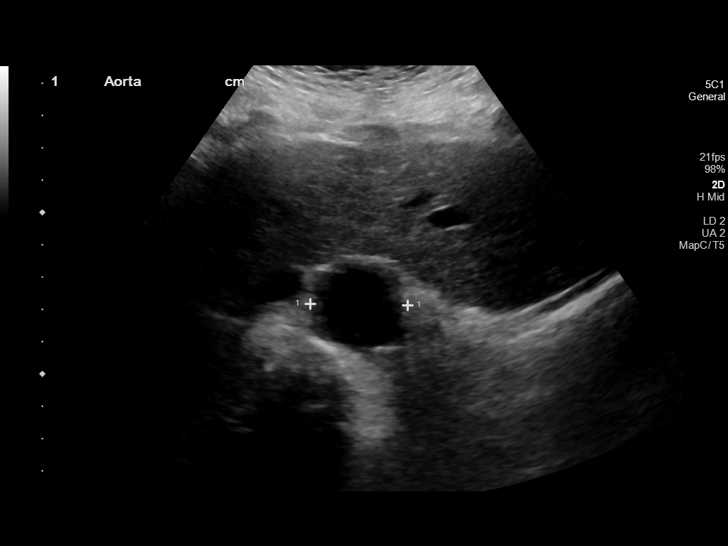
[im 10/20]
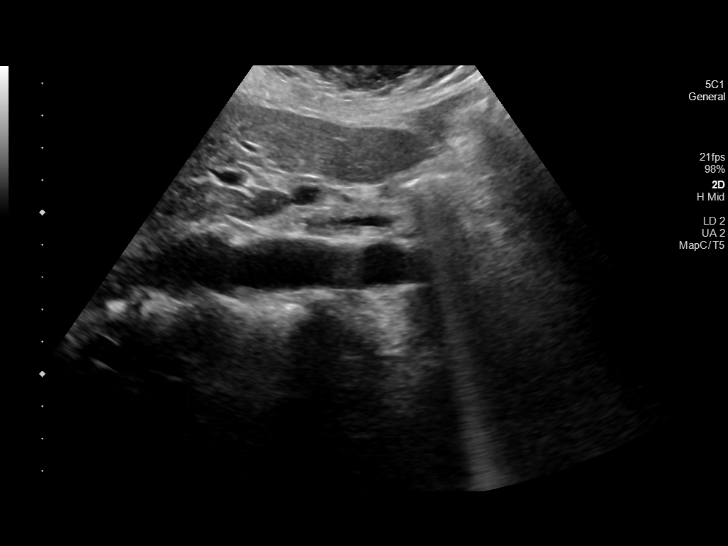
[im 11/20]
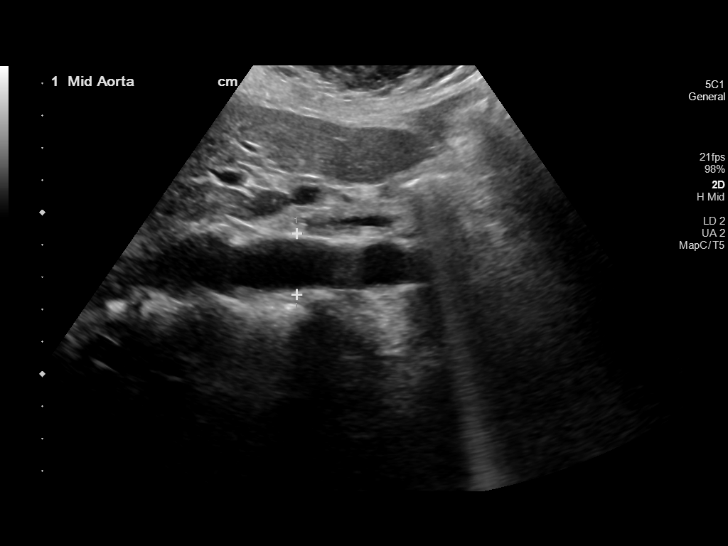
[im 13/20]
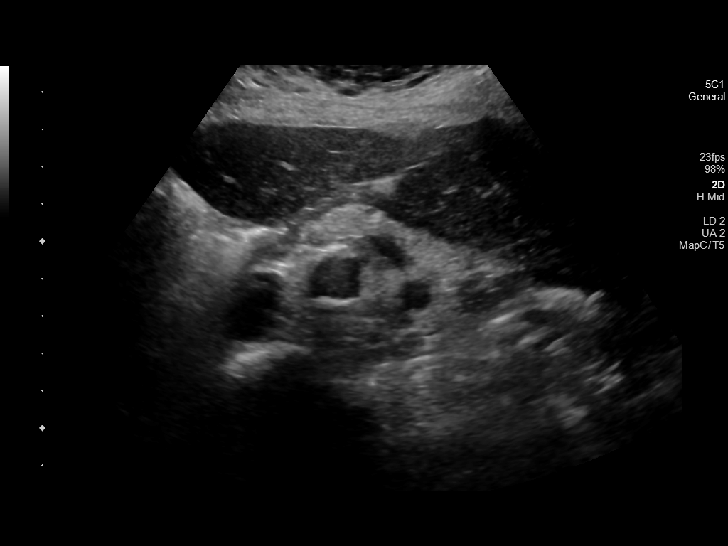
[im 14/20]
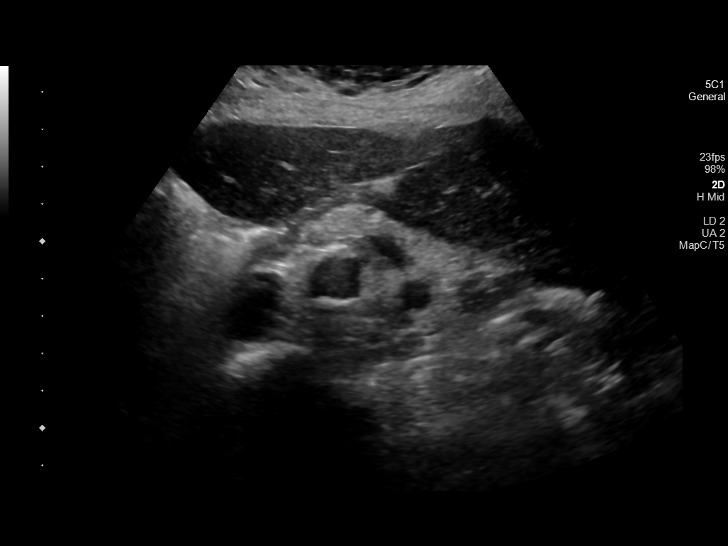
[im 16/20]
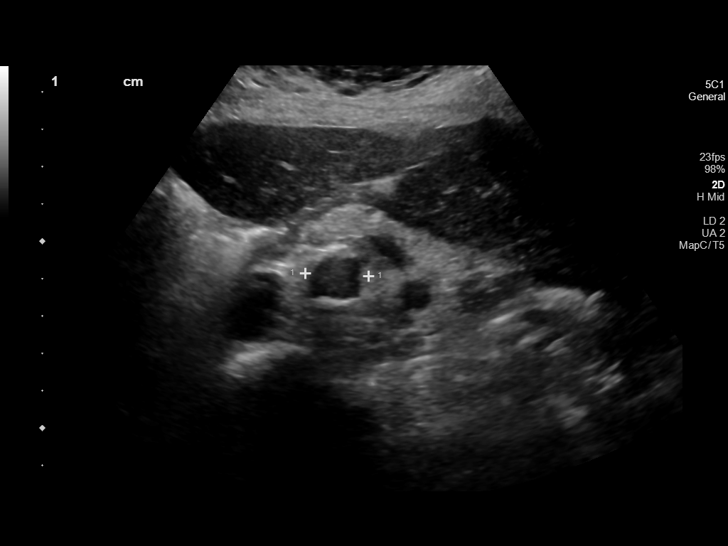
[im 17/20]
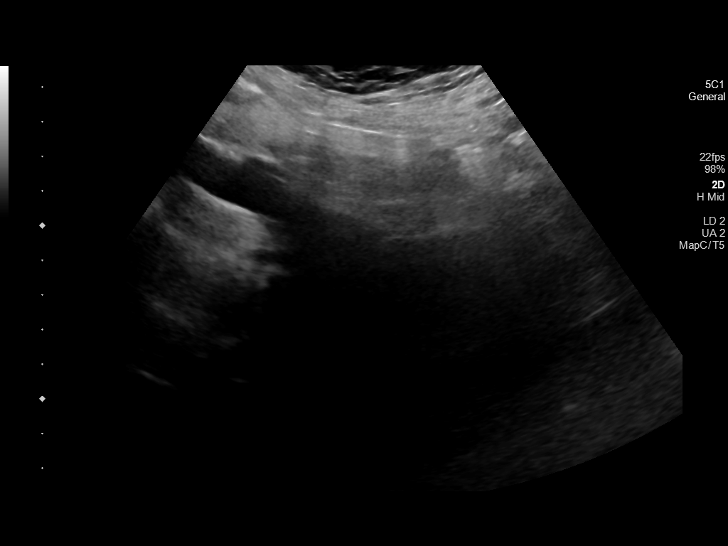
[im 18/20]
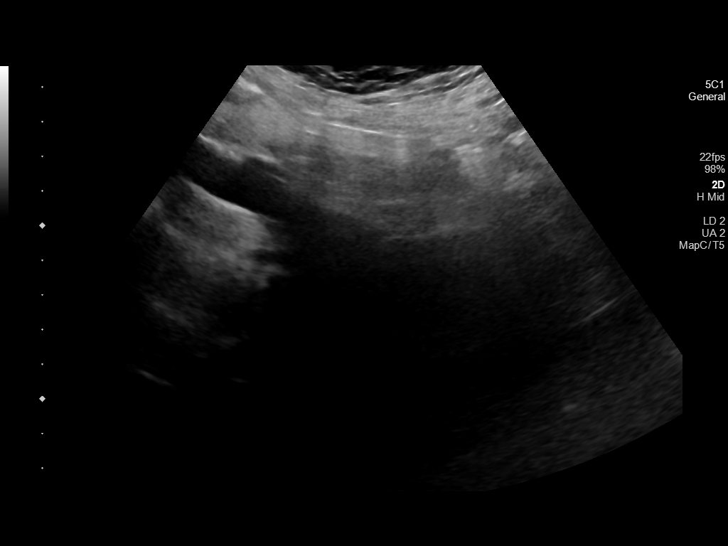
[im 20/20]
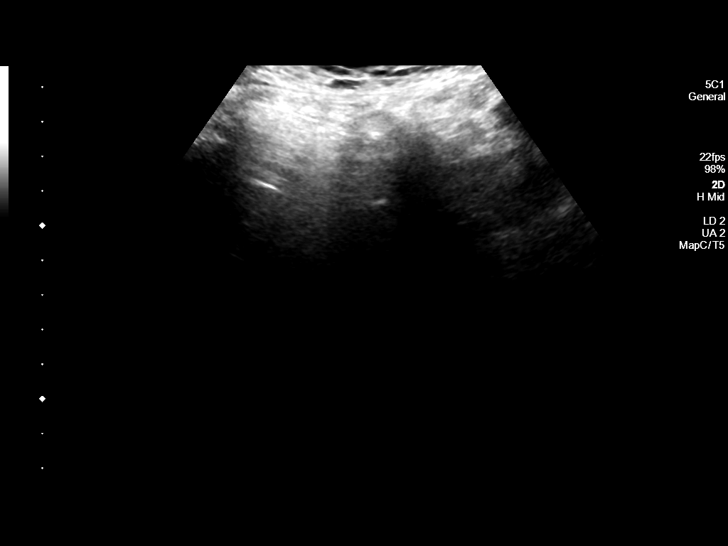

[14 of 20 positions shown; findings below may reference images not displayed]

FINDINGS: Evaluation is limited due to overlying bowel gas and inability of
patient to cooperate with exam.

There is atherosclerotic calcification of the visualized abdominal
aorta.

Abdominal aortic measurements as follows:

Proximal:  2.9 x 3.0 cm

Mid:  1.9 x 1.7 cm

Distal: Not visualized due to bowel gas.
Patent: Not evaluated.

Right common iliac artery: Not visualized due to bowel gas.

Left common iliac artery: Not visualized due to bowel gas.
IMPRESSION: 1. Abdominal aortic aneurysm measuring 3 cm proximally. Recommend
follow-up ultrasound every 3 years. This recommendation follows ACR
consensus guidelines: White Paper of the ACR Incidental Findings
Committee II on Vascular Findings. [HOSPITAL] [DF];
2.  Aortic Atherosclerosis ([DF]-[DF]).

## 2020-09-10 MED ORDER — SODIUM CHLORIDE 0.9 % IV BOLUS
500.0000 mL | Freq: Once | INTRAVENOUS | Status: AC
  Administered 2020-09-10: 500 mL via INTRAVENOUS

## 2020-09-10 MED ORDER — ONDANSETRON HCL 4 MG PO TABS: 4.0000 mg | ORAL_TABLET | Freq: Four times a day (QID) | ORAL | Status: DC | PRN

## 2020-09-10 MED ORDER — SODIUM CHLORIDE 0.9% FLUSH
3.0000 mL | Freq: Two times a day (BID) | INTRAVENOUS | Status: DC
  Administered 2020-09-11 – 2020-09-20 (×11): 3 mL via INTRAVENOUS

## 2020-09-10 MED ORDER — EPINEPHRINE 0.3 MG/0.3ML IJ SOAJ
0.3000 mg | INTRAMUSCULAR | Status: DC | PRN
  Filled 2020-09-10: qty 0.6

## 2020-09-10 MED ORDER — PANTOPRAZOLE SODIUM 40 MG PO TBEC
40.0000 mg | DELAYED_RELEASE_TABLET | Freq: Every day | ORAL | Status: DC
  Administered 2020-09-11 – 2020-09-20 (×8): 40 mg via ORAL
  Filled 2020-09-10 (×11): qty 1

## 2020-09-10 MED ORDER — ENOXAPARIN SODIUM 30 MG/0.3ML ~~LOC~~ SOLN
30.0000 mg | SUBCUTANEOUS | Status: DC
  Administered 2020-09-11 – 2020-09-14 (×4): 30 mg via SUBCUTANEOUS
  Filled 2020-09-10 (×4): qty 0.3

## 2020-09-10 MED ORDER — FENTANYL CITRATE (PF) 100 MCG/2ML IJ SOLN
25.0000 ug | Freq: Once | INTRAMUSCULAR | Status: AC
  Administered 2020-09-10: 25 ug via INTRAVENOUS
  Filled 2020-09-10: qty 2

## 2020-09-10 MED ORDER — CYCLOSPORINE 0.05 % OP EMUL
1.0000 [drp] | Freq: Two times a day (BID) | OPHTHALMIC | Status: DC
  Administered 2020-09-11 – 2020-09-18 (×14): 1 [drp] via OPHTHALMIC
  Filled 2020-09-10 (×20): qty 1

## 2020-09-10 MED ORDER — POLYETHYLENE GLYCOL 3350 17 G PO PACK: 17.0000 g | PACK | Freq: Every day | ORAL | Status: DC | PRN

## 2020-09-10 MED ORDER — ACETAMINOPHEN 650 MG RE SUPP: 650.0000 mg | Freq: Four times a day (QID) | RECTAL | Status: DC | PRN

## 2020-09-10 MED ORDER — AMLODIPINE BESYLATE 5 MG PO TABS
2.5000 mg | ORAL_TABLET | Freq: Every day | ORAL | Status: DC
  Administered 2020-09-11 – 2020-09-15 (×5): 2.5 mg via ORAL
  Filled 2020-09-10 (×6): qty 1

## 2020-09-10 MED ORDER — INSULIN ASPART 100 UNIT/ML ~~LOC~~ SOLN
0.0000 [IU] | Freq: Three times a day (TID) | SUBCUTANEOUS | Status: DC
  Administered 2020-09-12 – 2020-09-13 (×3): 2 [IU] via SUBCUTANEOUS
  Administered 2020-09-13 – 2020-09-14 (×4): 1 [IU] via SUBCUTANEOUS
  Administered 2020-09-15 – 2020-09-16 (×2): 2 [IU] via SUBCUTANEOUS
  Administered 2020-09-16: 1 [IU] via SUBCUTANEOUS
  Administered 2020-09-16 – 2020-09-17 (×2): 2 [IU] via SUBCUTANEOUS
  Administered 2020-09-18: 3 [IU] via SUBCUTANEOUS
  Filled 2020-09-10: qty 0.09

## 2020-09-10 MED ORDER — ACETAMINOPHEN 325 MG PO TABS
650.0000 mg | ORAL_TABLET | Freq: Four times a day (QID) | ORAL | Status: DC | PRN
  Administered 2020-09-12 – 2020-09-17 (×9): 650 mg via ORAL
  Filled 2020-09-10 (×11): qty 2

## 2020-09-10 MED ORDER — CARVEDILOL 25 MG PO TABS
25.0000 mg | ORAL_TABLET | Freq: Two times a day (BID) | ORAL | Status: DC
  Administered 2020-09-11 – 2020-09-13 (×6): 25 mg via ORAL
  Filled 2020-09-10 (×6): qty 1

## 2020-09-10 MED ORDER — ONDANSETRON HCL 4 MG/2ML IJ SOLN
4.0000 mg | Freq: Four times a day (QID) | INTRAMUSCULAR | Status: DC | PRN
  Administered 2020-09-14 (×2): 4 mg via INTRAVENOUS
  Filled 2020-09-10 (×2): qty 2

## 2020-09-10 MED ORDER — SODIUM CHLORIDE 0.9 % IV BOLUS
1000.0000 mL | Freq: Once | INTRAVENOUS | Status: AC
  Administered 2020-09-10: 1000 mL via INTRAVENOUS

## 2020-09-10 MED ORDER — ROSUVASTATIN CALCIUM 5 MG PO TABS
10.0000 mg | ORAL_TABLET | Freq: Every day | ORAL | Status: DC
  Administered 2020-09-11 – 2020-09-17 (×7): 10 mg via ORAL
  Filled 2020-09-10: qty 2
  Filled 2020-09-10 (×2): qty 1
  Filled 2020-09-10: qty 2
  Filled 2020-09-10 (×3): qty 1

## 2020-09-10 MED ORDER — ONDANSETRON HCL 4 MG/2ML IJ SOLN
4.0000 mg | Freq: Once | INTRAMUSCULAR | Status: AC
  Administered 2020-09-10: 4 mg via INTRAVENOUS
  Filled 2020-09-10: qty 2

## 2020-09-10 NOTE — H&P (Signed)
History and Physical   Madeline Burke WOE:[id number] DOB: 04-May-1934 DOA: 09/10/2020  PCP: Leeroy Cha, MD   Patient coming from: Home  Chief Complaint: Abdominal pain  HPI: Madeline Burke is a 87 y.o. female with medical history significant of hypertension, hyperlipidemia, diabetes, angioedema, currently being worked up for elevated immunoglobulin levels who presents with abdominal pain.  Patient has had some intermittent abdominal pain for months, first starting in January or February. There was a CT scan in February which did not show exhalation for the pain.  She was noted to have the pain during her oncology visit in March and it was believed not to be related to her elevated immunoglobulin levels and her work-up from their perspective.  She is continued to have waxing and waning abdominal pain.  The pain is difficult to describe but is epigastric per patient.  She states that it does seem to improve with food and her daughter states that it does seem to improve whenever she is taken Pepto-Bismol for diarrhea.  They have not noticed any other aggravating or alleviating factors.  For the past couple days, especially today, she has had significant episodes of pain.  She had had some at home that were witnessed by her other daughters (who are not present).  Where it appeared that she passed out.  Patient does not remember exactly what happened but knew that she felt unwell.    As above she has had some intermittent diarrhea consisting of multiple loose stools a day but this appears to come and go. She denies fevers, chills, chest pain, nausea, vomiting, constipation.   ED Course: Vital signs in the ED significant for blood pressure in the 003B to 048G systolic.  Heart rate in the 50s to 70s.  Lab work-up showed CMP with BUN 25, creatinine 2.5 up from baseline of 1.  Hemoglobin now 8.6 which is stable from the last few days of a week but down from 11 2 months ago.  Lipase normal,  troponin normal, D-dimer elevated at 1.65, respiratory panel for flu and Covid pending, urinalysis pending.  Aorta ultrasound showed a 3 cm AAA with recommendation for 3-year follow-up.  VQ scan has been ordered.  Patient received 1.5 L IV fluids, fentanyl, Zofran in ED with improvement of her symptoms.  Review of Systems: As per HPI otherwise all other systems reviewed and are negative.  Past Medical History:  Diagnosis Date  . Diabetes mellitus without complication ([place])   . Elevated cholesterol   . History of angioedema 2006  . Hypertension     Past Surgical History:  Procedure Laterality Date  . ABDOMINAL HYSTERECTOMY  1997  . BREAST CYST EXCISION Right 1997  . VENOUS ABLATION  04/06/2013   right leg     Social History  reports that she has never smoked. She has never used smokeless tobacco. She reports previous drug use. She reports that she does not drink alcohol.  Allergies  Allergen Reactions  . Aspirin Hives  . Bee Venom Hives  . Famotidine Other (See Comments)    "mouth irritated" Other reaction(s): Other (See Comments) "mouth irritated"  . Fish Allergy Hives  . Fish-Derived Products Other (See Comments)  . Lisinopril Cough    Other reaction(s): Cough (ALLERGY/intolerance)  . Other Hives  . Penicillamine Hives  . Penicillins Hives  . Shellfish Allergy Hives  . Sulfa Antibiotics Hives  . Sulfasalazine Hives  . Flagyl [Metronidazole] Rash    Family History  Problem Relation Age of  Onset  . Diabetes Other   . Hyperlipidemia Other   . Hypertension Other   Reviewed on admission  Prior to Admission medications   Medication Sig Start Date End Date Taking? Authorizing Provider  carvedilol (COREG) 25 MG tablet Take 25 mg by mouth 2 (two) times daily with a meal.  04/15/14  Yes [provider]  albuterol (VENTOLIN HFA) 108 (90 Base) MCG/ACT inhaler Inhale 2 puffs into the lungs every 6 (six) hours.    [provider]  amLODipine (NORVASC) 2.5  MG tablet Take 2.5 mg by mouth daily. 01/04/18   [provider]  Cholecalciferol (VITAMIN D3) 2000 units TABS Take 2,000 Units by mouth daily.    [provider]  cycloSPORINE (RESTASIS) 0.05 % ophthalmic emulsion Place 1 drop into both eyes 2 (two) times daily.     [provider]  EPINEPHrine (EPIPEN 2-PAK) 0.3 mg/0.3 mL IJ SOAJ injection Inject 0.3 mLs into the muscle as needed for anaphylaxis.    [provider]  fexofenadine (ALLEGRA) 180 MG tablet Take 1 tablet (180 mg total) by mouth daily. 05/09/18 05/13/19  Valentina Shaggy, MD  mirtazapine (REMERON) 15 MG tablet Take 15 mg by mouth at bedtime.    [provider]  Multiple Vitamin (MULTIVITAMIN WITH MINERALS) TABS tablet Take 1 tablet by mouth daily. Centrum Silver    [provider]  pioglitazone-metformin (ACTOPLUS MET) 15-850 MG per tablet Take 1 tablet by mouth daily.     [provider]  rosuvastatin (CRESTOR) 10 MG tablet Take 10 mg by mouth daily.    [provider]  triamcinolone cream (KENALOG) 0.1 % Apply 1 application topically 2 (two) times daily. 01/02/19   Valentina Shaggy, MD    Physical Exam: Vitals:   09/10/20 2130 09/10/20 2200 09/10/20 2230 09/10/20 2300  BP: (!) 156/62 (!) 155/55 (!) 151/58 (!) 162/58  Pulse: 60 (!) 53 (!) 52 (!) 56  Resp: 18 17 16 18   Temp:      TempSrc:      SpO2: 99% 99% 99% 97%  Weight:      Height:       Physical Exam Constitutional:      General: She is not in acute distress.    Appearance: Normal appearance.  HENT:     Head: Normocephalic and atraumatic.     Mouth/Throat:     Mouth: Mucous membranes are moist.     Pharynx: Oropharynx is clear.  Eyes:     Extraocular Movements: Extraocular movements intact.     Pupils: Pupils are equal, round, and reactive to light.  Cardiovascular:     Rate and Rhythm: Normal rate and regular rhythm.     Pulses: Normal pulses.     Heart sounds: Murmur heard.     Pulmonary:     Effort: Pulmonary effort is normal. No respiratory distress.     Breath sounds: Normal breath sounds.  Abdominal:     General: Bowel sounds are normal. There is no distension.     Palpations: Abdomen is soft.     Tenderness: There is abdominal tenderness in the epigastric area.  Musculoskeletal:        General: No swelling or deformity.  Skin:    General: Skin is warm and dry.  Neurological:     General: No focal deficit present.     Mental Status: Mental status is at baseline.    Labs on Admission: I have personally reviewed following labs and imaging studies  CBC: Recent Labs  Lab 09/08/20 0800 09/10/20 1910  WBC 5.5 7.1  NEUTROABS 1.3* 1.8  HGB 8.8* 8.6*  HCT 28.1* 26.7*  MCV 89.8 89.9  PLT 148* 139*    Basic Metabolic Panel: Recent Labs  Lab 09/10/20 1910  NA 144  K 4.6  CL 111  CO2 21*  GLUCOSE 109*  BUN 25*  CREATININE 2.57*  CALCIUM 9.6    GFR: Estimated Creatinine Clearance: 12.7 mL/min (A) (by C-G formula based on SCr of 2.57 mg/dL (H)).  Liver Function Tests: Recent Labs  Lab 09/10/20 1910  AST 24  ALT 13  ALKPHOS 58  BILITOT 0.3  PROT 7.4  ALBUMIN 4.3    Urine analysis:    Component Value Date/Time   COLORURINE STRAW (A) 09/10/2020 2230   APPEARANCEUR CLEAR 09/10/2020 2230   LABSPEC 1.012 09/10/2020 2230   PHURINE 6.0 09/10/2020 2230   GLUCOSEU NEGATIVE 09/10/2020 2230   HGBUR NEGATIVE 09/10/2020 2230   BILIRUBINUR NEGATIVE 09/10/2020 2230   KETONESUR NEGATIVE 09/10/2020 2230   PROTEINUR 100 (A) 09/10/2020 2230   UROBILINOGEN 0.2 09/25/2007 1130   NITRITE NEGATIVE 09/10/2020 2230   LEUKOCYTESUR NEGATIVE 09/10/2020 2230    Radiological Exams on Admission: US Aorta  Result Date: 09/10/2020 CLINICAL DATA:  87 year old female with concern for aortic Disease. EXAM: ULTRASOUND OF ABDOMINAL AORTA TECHNIQUE: Ultrasound examination of the abdominal aorta and proximal common iliac arteries was performed to evaluate  for aneurysm. Additional color and Doppler images of the distal aorta were obtained to document patency. COMPARISON:  CT abdomen pelvis dated 08/01/2020. FINDINGS: Evaluation is limited due to overlying bowel gas and inability of patient to cooperate with exam. There is atherosclerotic calcification of the visualized abdominal aorta. Abdominal aortic measurements as follows: Proximal:  2.9 x 3.0 cm Mid:  1.9 x 1.7 cm Distal: Not visualized due to bowel gas. Patent: Not evaluated. Right common iliac artery: Not visualized due to bowel gas. Left common iliac artery: Not visualized due to bowel gas. IMPRESSION: 1. Abdominal aortic aneurysm measuring 3 cm proximally. Recommend follow-up ultrasound every 3 years. This recommendation follows ACR consensus guidelines: White Paper of the ACR Incidental Findings Committee II on Vascular Findings. J Am Coll Radiol 2013; 10:789-794. 2.  Aortic Atherosclerosis (ICD10-I70.0). Electronically Signed   By: Anner Crete M.D.   On: 09/10/2020 21:45    EKG: Independently reviewed.  Sinus rhythm at 57 bpm.  LVH.  QTc 411.  PAC noted.  Assessment/Plan Principal Problem:   AKI (acute kidney injury) ([place]) Active Problems:   Abdominal pain, epigastric   HTN (hypertension)   Diabetes ([facility])   Anemia  AKI > Creatinine elevated to 2.5 up from 1.92 weeks ago which is up from 1 previous to that. > This is in the setting of her current work-up for elevated immunoglobulin levels which may be due to multiple myeloma, she is following with heme-onc for this. > There is a possibility that a degree of prerenal may be contributing, she did get 1.5 L in the ED. - We will continue IV fluids overnight - Avoid nephrotoxic agents - Trend renal function and electrolytes  Abdominal pain > Patient has had some previous episodes abdominal pain the last few months.  In her evaluation by heme-onc they did not believe this is related to that process in beginning of this month. > CT in  February did not show explanation for the pain. > Pain has improved with pain medication in the ED > Ultrasound evaluation of aorta  showed only 3 cm AAA with recommendation for 3-year follow-up. > Normal lipase > Given other negative findings and epigastric pain that is relieved with food and Pepto-Bismol.  Concern is for peptic ulcer or other gastritis. - Start PPI - Continue supportive care - If pain is persistent or worsens can consider repeating CT, though given AKI will likely avoid contrast  Syncope > Patient has reportedly had a couple episodes of syncope in the setting of her severe pain. > This appears to be vasovagal or neurocardiogenic given the setting around her pain. - We will monitor on telemetry - VQ scan ordered in ED given elevated D-dimer, will follow up results  Elevated immunoglobulin level > Undergoing work-up outpatient for this with hematology/oncology - May be contributing to her AKI - Can consider inpatient consult if needed  Anemia > Gradually worsening anemia in the setting of her work-up for her elevated immunoglobulin level. > Hemoglobin currently 8.6 which is stable from the last couple of days.  However her hemoglobin was 11 2 months ago. - Continue to trend hemoglobin  Hypertension - Continue home Coreg and amlodipine  Hyperlipidemia - Continue home rosuvastatin  Diabetes - SSI  DVT prophylaxis: Lovenox  Code Status:   Full Family Communication:  Discussed with daughter at bedside Disposition Plan:   Patient is from:  Home  Anticipated DC to:  Home  Anticipated DC date:  1 to 3 days  Anticipated DC barriers: None  Consults called:  None, could consider inpatient heme-onc consult considering her ongoing work-up.   Admission status:  Observation, telemetry   Severity of Illness: The appropriate patient status for this patient is OBSERVATION. Observation status is judged to be reasonable and necessary in order to provide the required  intensity of service to ensure the patient's safety. The patient's presenting symptoms, physical exam findings, and initial radiographic and laboratory data in the context of their medical condition is felt to place them at decreased risk for further clinical deterioration. Furthermore, it is anticipated that the patient will be medically stable for discharge from the hospital within 2 midnights of admission. The following factors support the patient status of observation.   " The patient's presenting symptoms include abdominal pain, syncope. " The physical exam findings include epigastric tenderness, murmur. " The initial radiographic and laboratory data are 3 cm AAA, creatinine elevated 2.5 up from baseline of 1.  Hemoglobin 8.6, platelets 139.  D-dimer 1.65.Marland Kitchen   Marcelyn Bruins MD [facility]  How to contact the [facility] Attending or Consulting provider [place] or covering provider during after hours [place], for this patient?   1. Check the care team in [facility] and look for a) attending/consulting [facility] provider listed and b) the [facility] team listed 2. Log into [email or web address] and use [employer]'s universal password to access. If you do not have the password, please contact the hospital operator. 3. Locate the [facility] provider you are looking for under [facility] and page to a number that you can be directly reached. 4. If you still have difficulty reaching the provider, please page the [facility] (Director on Call) for the Hospitalists listed on amion for assistance.  09/10/2020, 11:58 PM

## 2020-09-10 NOTE — ED Notes (Signed)
Spoke with lab to find out if the troponin order can be added onto the blood work that is already down in the lab, lab said that it can be done and they would run it.

## 2020-09-10 NOTE — ED Notes (Signed)
Pure Wick placed on patient to obtain urine sample.

## 2020-09-10 NOTE — ED Triage Notes (Signed)
Pt c/o abd pain x 1 day, denies n/v/d. Daughter states pain becomes so severe that pt has had 2 syncopal episodes.

## 2020-09-10 NOTE — ED Provider Notes (Signed)
[facility] DEPT Provider Note   CSN: [id number] Arrival date & time: 09/10/20  1819     History Chief Complaint  Patient presents with  . Abdominal Pain    Madeline Burke is a 87 y.o. female.  HPI Patient presents with abdominal pain.  History is initially obtained by the patient, but her daughter arrives after the initial evaluation.  Patient is undergoing evaluation for multiple myeloma, had bone marrow biopsy performed 2 days ago. Pain seems to be in the upper abdomen, nonradiating, severe, episodic, and currently not appreciable.  However, the pain was severe earlier.  Today, she had 2 episodes of syncope, reportedly.  These episodes were brief, and the patient does not recall any chest pain before or after the events. Her evaluation for multiple myeloma is ongoing, she has not started any therapy.      Past Medical History:  Diagnosis Date  . Diabetes mellitus without complication ([place])   . Elevated cholesterol   . History of angioedema 2006  . Hypertension     Patient Active Problem List   Diagnosis Date Noted  . Angio-edema 02/08/2018    Past Surgical History:  Procedure Laterality Date  . ABDOMINAL HYSTERECTOMY  1997  . BREAST CYST EXCISION Right 1997  . VENOUS ABLATION  04/06/2013   right leg      OB History   No obstetric history on file.     Family History  Problem Relation Age of Onset  . Diabetes Other   . Hyperlipidemia Other   . Hypertension Other     Social History   Tobacco Use  . Smoking status: Never Smoker  . Smokeless tobacco: Never Used  Substance Use Topics  . Alcohol use: No  . Drug use: Not Currently    Home Medications Prior to Admission medications   Medication Sig Start Date End Date Taking? Authorizing Provider  amLODipine (NORVASC) 2.5 MG tablet Take 2.5 mg by mouth daily. 01/04/18   [provider]  carvedilol (COREG) 25 MG tablet Take 25 mg by mouth 2 (two) times daily with a  meal.  04/15/14   [provider]  Cholecalciferol (VITAMIN D3) 2000 units TABS Take 2,000 Units by mouth daily.    [provider]  cycloSPORINE (RESTASIS) 0.05 % ophthalmic emulsion Place 1 drop into both eyes 2 (two) times daily.     [provider]  EPINEPHrine (EPIPEN 2-PAK) 0.3 mg/0.3 mL IJ SOAJ injection Inject 0.3 mLs into the muscle as needed for anaphylaxis.    [provider]  fexofenadine (ALLEGRA) 180 MG tablet Take 1 tablet (180 mg total) by mouth daily. 05/09/18 05/13/19  Valentina Shaggy, MD  mirtazapine (REMERON) 15 MG tablet Take 15 mg by mouth at bedtime.    [provider]  Multiple Vitamin (MULTIVITAMIN WITH MINERALS) TABS tablet Take 1 tablet by mouth daily. Centrum Silver    [provider]  pioglitazone-metformin (ACTOPLUS MET) 15-850 MG per tablet Take 1 tablet by mouth daily.     [provider]  rosuvastatin (CRESTOR) 10 MG tablet Take 10 mg by mouth daily.    [provider]  triamcinolone cream (KENALOG) 0.1 % Apply 1 application topically 2 (two) times daily. 01/02/19   Valentina Shaggy, MD    Allergies    Aspirin, Bee venom, Famotidine, Fish allergy, Fish-derived products, Lisinopril, Other, Penicillamine, Penicillins, Shellfish allergy, Sulfa antibiotics, Sulfasalazine, and Flagyl [metronidazole]  Review of Systems   Review of Systems  Constitutional:  Per HPI, otherwise negative  HENT:       Per HPI, otherwise negative  Respiratory:       Per HPI, otherwise negative  Cardiovascular:       Per HPI, otherwise negative  Gastrointestinal: Negative for vomiting.  Endocrine:       Negative aside from HPI  Genitourinary:       Neg aside from HPI   Musculoskeletal:       Per HPI, otherwise negative  Skin: Negative.   Neurological: Negative for syncope.  Hematological:       Ongoing eval for MM, as above    Physical Exam Updated Vital Signs BP (!) 144/66 (BP Location:  Right Arm)   Pulse 62   Temp 98.2 F (36.8 C) (Oral)   Resp 16   Ht _0  (1.575 m)   Wt 56.7 kg   SpO2 98%   BMI 22.86 kg/m   Physical Exam Vitals and nursing note reviewed.  Constitutional:      General: She is not in acute distress.    Comments: Frail-appearing elderly female  HENT:     Head: Normocephalic and atraumatic.  Eyes:     Conjunctiva/sclera: Conjunctivae normal.  Cardiovascular:     Rate and Rhythm: Normal rate and regular rhythm.  Pulmonary:     Effort: Pulmonary effort is normal. No respiratory distress.     Breath sounds: Normal breath sounds. No stridor.  Abdominal:     General: There is no distension.     Tenderness: There is abdominal tenderness in the epigastric area.  Skin:    General: Skin is warm and dry.  Neurological:     Mental Status: She is alert and oriented to person, place, and time.     Cranial Nerves: No cranial nerve deficit.     ED Results / Procedures / Treatments   Labs (all labs ordered are listed, but only abnormal results are displayed) Labs Reviewed  COMPREHENSIVE METABOLIC PANEL - Abnormal; Notable for the following components:      Result Value   CO2 21 (*)    Glucose, Bld 109 (*)    BUN 25 (*)    Creatinine, Ser 2.57 (*)    GFR, Estimated 18 (*)    All other components within normal limits  CBC WITH DIFFERENTIAL/PLATELET - Abnormal; Notable for the following components:   RBC 2.97 (*)    Hemoglobin 8.6 (*)    HCT 26.7 (*)    RDW 18.1 (*)    Platelets 139 (*)    Monocytes Absolute 1.7 (*)    All other components within normal limits  D-DIMER, QUANTITATIVE - Abnormal; Notable for the following components:   D-Dimer, Quant 1.65 (*)    All other components within normal limits  LIPASE, BLOOD  URINALYSIS, ROUTINE W REFLEX MICROSCOPIC    EKG EKG Interpretation  Date/Time:  Wednesday September 10 2020 20:51:29 EDT Ventricular Rate:  57 PR Interval:    QRS Duration: 97 QT Interval:  422 QTC Calculation: 411 R  Axis:   -34 Text Interpretation: Sinus rhythm Abnormal R-wave progression, early transition Left ventricular hypertrophy Premature atrial complexes Confirmed by Carmin Muskrat [phone]) on 09/10/2020 9:40:03 PM   Radiology US Aorta  Result Date: 09/10/2020 CLINICAL DATA:  87 year old female with concern for aortic Disease. EXAM: ULTRASOUND OF ABDOMINAL AORTA TECHNIQUE: Ultrasound examination of the abdominal aorta and proximal common iliac arteries was performed to evaluate for aneurysm. Additional color and Doppler images of the distal aorta were  obtained to document patency. COMPARISON:  CT abdomen pelvis dated 08/01/2020. FINDINGS: Evaluation is limited due to overlying bowel gas and inability of patient to cooperate with exam. There is atherosclerotic calcification of the visualized abdominal aorta. Abdominal aortic measurements as follows: Proximal:  2.9 x 3.0 cm Mid:  1.9 x 1.7 cm Distal: Not visualized due to bowel gas. Patent: Not evaluated. Right common iliac artery: Not visualized due to bowel gas. Left common iliac artery: Not visualized due to bowel gas. IMPRESSION: 1. Abdominal aortic aneurysm measuring 3 cm proximally. Recommend follow-up ultrasound every 3 years. This recommendation follows ACR consensus guidelines: White Paper of the ACR Incidental Findings Committee II on Vascular Findings. J Am Coll Radiol 2013; 10:789-794. 2.  Aortic Atherosclerosis (ICD10-I70.0). Electronically Signed   By: Anner Crete M.D.   On: 09/10/2020 21:45    Procedures Procedures   Medications Ordered in ED Medications  sodium chloride 0.9 % bolus 1,000 mL (has no administration in time range)  sodium chloride 0.9 % bolus 500 mL (500 mLs Intravenous New Bag/Given 09/10/20 2015)  fentaNYL (SUBLIMAZE) injection 25 mcg (25 mcg Intravenous Given 09/10/20 2015)  ondansetron (ZOFRAN) injection 4 mg (4 mg Intravenous Given 09/10/20 2015)    ED Course  I have reviewed the triage vital signs and the nursing  notes.  Pertinent labs & imaging results that were available during my care of the patient were reviewed by me and considered in my medical decision making (see chart for details).  On repeat examthe patient is feeling better, having received fentanyl, antiemetics. Initial lab results available, gust with the patient and her daughter. Patient found to have acute kidney injury, elevated D-dimer, electrolytes otherwise unremarkable. Troponin is pending. She did not have chest pain, EKG is nonischemic, low consideration of syncope, pain, prior differential including aortic dissection, aneurysm rupture, remains. 11:10 PM Ultrasound reviewed, no evidence for aortic disruption.  Patient is feeling somewhat better, vital signs remain unremarkable. Given her elevated D-dimer and acute kidney injury she will have a fluid resuscitation, plan is for next a VQ scan with consideration of pulmonary embolism contributing to her syncope.  Abdomen remains soft, nonperitoneal, though she does have intermittent epigastric discomfort. Given AKI, syncope, ongoing evaluation for multiple myeloma, patient be admitted for further monitoring, management.  Final Clinical Impression(s) / ED Diagnoses Final diagnoses:  Syncope and collapse  Epigastric pain  AKI (acute kidney injury) ([place])     Carmin Muskrat, MD 09/10/20 2311

## 2020-09-11 ENCOUNTER — Observation Stay (HOSPITAL_COMMUNITY): Payer: Medicare Other

## 2020-09-11 ENCOUNTER — Telehealth: Payer: Self-pay | Admitting: *Deleted

## 2020-09-11 DIAGNOSIS — R627 Adult failure to thrive: Secondary | ICD-10-CM | POA: Diagnosis not present

## 2020-09-11 DIAGNOSIS — R0602 Shortness of breath: Secondary | ICD-10-CM | POA: Diagnosis not present

## 2020-09-11 DIAGNOSIS — R0902 Hypoxemia: Secondary | ICD-10-CM | POA: Diagnosis not present

## 2020-09-11 DIAGNOSIS — T380X5A Adverse effect of glucocorticoids and synthetic analogues, initial encounter: Secondary | ICD-10-CM | POA: Diagnosis not present

## 2020-09-11 DIAGNOSIS — E7800 Pure hypercholesterolemia, unspecified: Secondary | ICD-10-CM | POA: Diagnosis not present

## 2020-09-11 DIAGNOSIS — Y848 Other medical procedures as the cause of abnormal reaction of the patient, or of later complication, without mention of misadventure at the time of the procedure: Secondary | ICD-10-CM | POA: Diagnosis not present

## 2020-09-11 DIAGNOSIS — T8140XA Infection following a procedure, unspecified, initial encounter: Secondary | ICD-10-CM | POA: Diagnosis not present

## 2020-09-11 DIAGNOSIS — I97610 Postprocedural hemorrhage and hematoma of a circulatory system organ or structure following a cardiac catheterization: Secondary | ICD-10-CM | POA: Diagnosis not present

## 2020-09-11 DIAGNOSIS — E872 Acidosis: Secondary | ICD-10-CM | POA: Diagnosis not present

## 2020-09-11 DIAGNOSIS — M2550 Pain in unspecified joint: Secondary | ICD-10-CM | POA: Diagnosis not present

## 2020-09-11 DIAGNOSIS — E1165 Type 2 diabetes mellitus with hyperglycemia: Secondary | ICD-10-CM | POA: Diagnosis not present

## 2020-09-11 DIAGNOSIS — D689 Coagulation defect, unspecified: Secondary | ICD-10-CM | POA: Diagnosis not present

## 2020-09-11 DIAGNOSIS — Z7401 Bed confinement status: Secondary | ICD-10-CM | POA: Diagnosis not present

## 2020-09-11 DIAGNOSIS — N179 Acute kidney failure, unspecified: Secondary | ICD-10-CM | POA: Diagnosis present

## 2020-09-11 DIAGNOSIS — Z86711 Personal history of pulmonary embolism: Secondary | ICD-10-CM | POA: Diagnosis not present

## 2020-09-11 DIAGNOSIS — R4182 Altered mental status, unspecified: Secondary | ICD-10-CM | POA: Diagnosis not present

## 2020-09-11 DIAGNOSIS — T79A12A Traumatic compartment syndrome of left upper extremity, initial encounter: Secondary | ICD-10-CM | POA: Diagnosis not present

## 2020-09-11 DIAGNOSIS — Z6822 Body mass index (BMI) 22.0-22.9, adult: Secondary | ICD-10-CM | POA: Diagnosis not present

## 2020-09-11 DIAGNOSIS — M79601 Pain in right arm: Secondary | ICD-10-CM | POA: Diagnosis not present

## 2020-09-11 DIAGNOSIS — L7632 Postprocedural hematoma of skin and subcutaneous tissue following other procedure: Secondary | ICD-10-CM | POA: Diagnosis not present

## 2020-09-11 DIAGNOSIS — S45192A Other specified injury of brachial artery, left side, initial encounter: Secondary | ICD-10-CM | POA: Diagnosis not present

## 2020-09-11 DIAGNOSIS — I700 Atherosclerosis of aorta: Secondary | ICD-10-CM | POA: Diagnosis not present

## 2020-09-11 DIAGNOSIS — F0390 Unspecified dementia without behavioral disturbance: Secondary | ICD-10-CM | POA: Diagnosis present

## 2020-09-11 DIAGNOSIS — R1013 Epigastric pain: Secondary | ICD-10-CM | POA: Diagnosis not present

## 2020-09-11 DIAGNOSIS — Z20822 Contact with and (suspected) exposure to covid-19: Secondary | ICD-10-CM | POA: Diagnosis not present

## 2020-09-11 DIAGNOSIS — E119 Type 2 diabetes mellitus without complications: Secondary | ICD-10-CM | POA: Diagnosis not present

## 2020-09-11 DIAGNOSIS — R569 Unspecified convulsions: Secondary | ICD-10-CM | POA: Diagnosis not present

## 2020-09-11 DIAGNOSIS — Z86718 Personal history of other venous thrombosis and embolism: Secondary | ICD-10-CM | POA: Diagnosis not present

## 2020-09-11 DIAGNOSIS — S45102A Unspecified injury of brachial artery, left side, initial encounter: Secondary | ICD-10-CM | POA: Diagnosis not present

## 2020-09-11 DIAGNOSIS — Z66 Do not resuscitate: Secondary | ICD-10-CM | POA: Diagnosis not present

## 2020-09-11 DIAGNOSIS — I959 Hypotension, unspecified: Secondary | ICD-10-CM | POA: Diagnosis not present

## 2020-09-11 DIAGNOSIS — N1832 Chronic kidney disease, stage 3b: Secondary | ICD-10-CM | POA: Diagnosis not present

## 2020-09-11 DIAGNOSIS — D510 Vitamin B12 deficiency anemia due to intrinsic factor deficiency: Secondary | ICD-10-CM | POA: Diagnosis not present

## 2020-09-11 DIAGNOSIS — R1084 Generalized abdominal pain: Secondary | ICD-10-CM | POA: Diagnosis not present

## 2020-09-11 DIAGNOSIS — D696 Thrombocytopenia, unspecified: Secondary | ICD-10-CM | POA: Diagnosis not present

## 2020-09-11 DIAGNOSIS — E875 Hyperkalemia: Secondary | ICD-10-CM | POA: Diagnosis not present

## 2020-09-11 DIAGNOSIS — N178 Other acute kidney failure: Secondary | ICD-10-CM | POA: Diagnosis not present

## 2020-09-11 DIAGNOSIS — I361 Nonrheumatic tricuspid (valve) insufficiency: Secondary | ICD-10-CM | POA: Diagnosis not present

## 2020-09-11 DIAGNOSIS — R768 Other specified abnormal immunological findings in serum: Secondary | ICD-10-CM | POA: Diagnosis not present

## 2020-09-11 DIAGNOSIS — I10 Essential (primary) hypertension: Secondary | ICD-10-CM | POA: Diagnosis not present

## 2020-09-11 DIAGNOSIS — I129 Hypertensive chronic kidney disease with stage 1 through stage 4 chronic kidney disease, or unspecified chronic kidney disease: Secondary | ICD-10-CM | POA: Diagnosis not present

## 2020-09-11 DIAGNOSIS — Z7189 Other specified counseling: Secondary | ICD-10-CM | POA: Diagnosis not present

## 2020-09-11 DIAGNOSIS — M79A12 Nontraumatic compartment syndrome of left upper extremity: Secondary | ICD-10-CM | POA: Diagnosis not present

## 2020-09-11 DIAGNOSIS — R402411 Glasgow coma scale score 13-15, in the field [EMT or ambulance]: Secondary | ICD-10-CM | POA: Diagnosis not present

## 2020-09-11 DIAGNOSIS — M7989 Other specified soft tissue disorders: Secondary | ICD-10-CM | POA: Diagnosis not present

## 2020-09-11 DIAGNOSIS — R410 Disorientation, unspecified: Secondary | ICD-10-CM | POA: Diagnosis not present

## 2020-09-11 DIAGNOSIS — C9000 Multiple myeloma not having achieved remission: Secondary | ICD-10-CM | POA: Diagnosis not present

## 2020-09-11 DIAGNOSIS — D649 Anemia, unspecified: Secondary | ICD-10-CM | POA: Diagnosis not present

## 2020-09-11 DIAGNOSIS — D62 Acute posthemorrhagic anemia: Secondary | ICD-10-CM | POA: Diagnosis not present

## 2020-09-11 DIAGNOSIS — I714 Abdominal aortic aneurysm, without rupture: Secondary | ICD-10-CM | POA: Diagnosis not present

## 2020-09-11 DIAGNOSIS — Z515 Encounter for palliative care: Secondary | ICD-10-CM | POA: Diagnosis not present

## 2020-09-11 DIAGNOSIS — R55 Syncope and collapse: Secondary | ICD-10-CM | POA: Diagnosis not present

## 2020-09-11 LAB — CBC
HCT: 27.4 % — ABNORMAL LOW (ref 36.0–46.0)
Hemoglobin: 8.8 g/dL — ABNORMAL LOW (ref 12.0–15.0)
MCH: 28.5 pg (ref 26.0–34.0)
MCHC: 32.1 g/dL (ref 30.0–36.0)
MCV: 88.7 fL (ref 80.0–100.0)
Platelets: 126 10*3/uL — ABNORMAL LOW (ref 150–400)
RBC: 3.09 MIL/uL — ABNORMAL LOW (ref 3.87–5.11)
RDW: 17.9 % — ABNORMAL HIGH (ref 11.5–15.5)
WBC: 6.4 10*3/uL (ref 4.0–10.5)
nRBC: 0 % (ref 0.0–0.2)

## 2020-09-11 LAB — GLUCOSE, CAPILLARY
Glucose-Capillary: 81 mg/dL (ref 70–99)
Glucose-Capillary: 115 mg/dL — ABNORMAL HIGH (ref 70–99)
Glucose-Capillary: 81 mg/dL (ref 70–99)
Glucose-Capillary: 70 mg/dL (ref 70–99)

## 2020-09-11 LAB — BASIC METABOLIC PANEL
Anion gap: 9 (ref 5–15)
BUN: 22 mg/dL (ref 8–23)
CO2: 21 mmol/L — ABNORMAL LOW (ref 22–32)
Calcium: 9.1 mg/dL (ref 8.9–10.3)
Chloride: 112 mmol/L — ABNORMAL HIGH (ref 98–111)
Creatinine, Ser: 2.34 mg/dL — ABNORMAL HIGH (ref 0.44–1.00)
GFR, Estimated: 20 mL/min — ABNORMAL LOW (ref >60)
Glucose, Bld: 81 mg/dL (ref 70–99)
Potassium: 4.1 mmol/L (ref 3.5–5.1)
Sodium: 142 mmol/L (ref 135–145)

## 2020-09-11 LAB — TROPONIN I (HIGH SENSITIVITY): Troponin I (High Sensitivity): 20 ng/L — ABNORMAL HIGH (ref <18)

## 2020-09-11 LAB — SARS CORONAVIRUS 2 (TAT 6-24 HRS): SARS Coronavirus 2: NEGATIVE

## 2020-09-11 IMAGING — DX DG CHEST 1V
1 series · 1 of 1 positions shown · non-contrast
Comparison: [DATE].

CLINICAL DATA: History of pulmonary embolism.

EXAM:
CHEST  1 VIEW

[chest ap]
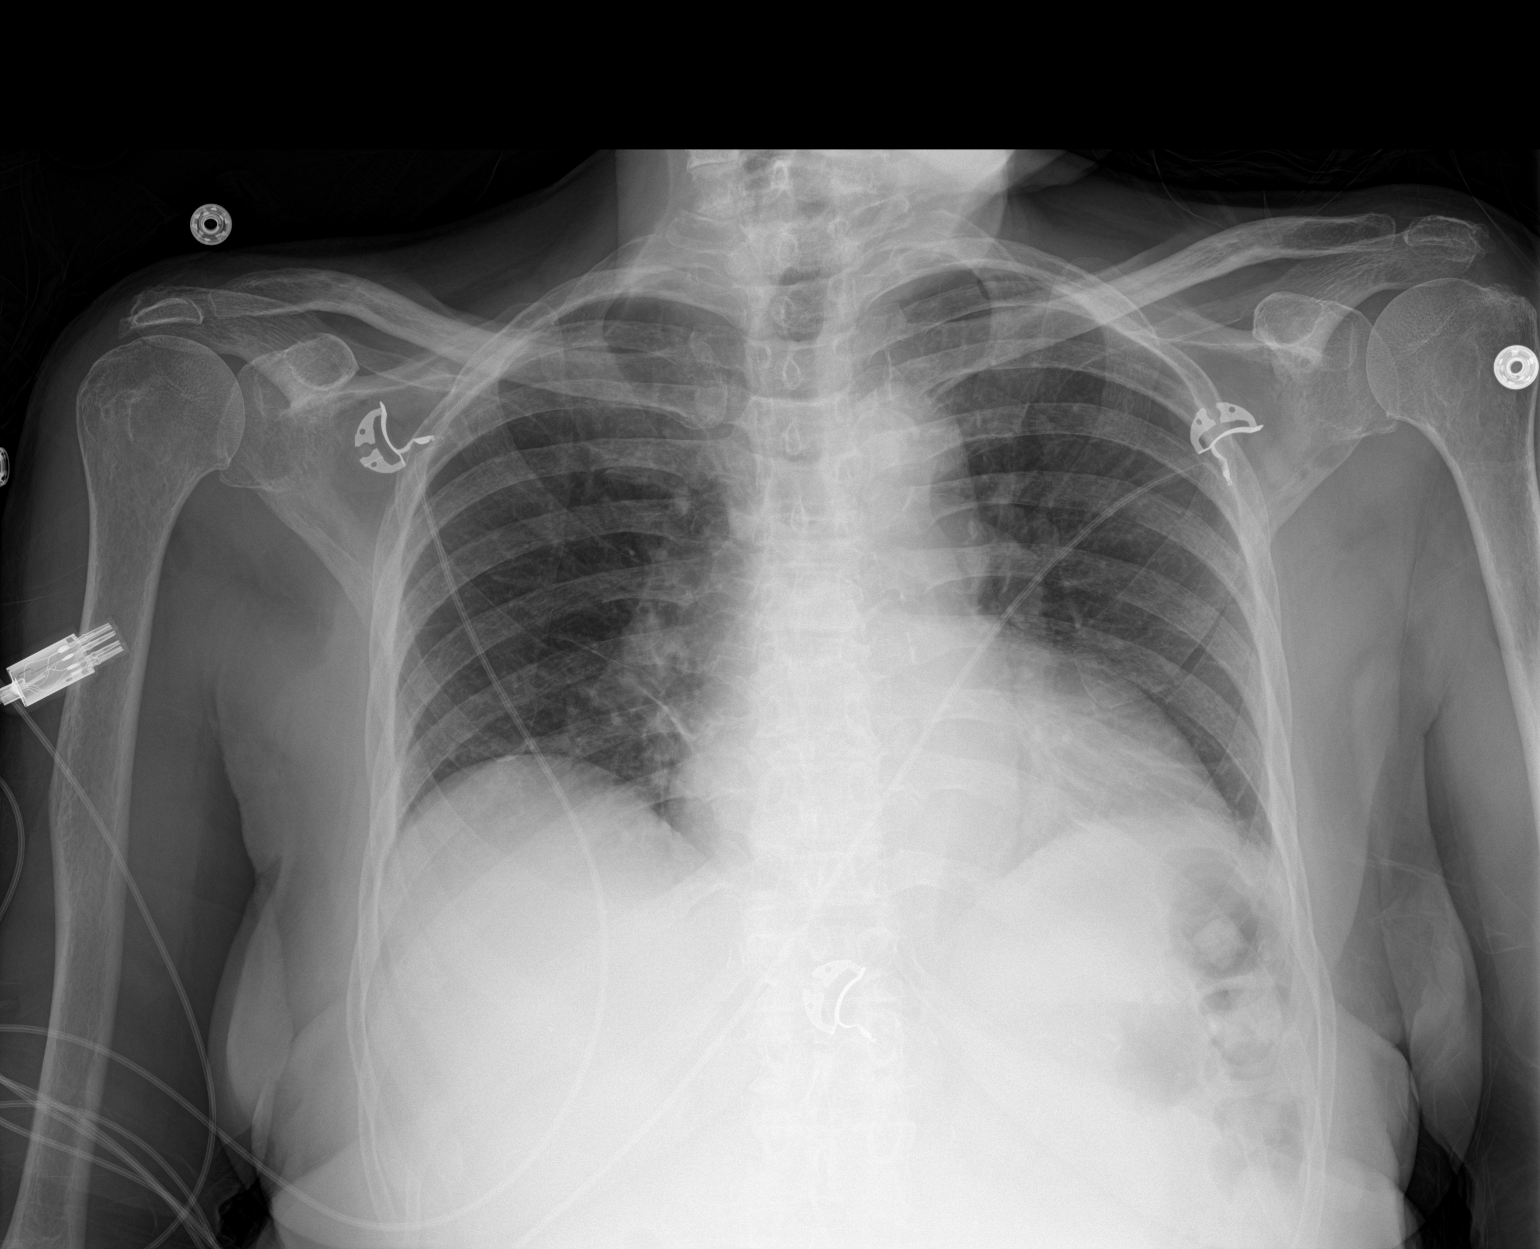

[1 of 1 positions shown; findings below may reference images not displayed]

FINDINGS: The heart size and mediastinal contours are within normal limits.
Both lungs are clear. The visualized skeletal structures are
unremarkable.
IMPRESSION: No active disease.

Aortic Atherosclerosis ([G4]-[G4]).

## 2020-09-11 IMAGING — CT CT HEAD W/O CM
3 series · 15 of 47 positions shown, 18 images · non-contrast
Comparison: [DATE]

CLINICAL DATA: Altered mental status, sudden onset confusion

EXAM:
CT HEAD WITHOUT CONTRAST
TECHNIQUE: Contiguous axial images were obtained from the base of the skull
through the vertex without intravenous contrast.

[Series 2: head wo · axial · 0.47mm/px · z∈[+1203,+1328]mm · 9 of 31 slices shown, 12 images]
[im 3/31  brain]
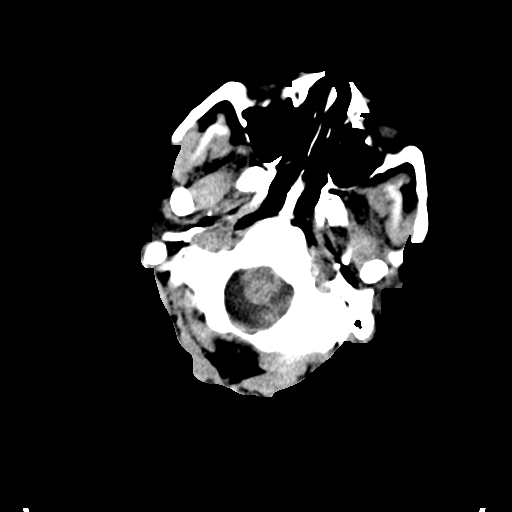
[im 3/31  bone]
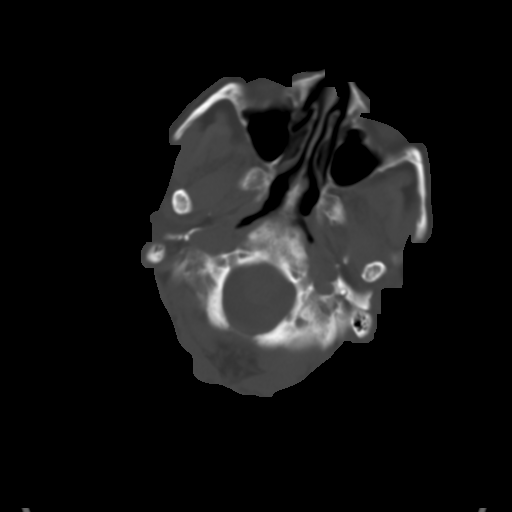
[im 6/31  brain]
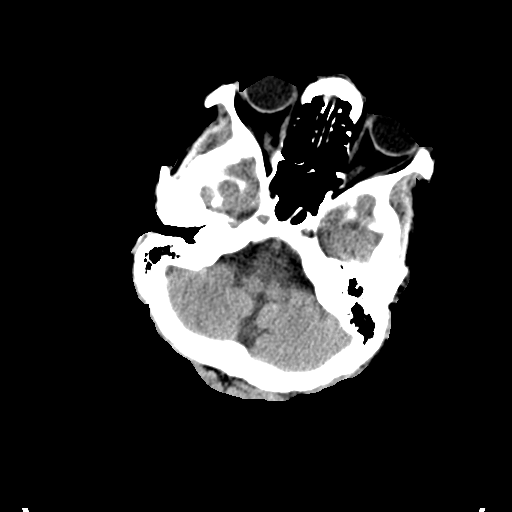
[im 9/31  brain]
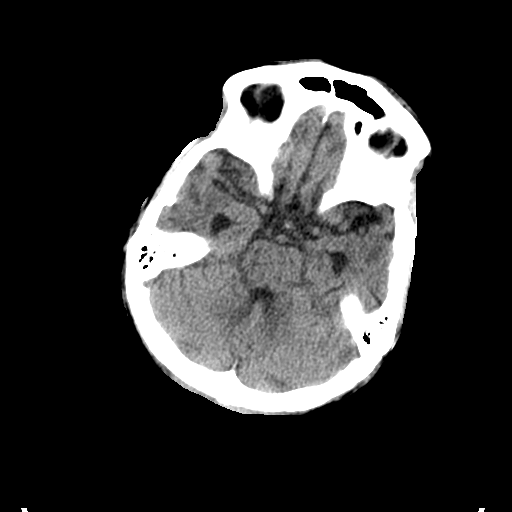
[im 12/31  brain]
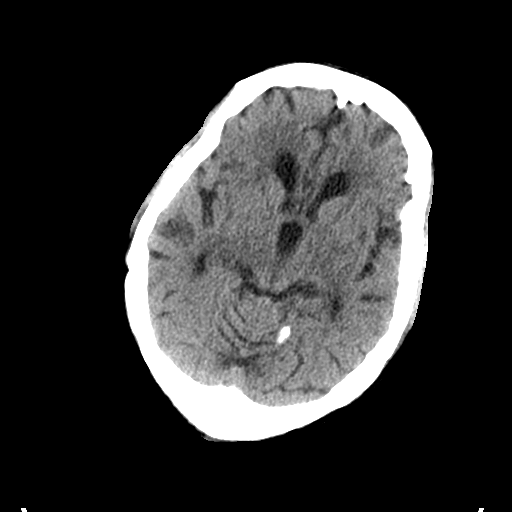
[im 16/31  brain]
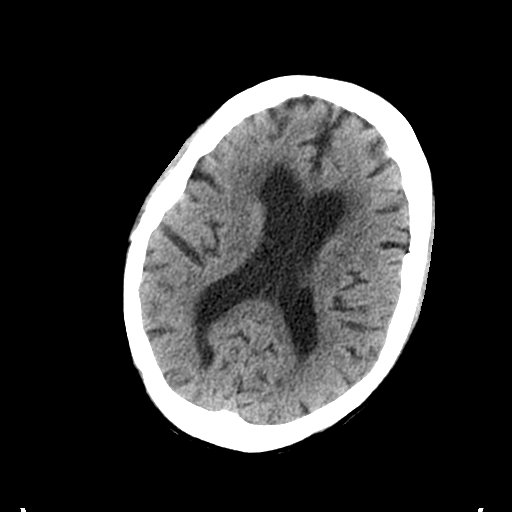
[im 16/31  bone]
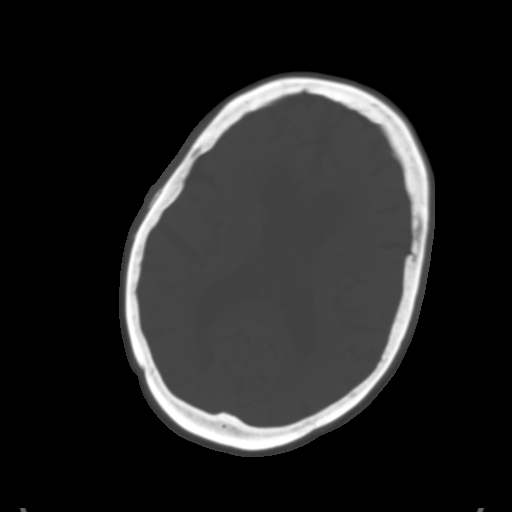
[im 19/31  brain]
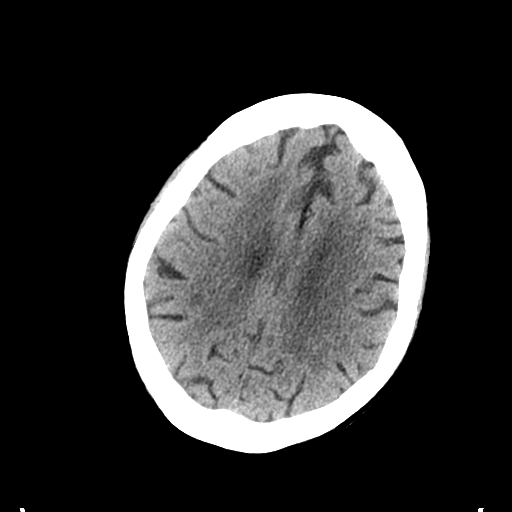
[im 22/31  brain]
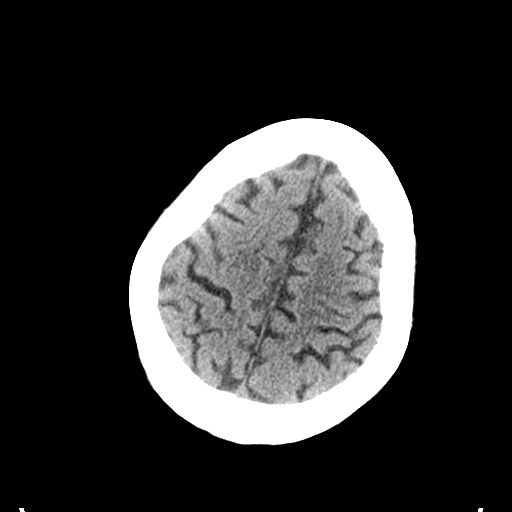
[im 25/31  brain]
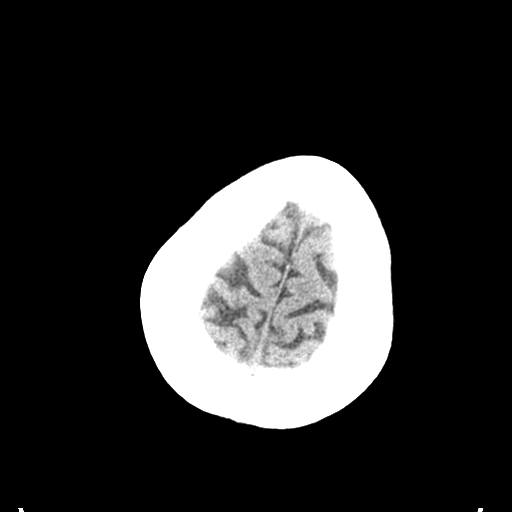
[im 28/31  brain]
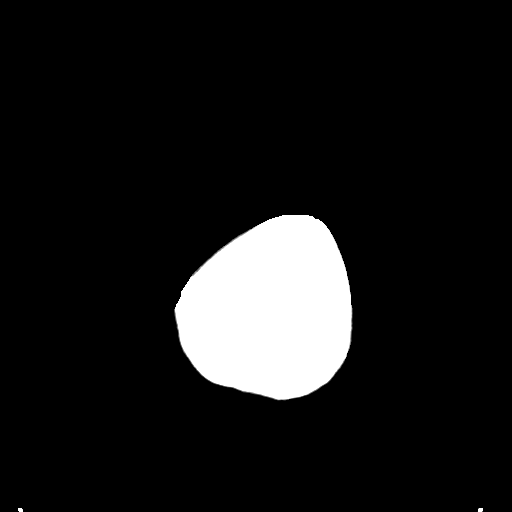
[im 28/31  bone]
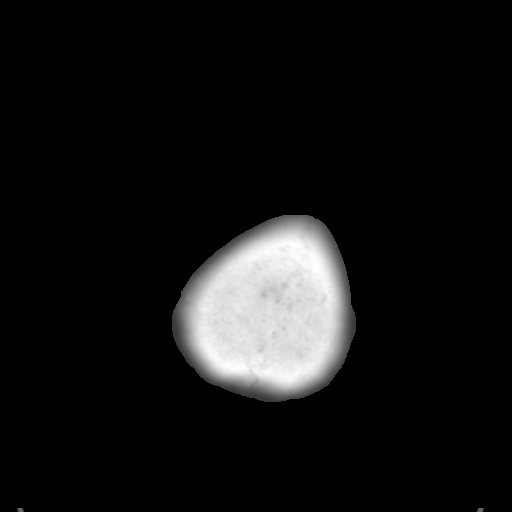

[Series 4: coronal soft tissue · coronal · 0.28mm/px · 3 of 64 slices shown]
[im 22/64  brain]
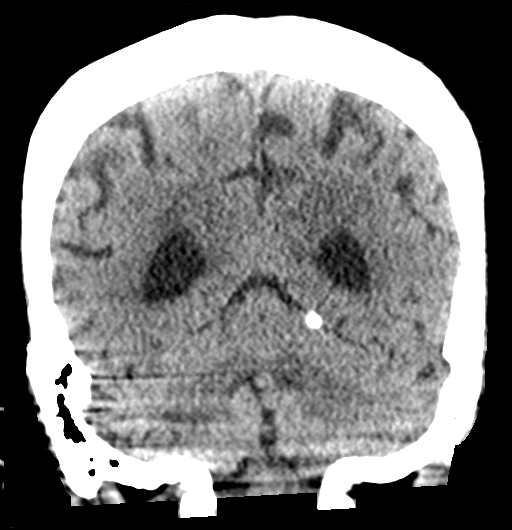
[im 29/64  brain]
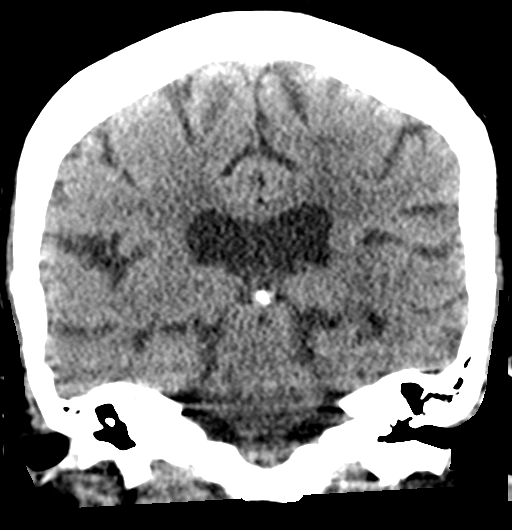
[im 36/64  brain]
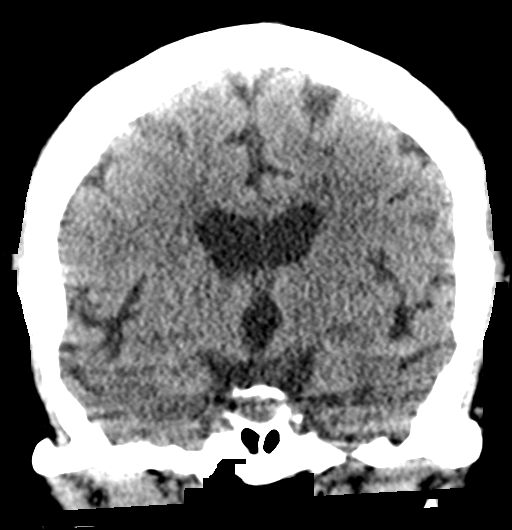

[Series 5: sagittal soft tissue · sagittal · 0.29mm/px · 3 of 47 slices shown]
[im 16/47  brain]
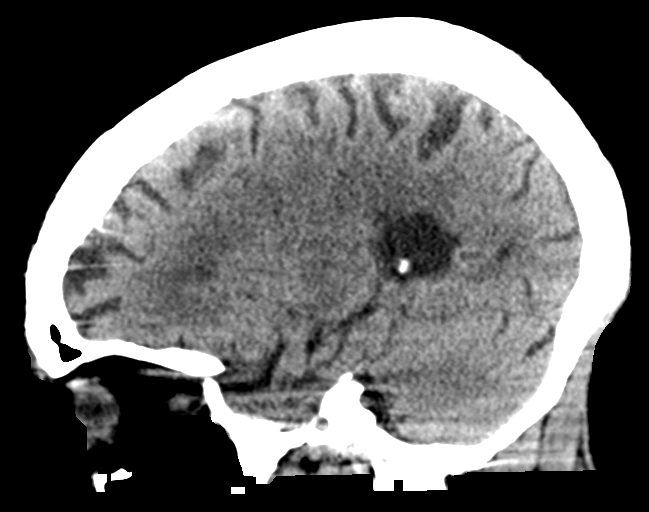
[im 24/47  brain]
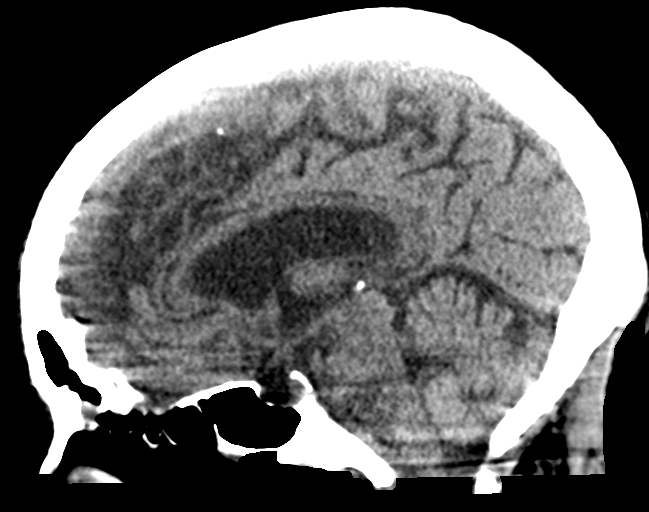
[im 31/47  brain]
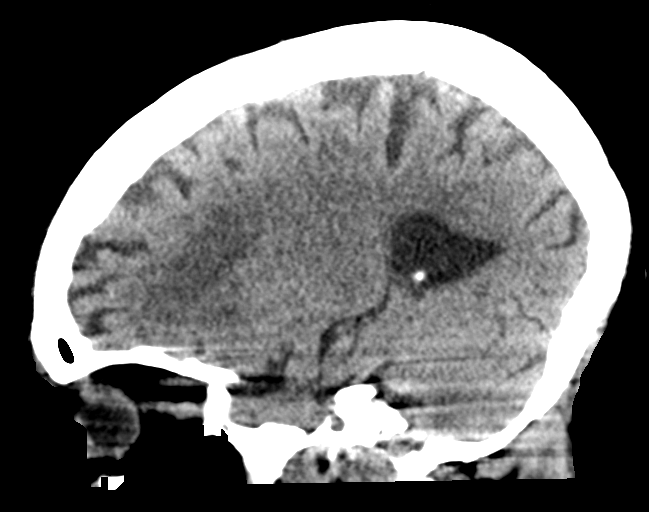

[15 of 47 positions shown; findings below may reference images not displayed]

FINDINGS: Brain: No evidence of acute infarction, hemorrhage, hydrocephalus,
extra-axial collection, visible mass lesion or mass effect. Diffuse
prominence of the ventricles, cisterns and sulci compatible with
parenchymal volume loss and ex vacuo dilatation unchanged from
priors. Patchy areas of white matter hypoattenuation are most
compatible with moderate chronic microvascular angiopathy.

Vascular: Atherosclerotic calcification of the carotid siphons and
intradural vertebral arteries. No hyperdense vessel.

Skull: No calvarial fracture or suspicious osseous lesion. No scalp
swelling or hematoma.

Sinuses/Orbits: Paranasal sinuses and mastoid air cells are
predominantly clear. Orbital structures are unremarkable aside from
prior lens extractions.

Other: None
IMPRESSION: 1. No acute intracranial findings. If there is persisting concern
for acute infarction, MRI is more sensitive and specific for early
features of ischemia.
2. Stable parenchymal volume loss and chronic microvascular
angiopathy.

## 2020-09-11 MED ORDER — GLUCERNA SHAKE PO LIQD
237.0000 mL | ORAL | Status: DC
  Administered 2020-09-11: 237 mL via ORAL
  Filled 2020-09-11 (×2): qty 237

## 2020-09-11 MED ORDER — SODIUM CHLORIDE 0.9 % IV SOLN: INTRAVENOUS | Status: DC

## 2020-09-11 MED ORDER — PROSOURCE PLUS PO LIQD
30.0000 mL | Freq: Every day | ORAL | Status: DC
  Administered 2020-09-12: 30 mL via ORAL
  Filled 2020-09-11: qty 30

## 2020-09-11 MED ORDER — ADULT MULTIVITAMIN W/MINERALS CH
1.0000 | ORAL_TABLET | Freq: Every day | ORAL | Status: DC
  Administered 2020-09-11 – 2020-09-17 (×7): 1 via ORAL
  Filled 2020-09-11 (×7): qty 1

## 2020-09-11 MED ORDER — TECHNETIUM TO 99M ALBUMIN AGGREGATED
3.9000 | Freq: Once | INTRAVENOUS | Status: AC | PRN
  Administered 2020-09-11: 3.9 via INTRAVENOUS

## 2020-09-11 NOTE — ED Notes (Signed)
Entered patient's room, pt sleeping.  Woke pt up to give medications, pt not answering RN's questions, acute confusion noted.  This is a change from earlier today, earlier in shift pt talked to RN, answered questions appropriately and interacted with RN frequently.  Pt's daughter at bedside and stating she is concerned for her mother because mother seems acutely confused and complaining of abdominal pain. Spoke with Dr.Chotiner, MD ordered stat head ct.

## 2020-09-11 NOTE — Telephone Encounter (Signed)
TCT pt's daughter regarding 24 hour urine collection. Spoke with daughter, [name]. [name] states that her mother was admitted to the hospital this morning for syncope and diarrhea. [name] states she collected what she could for pt's 24 hour urine and turned it in this morning.  Will make Dr. Lorenso Courier aware of pt's admission, though he is out of the office until 09/15/20

## 2020-09-11 NOTE — Progress Notes (Signed)
PROGRESS NOTE    GRAVIELA Burke  JYN:[id number] DOB: [birth year] DOA: 09/10/2020 PCP: Leeroy Cha, MD    Brief Narrative:  87 y.o. female with medical history significant of hypertension, hyperlipidemia, diabetes, angioedema, currently being worked up for elevated immunoglobulin levels who presents with abdominal pain.             Patient has had some intermittent abdominal pain for months, first starting in January or February. There was a CT scan in February which did not show exhalation for the pain.             She was noted to have the pain during her oncology visit in March and it was believed not to be related to her elevated immunoglobulin levels and her work-up from their perspective.             She is continued to have waxing and waning abdominal pain.  The pain is difficult to describe but is epigastric per patient.  She states that it does seem to improve with food and her daughter states that it does seem to improve whenever she is taken Pepto-Bismol for diarrhea.  They have not noticed any other aggravating or alleviating factors. In the ED, pt was found to have evidence of ARF with Cr of 2.5, up from 1  Assessment & Plan:   Principal Problem:   AKI (acute kidney injury) ([place]) Active Problems:   Abdominal pain, epigastric   HTN (hypertension)   Diabetes ([place])   Anemia   AKI > Creatinine elevated to 2.5 up from 1.92 weeks ago which is up from 1 previous to that. > This is in the setting of her current work-up for elevated immunoglobulin levels which may be due to multiple myeloma, she is following with heme-onc for this. >Clinically appears dehydrated with dry mucus membranes and poor skin turgor in the setting of poor PO intake - Will continue IVF hydration as tolerated - Avoid nephrotoxic agents - repeat bmet in AM  Abdominal pain > Patient has had some previous episodes abdominal pain the last few months.  In her evaluation by heme-onc they did not  believe this is related to that process in beginning of this month. > CT in February did not show explanation for the pain. > Pain has improved with pain medication in the ED > Ultrasound evaluation of aorta showed only 3 cm AAA with recommendation for 3-year follow-up. > Normal lipase > Given other negative findings and epigastric pain that is relieved with food and Pepto-Bismol.  Concern is for peptic ulcer or other gastritis. - Family reports prior GI workup, notably EGD, by [facility] as being unremarkable - Currently on PPI - Continue supportive care for now  Syncope > Patient has reportedly had a couple episodes of syncope in the setting of her severe pain. > Suspect possible vasovagal or neurocardiogenic given the setting around her pain. - cont to monitor on telemetry - VQ scan ordered in ED given elevated D-dimer, reviewed and is low prob for PE  Elevated immunoglobulin level > Undergoing work-up outpatient for this with hematology/oncology - May be contributing to her AKI - Cont work up per hematology  Anemia > Gradually worsening anemia in the setting of her work-up for her elevated immunoglobulin level. > Hemoglobin currently 8.6 which is stable from the last couple of days.  However her hemoglobin was 11 2 months ago. - Continue to trend hemoglobin  Hypertension - Continue home Coreg and amlodipine  Hyperlipidemia -  Continue home rosuvastatin as tolerated  Diabetes - continue SSI coverage while in hosptial  DVT prophylaxis: Lovenox subq Code Status: Full Family Communication: Pt in room, family at bedside  Status is: Observation  The patient will require care spanning > 2 midnights and should be moved to inpatient because: IV treatments appropriate due to intensity of illness or inability to take PO and Inpatient level of care appropriate due to severity of illness  Dispo: The patient is from: Home              Anticipated d/c is to: Home               Patient currently is not medically stable to d/c.   Difficult to place patient No       Consultants:     Procedures:     Antimicrobials: Anti-infectives (From admission, onward)   None       Subjective: Complaining of abd discomfort, not feeling hungry. Family has reported marked wt loss over the past 4 months  Objective: Vitals:   09/11/20 0330 09/11/20 0400 09/11/20 0512 09/11/20 1436  BP: (!) 145/55 (!) 129/56 (!) 156/63 127/64  Pulse: 63 (!) 57 60 (!) 59  Resp: 20 17 17 18   Temp:   (!) 97.5 F (36.4 C) 98.2 F (36.8 C)  TempSrc:   Oral Oral  SpO2: 98% 97% 100% 100%  Weight:      Height:        Intake/Output Summary (Last 24 hours) at 09/11/2020 1711 Last data filed at 09/10/2020 2305 Gross per 24 hour  Intake --  Output 350 ml  Net -350 ml   Filed Weights   09/10/20 1829  Weight: 56.7 kg    Examination:  General exam: Appears calm and comfortable  Respiratory system: Clear to auscultation. Respiratory effort normal. Cardiovascular system: S1 & S2 heard, Regular Gastrointestinal system: Abdomen is nondistended, epigastric tenderness on palpation Central nervous system: Alert and oriented. No focal neurological deficits. Extremities: Symmetric 5 x 5 power. Skin: No rashes, lesions Psychiatry: Judgement and insight appear normal. Mood & affect appropriate.   Data Reviewed: I have personally reviewed following labs and imaging studies  CBC: Recent Labs  Lab 09/08/20 0800 09/10/20 1910 09/11/20 0527  WBC 5.5 7.1 6.4  NEUTROABS 1.3* 1.8  --   HGB 8.8* 8.6* 8.8*  HCT 28.1* 26.7* 27.4*  MCV 89.8 89.9 88.7  PLT 148* 139* 283*   Basic Metabolic Panel: Recent Labs  Lab 09/10/20 1910 09/11/20 0527  NA 144 142  K 4.6 4.1  CL 111 112*  CO2 21* 21*  GLUCOSE 109* 81  BUN 25* 22  CREATININE 2.57* 2.34*  CALCIUM 9.6 9.1   GFR: Estimated Creatinine Clearance: 13.9 mL/min (A) (by C-G formula based on SCr of 2.34 mg/dL (H)). Liver Function  Tests: Recent Labs  Lab 09/10/20 1910  AST 24  ALT 13  ALKPHOS 58  BILITOT 0.3  PROT 7.4  ALBUMIN 4.3   Recent Labs  Lab 09/10/20 1910  LIPASE 51   No results for input(s): AMMONIA in the last 168 hours. Coagulation Profile: No results for input(s): INR, PROTIME in the last 168 hours. Cardiac Enzymes: No results for input(s): CKTOTAL, CKMB, CKMBINDEX, TROPONINI in the last 168 hours. BNP (last 3 results) No results for input(s): PROBNP in the last 8760 hours. HbA1C: No results for input(s): HGBA1C in the last 72 hours. CBG: Recent Labs  Lab 09/08/20 0808 09/11/20 0747 09/11/20 1156  GLUCAP 93  81 115*   Lipid Profile: No results for input(s): CHOL, HDL, LDLCALC, TRIG, CHOLHDL, LDLDIRECT in the last 72 hours. Thyroid Function Tests: No results for input(s): TSH, T4TOTAL, FREET4, T3FREE, THYROIDAB in the last 72 hours. Anemia Panel: No results for input(s): VITAMINB12, FOLATE, FERRITIN, TIBC, IRON, RETICCTPCT in the last 72 hours. Sepsis Labs: No results for input(s): PROCALCITON, LATICACIDVEN in the last 168 hours.  Recent Results (from the past 240 hour(s))  SARS CORONAVIRUS 2 (TAT 6-24 HRS) Nasopharyngeal Nasopharyngeal Swab     Status: None   Collection Time: 09/10/20 10:42 PM   Specimen: Nasopharyngeal Swab  Result Value Ref Range Status   SARS Coronavirus 2 NEGATIVE NEGATIVE Final    Comment: (NOTE) SARS-CoV-2 target nucleic acids are NOT DETECTED.  The SARS-CoV-2 RNA is generally detectable in upper and lower respiratory specimens during the acute phase of infection. Negative results do not preclude SARS-CoV-2 infection, do not rule out co-infections with other pathogens, and should not be used as the sole basis for treatment or other patient management decisions. Negative results must be combined with clinical observations, patient history, and epidemiological information. The expected result is Negative.  Fact Sheet for  Patients: SugarRoll.be  Fact Sheet for Healthcare Providers: https://www.woods-mathews.com/  This test is not yet approved or cleared by the [place] FDA and  has been authorized for detection and/or diagnosis of SARS-CoV-2 by FDA under an Emergency Use Authorization (EUA). This EUA will remain  in effect (meaning this test can be used) for the duration of the COVID-19 declaration under Se ction 564(b)(1) of the Act, 21 U.S.C. section 360bbb-3(b)(1), unless the authorization is terminated or revoked sooner.  Performed at [facility], [place] [address], [place], [place] [address]      Radiology Studies: DG Chest 1 View  Result Date: 09/11/2020 CLINICAL DATA:  History of pulmonary embolism. EXAM: CHEST  1 VIEW COMPARISON:  July 14, 2016. FINDINGS: The heart size and mediastinal contours are within normal limits. Both lungs are clear. The visualized skeletal structures are unremarkable. IMPRESSION: No active disease. Aortic Atherosclerosis (ICD10-I70.0). Electronically Signed   By: Marijo Conception M.D.   On: 09/11/2020 08:36   CT Head Wo Contrast  Result Date: 09/11/2020 CLINICAL DATA:  Altered mental status, sudden onset confusion EXAM: CT HEAD WITHOUT CONTRAST TECHNIQUE: Contiguous axial images were obtained from the base of the skull through the vertex without intravenous contrast. COMPARISON:  06/20/2020 FINDINGS: Brain: No evidence of acute infarction, hemorrhage, hydrocephalus, extra-axial collection, visible mass lesion or mass effect. Diffuse prominence of the ventricles, cisterns and sulci compatible with parenchymal volume loss and ex vacuo dilatation unchanged from priors. Patchy areas of white matter hypoattenuation are most compatible with moderate chronic microvascular angiopathy. Vascular: Atherosclerotic calcification of the carotid siphons and intradural vertebral arteries. No hyperdense vessel. Skull: No calvarial fracture  or suspicious osseous lesion. No scalp swelling or hematoma. Sinuses/Orbits: Paranasal sinuses and mastoid air cells are predominantly clear. Orbital structures are unremarkable aside from prior lens extractions. Other: None IMPRESSION: 1. No acute intracranial findings. If there is persisting concern for acute infarction, MRI is more sensitive and specific for early features of ischemia. 2. Stable parenchymal volume loss and chronic microvascular angiopathy. Electronically Signed   By: Lovena Le M.D.   On: 09/11/2020 03:08   NM Pulmonary Perfusion  Result Date: 09/11/2020 CLINICAL DATA:  Shortness of breath, elevated creatinine, elevated D-dimer, history hypertension, diabetes mellitus EXAM: NUCLEAR MEDICINE PERFUSION LUNG SCAN TECHNIQUE: Perfusion images were obtained  in multiple projections after intravenous injection of radiopharmaceutical. Ventilation scans intentionally deferred if perfusion scan and chest x-ray adequate for interpretation during COVID 19 epidemic. RADIOPHARMACEUTICALS:  3.9 mCi Tc-36mMAA IV COMPARISON:  Chest radiograph 09/11/2020 FINDINGS: Normal perfusion lung scan. Artifacts from arms on lateral view. No perfusion defects identified. IMPRESSION: Normal perfusion lung scan. Electronically Signed   By: MLavonia DanaM.D.   On: 09/11/2020 11:55   UKoreaAorta  Result Date: 09/10/2020 CLINICAL DATA:  87year old female with concern for aortic Disease. EXAM: ULTRASOUND OF ABDOMINAL AORTA TECHNIQUE: Ultrasound examination of the abdominal aorta and proximal common iliac arteries was performed to evaluate for aneurysm. Additional color and Doppler images of the distal aorta were obtained to document patency. COMPARISON:  CT abdomen pelvis dated 08/01/2020. FINDINGS: Evaluation is limited due to overlying bowel gas and inability of patient to cooperate with exam. There is atherosclerotic calcification of the visualized abdominal aorta. Abdominal aortic measurements as follows: Proximal:  2.9  x 3.0 cm Mid:  1.9 x 1.7 cm Distal: Not visualized due to bowel gas. Patent: Not evaluated. Right common iliac artery: Not visualized due to bowel gas. Left common iliac artery: Not visualized due to bowel gas. IMPRESSION: 1. Abdominal aortic aneurysm measuring 3 cm proximally. Recommend follow-up ultrasound every 3 years. This recommendation follows ACR consensus guidelines: White Paper of the ACR Incidental Findings Committee II on Vascular Findings. J Am Coll Radiol 2013; 10:789-794. 2.  Aortic Atherosclerosis (ICD10-I70.0). Electronically Signed   By: AAnner CreteM.D.   On: 09/10/2020 21:45    Scheduled Meds: . [START ON 09/12/2020] (feeding supplement) PROSource Plus  30 mL Oral Daily  . amLODipine  2.5 mg Oral Daily  . carvedilol  25 mg Oral BID WC  . cycloSPORINE  1 drop Both Eyes BID  . enoxaparin (LOVENOX) injection  30 mg Subcutaneous Q24H  . feeding supplement (GLUCERNA SHAKE)  237 mL Oral Q24H  . insulin aspart  0-9 Units Subcutaneous TID WC  . multivitamin with minerals  1 tablet Oral Daily  . pantoprazole  40 mg Oral Daily  . rosuvastatin  10 mg Oral Daily  . sodium chloride flush  3 mL Intravenous Q12H   Continuous Infusions: . sodium chloride 75 mL/hr at 09/11/20 1210     LOS: 0 days   SMarylu Lund MD [facility] Pager On Amion  If 7PM-7AM, please contact night-coverage 09/11/2020, 5:11 PM

## 2020-09-11 NOTE — Care Management Obs Status (Signed)
[facility] NOTIFICATION   Patient Details  Name: ALEXANDERA KUNTZMAN MRN: [id number] Date of Birth: 03-18-1934   Medicare Observation Status Notification Given:  Yes    MahabirJuliann Pulse, RN 09/11/2020, 4:20 PM

## 2020-09-11 NOTE — Progress Notes (Signed)
Initial Nutrition Assessment  DOCUMENTATION CODES:   Not applicable  INTERVENTION:  - will order Glucerna Shake once/day, each supplement provides 220 kcal and 10 grams of protein. - will order 30 ml Prosource Plus once/day, each supplement provides 100 kcal and 15 grams protein.  - will order 1 tablet multivitamin with minerals/day. - complete NFPE when feasible.  NUTRITION DIAGNOSIS:   Increased nutrient needs related to acute illness as evidenced by estimated needs.  GOAL:   Patient will meet greater than or equal to 90% of their needs  MONITOR:   PO intake,Supplement acceptance,Labs,Weight trends  REASON FOR ASSESSMENT:   Malnutrition Screening Tool  ASSESSMENT:   87 y.o. female with medical history of HTN, HLD, DM, and angioedema. She reported intermittent abdominal pain since 06/2020 or 07/2020. Patient reported improvement in pain when she eats and daughter reported that pepto-bismol seems to help. PTA she was experiencing intermittent diarrhea.  Patient remains out of the room to Nuclear Medicine. Heart Healthy/Carb Mod diet ordered last night at 2321 and no intake from breakfast this AM documented. Review of [facility] indicates that a breakfast tray was delivered today at ~1000.  Patient is Observation status.  She has not been seen by a [place] RD at any time in the past.   Weight yesterday was documented as 125 lb, and appears to be a stated weight. Weight on 3/7 was 120.5 lb and prior to that, most recently documented weight was on 01/02/19 when she weighed 153 lb.   Compared to current weight, this would indicate 28 lb weight loss (18.3% body weight) in the past 30 months; not significant for time frame.   No information documented in the edema section of flow sheet.   Labs reviewed; CBGs: 81 and 115 mg/dl, Cl: 112 mmol/l, creatinine: 2.34 mg/dl, GFR: 20 ml/min.  Medications reviewed; sliding scale novolog, 40 mg oral protonix/day. IVF; NS @ 75 ml/hr.     NUTRITION - FOCUSED PHYSICAL EXAM:  unable to complete at this time.  Diet Order:   Diet Order            Diet heart healthy/carb modified Room service appropriate? Yes; Fluid consistency: Thin  Diet effective now                 EDUCATION NEEDS:   Not appropriate for education at this time  Skin:  Skin Assessment: Reviewed RN Assessment  Last BM:  3/23 (PTA)  Height:   Ht Readings from Last 1 Encounters:  09/10/20 5\' 2"  (1.575 m)    Weight:   Wt Readings from Last 1 Encounters:  09/10/20 56.7 kg     Estimated Nutritional Needs:  Kcal:  1500-1700 kcal Protein:  70-80 grams Fluid:  >/= 1.6 L/day      Jarome Matin, MS, RD, LDN, CNSC Inpatient Clinical Dietitian RD pager # available in AMION  After hours/weekend pager # available in [facility]

## 2020-09-11 NOTE — Telephone Encounter (Signed)
-----  Message from Orson Slick, MD sent at 09/10/2020  7:10 AM EDT ----- Regarding: RE: Pt update Please have daughter collect as much urine as she can over a 24 hour period. Even if we miss some at night it can still be helpful.  OK to receive denosumab on 09/16/2020. As a matter of fact we will be increasing the frequency of this medication to help protect her bones from the multiple myeloma.   We can discuss this further with her on 09/15/2020. Please have them collect the urine and turn it into the lab that day.  ----- Message ----- From: Veverly Fells, RN Sent: 09/08/2020   2:29 PM EDT To: Orson Slick, MD, [facility] Subject: Pt update                                      Hi Dr. Lorenso Courier.  This pt's daughter, [name], called to give you an update before her next appointment on 09/15/20.  -Multiple failed attempts at getting a 24-hour urine collected. Pt refuses to collect urine at night when daughter isn't present.  -Currently gets Prolia injections every 6 months. Next one is is scheduled on 09/16/20. Should they continue this?   I informed [name] that I would pass this information to you.   Thanks

## 2020-09-11 NOTE — TOC Initial Note (Signed)
Transition of Care [facility]) - Initial/Assessment Note    Patient Details  Name: Madeline Burke MRN: [id number] Date of Birth: 16-May-1934  [facility] CM/SW Contact:    Madeline Phi, RN Phone Number: 09/11/2020, 4:18 PM  Clinical Narrative: Damaris Schooner to dtr Madeline Amato d/c plan home.               Expected Discharge Plan: Home/Self Care Barriers to Discharge: Continued Medical Work up   Patient Goals and CMS Choice Patient states their goals for this hospitalization and ongoing recovery are:: go home CMS Medicare.gov Compare Post Acute Care list provided to:: Patient Represenative (must comment) Madeline Burke [phone]) Choice offered to / list presented to : Adult Children  Expected Discharge Plan and Services Expected Discharge Plan: Home/Self Care   Discharge Planning Services: CM Consult   Living arrangements for the past 2 months: Single Family Home                                      Prior Living Arrangements/Services Living arrangements for the past 2 months: Single Family Home Lives with:: Self Patient language and need for interpreter reviewed:: Yes        Need for Family Participation in Patient Care: No (Comment) Care giver support system in place?: Yes (comment) Current home services: DME (cane) Criminal Activity/Legal Involvement Pertinent to Current Situation/Hospitalization: No - Comment as needed  Activities of Daily Living      Permission Sought/Granted                  Emotional Assessment Appearance:: Appears stated age Attitude/Demeanor/Rapport: Gracious Affect (typically observed): Accepting Orientation: : Oriented to Self,Oriented to Place,Oriented to  Time,Oriented to Situation Alcohol / Substance Use: Not Applicable Psych Involvement: No (comment)  Admission diagnosis:  Syncope and collapse [R55] Epigastric pain [R10.13] AKI (acute kidney injury) ([place]) [N17.9] Patient Active Problem List   Diagnosis Date Noted   . AKI (acute kidney injury) ([place]) 09/10/2020  . Abdominal pain, epigastric 09/10/2020  . HTN (hypertension) 09/10/2020  . Diabetes ([place]) 09/10/2020  . Anemia 09/10/2020  . Angio-edema 02/08/2018   PCP:  Madeline Cha, MD Pharmacy:   [id number] - [place], [place] [address] Phone: [phone] Fax: [phone]     Social Determinants of Health (SDOH) Interventions    Readmission Risk Interventions No flowsheet data found.

## 2020-09-11 NOTE — ED Notes (Signed)
Patient arrived back from CT scan, daughter at bedside states pt now interacting with her more and seems to be returning back to baseline. Pt continues not to interact with RN, nods yes or no without speaking.

## 2020-09-11 NOTE — Plan of Care (Signed)

## 2020-09-12 ENCOUNTER — Inpatient Hospital Stay: Payer: Medicare Other

## 2020-09-12 ENCOUNTER — Inpatient Hospital Stay (HOSPITAL_COMMUNITY): Payer: Medicare Other

## 2020-09-12 ENCOUNTER — Other Ambulatory Visit: Payer: Self-pay | Admitting: Hematology and Oncology

## 2020-09-12 DIAGNOSIS — N179 Acute kidney failure, unspecified: Secondary | ICD-10-CM

## 2020-09-12 DIAGNOSIS — N178 Other acute kidney failure: Secondary | ICD-10-CM

## 2020-09-12 DIAGNOSIS — C9000 Multiple myeloma not having achieved remission: Secondary | ICD-10-CM | POA: Insufficient documentation

## 2020-09-12 LAB — COMPREHENSIVE METABOLIC PANEL
ALT: 12 U/L (ref 0–44)
AST: 19 U/L (ref 15–41)
Albumin: 3.6 g/dL (ref 3.5–5.0)
Alkaline Phosphatase: 47 U/L (ref 38–126)
Anion gap: 10 (ref 5–15)
BUN: 18 mg/dL (ref 8–23)
CO2: 19 mmol/L — ABNORMAL LOW (ref 22–32)
Calcium: 8.4 mg/dL — ABNORMAL LOW (ref 8.9–10.3)
Chloride: 112 mmol/L — ABNORMAL HIGH (ref 98–111)
Creatinine, Ser: 2.32 mg/dL — ABNORMAL HIGH (ref 0.44–1.00)
GFR, Estimated: 20 mL/min — ABNORMAL LOW (ref >60)
Glucose, Bld: 66 mg/dL — ABNORMAL LOW (ref 70–99)
Potassium: 4.2 mmol/L (ref 3.5–5.1)
Sodium: 141 mmol/L (ref 135–145)
Total Bilirubin: 0.7 mg/dL (ref 0.3–1.2)
Total Protein: 6.2 g/dL — ABNORMAL LOW (ref 6.5–8.1)

## 2020-09-12 LAB — UPEP/UIFE/LIGHT CHAINS/TP, 24-HR UR
% BETA, Urine: 1.8 %
ALPHA 1 URINE: 0.2 %
Albumin, U: 2.3 %
Alpha 2, Urine: 0.7 %
Free Kappa/Lambda Ratio: 5121.94 — ABNORMAL HIGH (ref 1.83–14.26)
Free Lambda Lt Chains,Ur: 9.49 mg/L (ref 0.27–15.21)
GAMMA GLOBULIN URINE: 95.1 %
M-SPIKE %, Urine: 92.6 % — ABNORMAL HIGH
M-Spike, Mg/24 Hr: 2992 mg/24 hr — ABNORMAL HIGH
Total Protein, Urine-Ur/day: 3231 mg/24 hr — ABNORMAL HIGH (ref 30–150)
Total Protein, Urine: 1615.4 mg/dL
Total Volume: 200

## 2020-09-12 LAB — CBC
HCT: 24.8 % — ABNORMAL LOW (ref 36.0–46.0)
Hemoglobin: 7.7 g/dL — ABNORMAL LOW (ref 12.0–15.0)
MCH: 28.4 pg (ref 26.0–34.0)
MCHC: 31 g/dL (ref 30.0–36.0)
MCV: 91.5 fL (ref 80.0–100.0)
Platelets: 116 10*3/uL — ABNORMAL LOW (ref 150–400)
RBC: 2.71 MIL/uL — ABNORMAL LOW (ref 3.87–5.11)
RDW: 18.1 % — ABNORMAL HIGH (ref 11.5–15.5)
WBC: 4.5 10*3/uL (ref 4.0–10.5)
nRBC: 0 % (ref 0.0–0.2)

## 2020-09-12 LAB — GLUCOSE, CAPILLARY
Glucose-Capillary: 61 mg/dL — ABNORMAL LOW (ref 70–99)
Glucose-Capillary: 91 mg/dL (ref 70–99)
Glucose-Capillary: 163 mg/dL — ABNORMAL HIGH (ref 70–99)
Glucose-Capillary: 116 mg/dL — ABNORMAL HIGH (ref 70–99)
Glucose-Capillary: 292 mg/dL — ABNORMAL HIGH (ref 70–99)

## 2020-09-12 IMAGING — US US RENAL
1 series · 14 of 23 positions shown · non-contrast
Comparison: CT [DATE]

CLINICAL DATA: Acute renal failure

EXAM:
RENAL / URINARY TRACT ULTRASOUND COMPLETE

[Series 1: us renal · 14 of 23 slices shown]
[im 1/23]
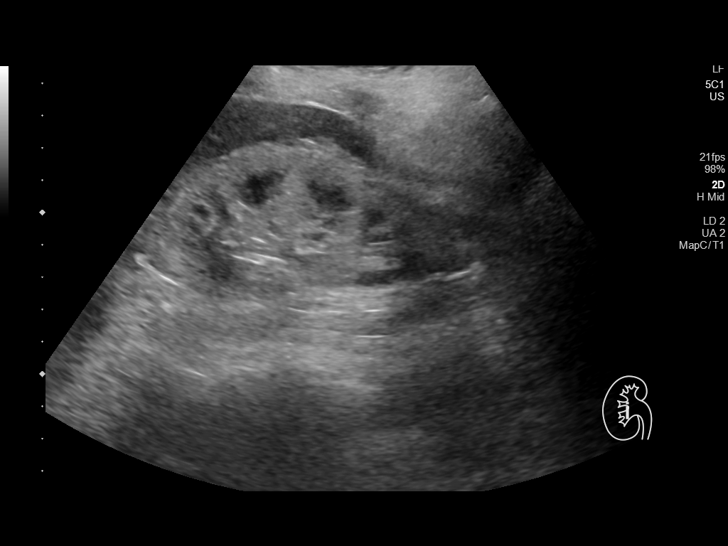
[im 3/23]
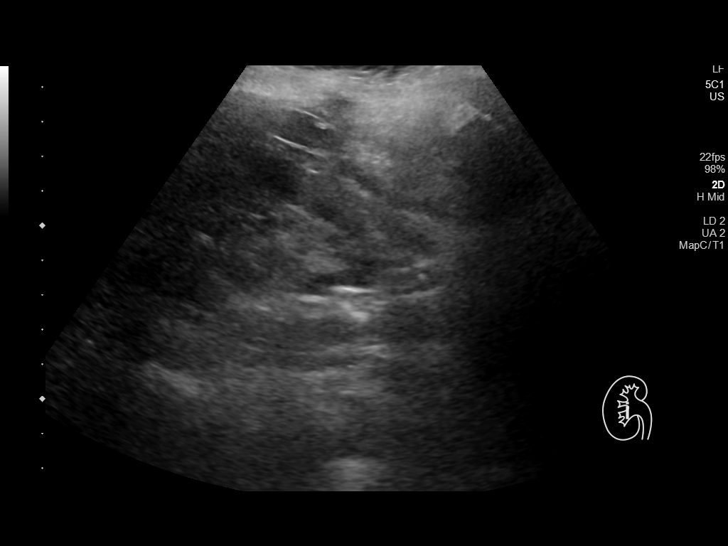
[im 5/23]
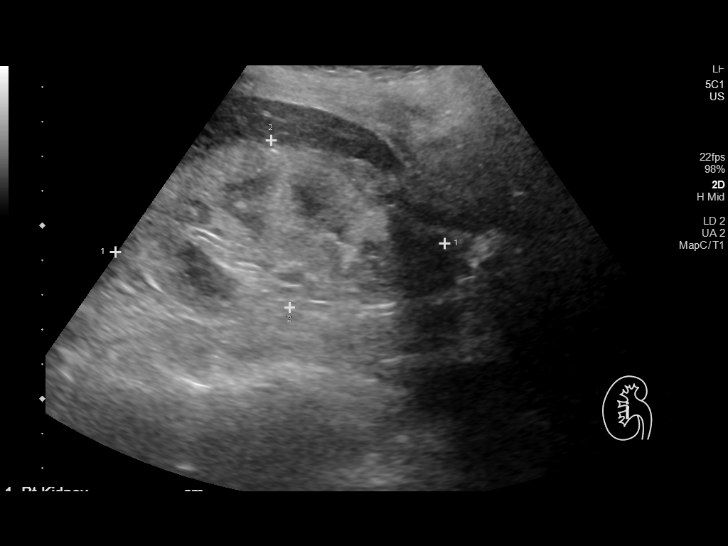
[im 6/23]
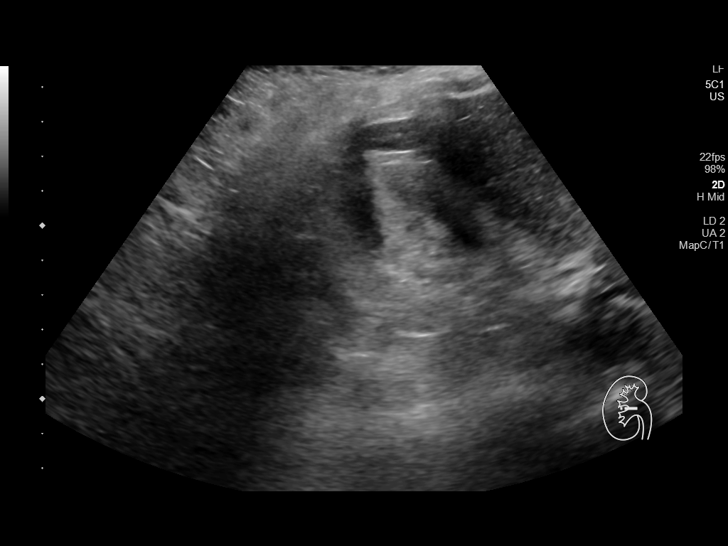
[im 8/23]
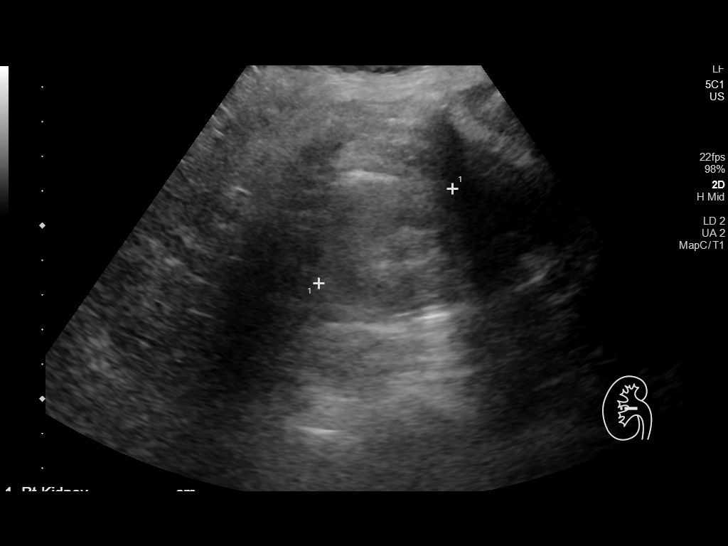
[im 10/23]
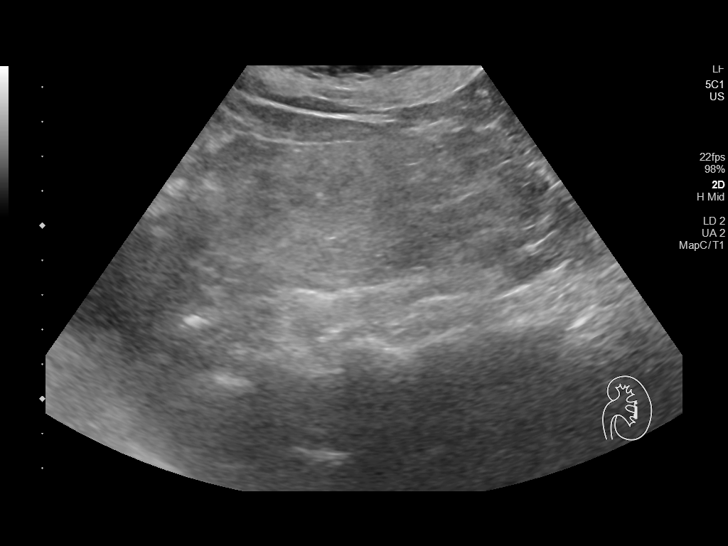
[im 11/23]
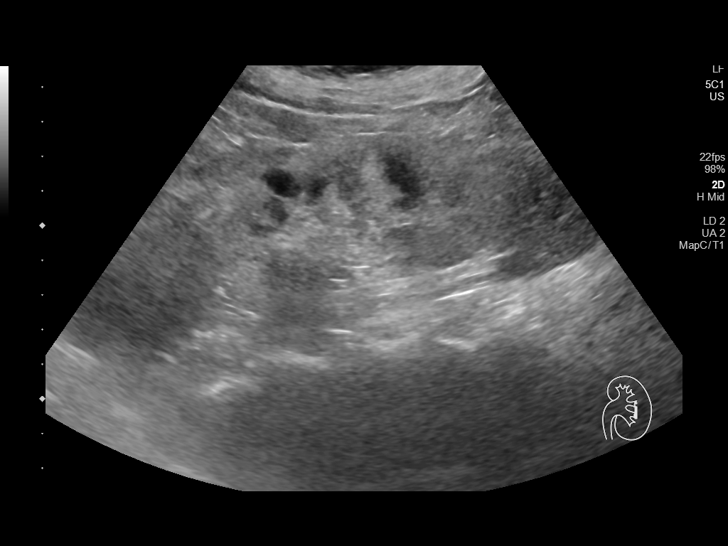
[im 13/23]
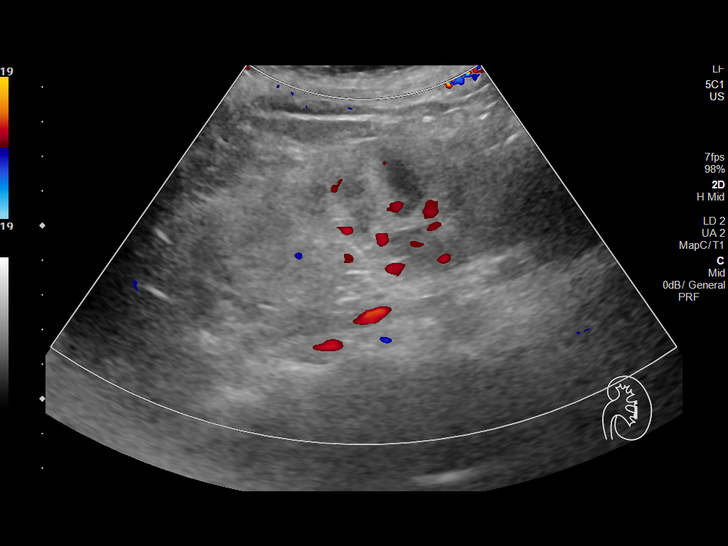
[im 14/23]
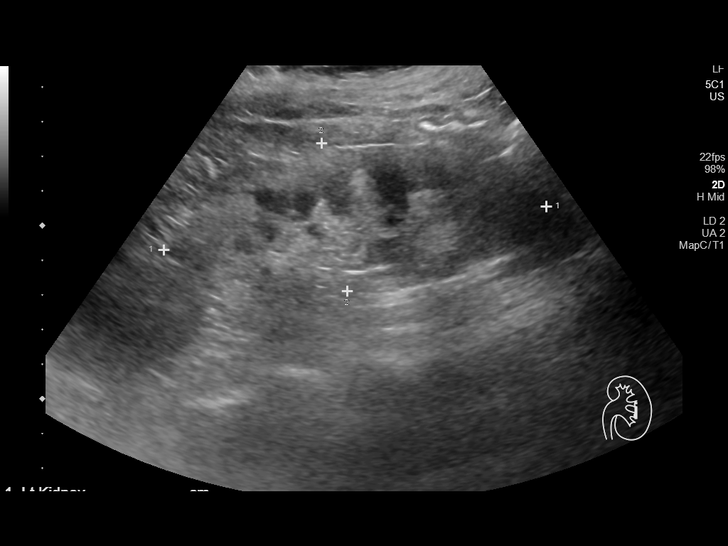
[im 16/23]
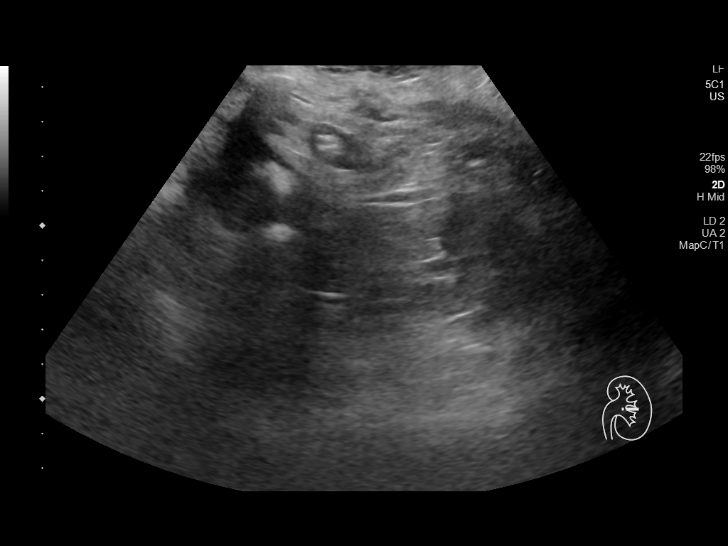
[im 18/23]
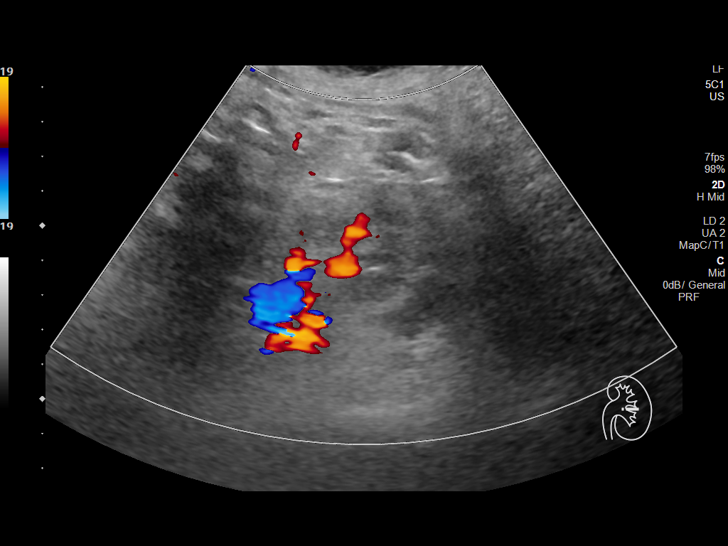
[im 19/23]
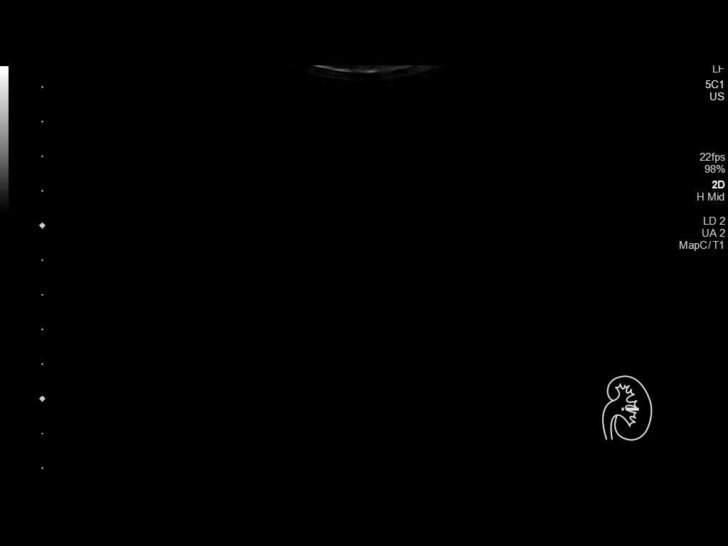
[im 21/23]
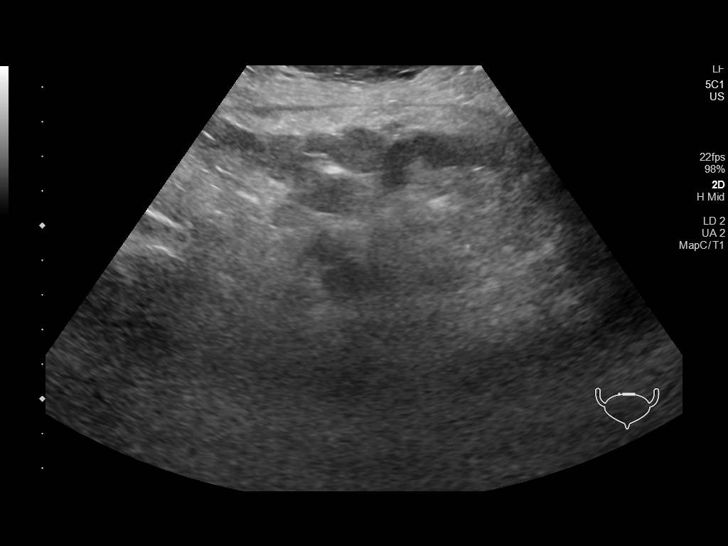
[im 23/23]
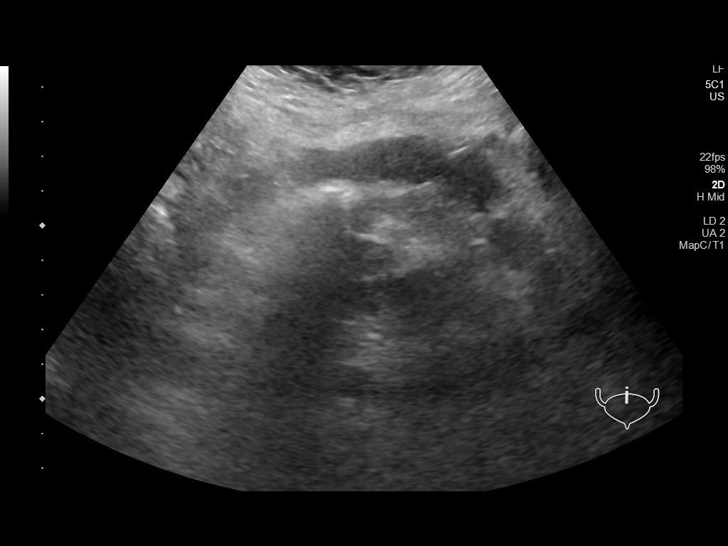

[14 of 23 positions shown; findings below may reference images not displayed]

FINDINGS: Right Kidney:

Renal measurements: 9.5 x 4.9 x 4.7 cm = volume: 114 mL. Slightly
increased echogenicity of the renal parenchyma. No hydronephrosis.

Left Kidney:

Renal measurements: 11.1 x 4.3 x 4.4 cm = volume: 108 mL. Slightly
increased echogenicity of the renal parenchyma. No hydronephrosis.

Bladder:

Recent voiding.  No visible urine in the bladder presently.

Other:

Arterial and venous flow present to each kidney.
IMPRESSION: No obstruction. Kidneys within normal limits in size for age.
Echogenic renal parenchyma consistent with renal parenchymal
disease.

## 2020-09-12 MED ORDER — PROCHLORPERAZINE MALEATE 10 MG PO TABS
10.0000 mg | ORAL_TABLET | Freq: Once | ORAL | Status: AC
  Administered 2020-09-12: 10 mg via ORAL
  Filled 2020-09-12: qty 1

## 2020-09-12 MED ORDER — DEXAMETHASONE SODIUM PHOSPHATE 10 MG/ML IJ SOLN
10.0000 mg | INTRAMUSCULAR | Status: DC
  Administered 2020-09-12 – 2020-09-19 (×2): 10 mg via INTRAVENOUS
  Filled 2020-09-12 (×2): qty 1

## 2020-09-12 MED ORDER — LIDOCAINE-PRILOCAINE 2.5-2.5 % EX CREA: TOPICAL_CREAM | CUTANEOUS | 3 refills | Status: DC

## 2020-09-12 MED ORDER — BORTEZOMIB CHEMO SQ INJECTION 3.5 MG (2.5MG/ML)
1.3000 mg/m2 | Freq: Once | INTRAMUSCULAR | Status: AC
  Administered 2020-09-12: 2 mg via SUBCUTANEOUS
  Filled 2020-09-12: qty 0.8

## 2020-09-12 MED ORDER — ONDANSETRON HCL 8 MG PO TABS: 8.0000 mg | ORAL_TABLET | Freq: Two times a day (BID) | ORAL | 1 refills | Status: DC | PRN

## 2020-09-12 MED ORDER — GLUCERNA SHAKE PO LIQD
237.0000 mL | ORAL | Status: DC
  Administered 2020-09-13 – 2020-09-16 (×4): 237 mL via ORAL
  Filled 2020-09-12 (×3): qty 237

## 2020-09-12 MED ORDER — ACYCLOVIR 200 MG PO CAPS
200.0000 mg | ORAL_CAPSULE | Freq: Two times a day (BID) | ORAL | Status: DC
  Administered 2020-09-12 – 2020-09-19 (×12): 200 mg via ORAL
  Filled 2020-09-12 (×16): qty 1

## 2020-09-12 MED ORDER — PROCHLORPERAZINE MALEATE 10 MG PO TABS: 10.0000 mg | ORAL_TABLET | Freq: Four times a day (QID) | ORAL | 1 refills | Status: DC | PRN

## 2020-09-12 MED ORDER — ACYCLOVIR 400 MG PO TABS: 400.0000 mg | ORAL_TABLET | Freq: Two times a day (BID) | ORAL | 5 refills | Status: DC

## 2020-09-12 MED ORDER — PROSOURCE PLUS PO LIQD
30.0000 mL | Freq: Two times a day (BID) | ORAL | Status: DC
  Administered 2020-09-12 – 2020-09-17 (×7): 30 mL via ORAL
  Filled 2020-09-12 (×6): qty 30

## 2020-09-12 NOTE — Evaluation (Signed)
Physical Therapy Evaluation Patient Details Name: MITSUKO LUERA MRN: [id number] DOB: [birth year] Today's Date: 09/12/2020   History of Present Illness  Madeline Burke is a 87 y.o. female with medical history significant of hypertension, hyperlipidemia, diabetes, angioedema, currently being worked up for elevated immunoglobulin levels who presents with abdominal pain.Patient has had some intermittent abdominal pain for months, first starting in January or February. There was a CT scan in February which did not show exhalation for the pain. Patient found to have Multiple myeloma with renal failure. Continues to have epigastric pain of unknown origin.  Clinical Impression  Pt admitted with above diagnosis.  Pt currently with functional limitations due to the deficits listed below (see PT Problem List). Pt will benefit from skilled PT to increase their independence and safety with mobility to allow discharge to the venue listed below.  Pt agreeable to mobilize.  Pt typically uses SPC at home and doesn't leave the house (stairs) unless accompanied.  Pt ambulated in hallway and recommend pt use RW upon d/c for safety.      Follow Up Recommendations Home health PT;Supervision for mobility/OOB    Equipment Recommendations  None recommended by PT    Recommendations for Other Services       Precautions / Restrictions Precautions Precautions: Fall Restrictions Weight Bearing Restrictions: No      Mobility  Bed Mobility Overal bed mobility: Needs Assistance Bed Mobility: Supine to Sit     Supine to sit: Min assist;HOB elevated     General bed mobility comments: pt in recliner    Transfers Overall transfer level: Needs assistance Equipment used: Rolling walker (2 wheeled) Transfers: Sit to/from Stand Sit to Stand: Min assist Stand pivot transfers: Min assist       General transfer comment: slight assist to rise, cues for hand placement to also self  assist  Ambulation/Gait Ambulation/Gait assistance: Min guard Gait Distance (Feet): 160 Feet Assistive device: Rolling walker (2 wheeled) Gait Pattern/deviations: Step-through pattern;Decreased stride length     General Gait Details: slow pace but steady with RW, pt denies any dizziness; distance to tolerance  Stairs            Wheelchair Mobility    Modified Rankin (Stroke Patients Only)       Balance Overall balance assessment: History of Falls                                           Pertinent Vitals/Pain Pain Assessment: Faces Faces Pain Scale: Hurts little more Pain Location: epigastric Pain Descriptors / Indicators: Grimacing;Sharp;Burning Pain Intervention(s): Repositioned;Monitored during session    [place] expects to be discharged to:: Private residence Living Arrangements: Alone Available Help at Discharge: Family;Available PRN/intermittently Type of Home: House Home Access: Stairs to enter Entrance Stairs-Rails: Psychiatric nurse of Steps: 5 Home Layout: One level Home Equipment: Toilet riser;Walker - 4 wheels;Walker - 2 wheels;Cane - single point Additional Comments: Predominantly does sink bath at home.    Prior Function Level of Independence: Independent with assistive device(s)               Hand Dominance   Dominant Hand: Right    Extremity/Trunk Assessment   Upper Extremity Assessment Upper Extremity Assessment: Generalized weakness    Lower Extremity Assessment Lower Extremity Assessment: Generalized weakness    Cervical / Trunk Assessment Cervical / Trunk Assessment: Kyphotic  Communication  Communication: HOH  Cognition Arousal/Alertness: Awake/alert Behavior During Therapy: WFL for tasks assessed/performed Overall Cognitive Status: Within Functional Limits for tasks assessed                                        General Comments       Exercises     Assessment/Plan    PT Assessment Patient needs continued PT services  PT Problem List Decreased strength;Decreased mobility;Decreased activity tolerance;Decreased knowledge of use of DME;Decreased balance       PT Treatment Interventions Gait training;DME instruction;Therapeutic exercise;Functional mobility training;Therapeutic activities;Patient/family education;Balance training;Stair training    PT Goals (Current goals can be found in the Care Plan section)  Acute Rehab PT Goals Patient Stated Goal: to go home PT Goal Formulation: With patient/family Time For Goal Achievement: 09/26/20 Potential to Achieve Goals: Good    Frequency Min 3X/week   Barriers to discharge        Co-evaluation               AM-PAC PT "6 Clicks" Mobility  Outcome Measure Help needed turning from your back to your side while in a flat bed without using bedrails?: A Little Help needed moving from lying on your back to sitting on the side of a flat bed without using bedrails?: A Little Help needed moving to and from a bed to a chair (including a wheelchair)?: A Little Help needed standing up from a chair using your arms (e.g., wheelchair or bedside chair)?: A Little Help needed to walk in hospital room?: A Little Help needed climbing 3-5 steps with a railing? : A Little 6 Click Score: 18    End of Session Equipment Utilized During Treatment: Gait belt Activity Tolerance: Patient tolerated treatment well Patient left: in chair;with call bell/phone within reach;with family/visitor present Nurse Communication: Mobility status PT Visit Diagnosis: Difficulty in walking, not elsewhere classified (R26.2)    Time: 5701-7793 PT Time Calculation (min) (ACUTE ONLY): 21 min   Charges:   PT Evaluation $PT Eval Low Complexity: 1 Low     Kati PT, DPT Acute Rehabilitation Services Pager: [phone] Office: [phone] [place] Ram E 09/12/2020, 1:31 PM

## 2020-09-12 NOTE — Progress Notes (Signed)
PROGRESS NOTE    Madeline Burke  CBJ:[id number] DOB: [birth year] DOA: 09/10/2020 PCP: Leeroy Cha, MD    Brief Narrative:  87 y.o. female with medical history significant of hypertension, hyperlipidemia, diabetes, angioedema, currently being worked up for elevated immunoglobulin levels who presents with abdominal pain.             Patient has had some intermittent abdominal pain for months, first starting in January or February. There was a CT scan in February which did not show exhalation for the pain.             She was noted to have the pain during her oncology visit in March and it was believed not to be related to her elevated immunoglobulin levels and her work-up from their perspective.             She is continued to have waxing and waning abdominal pain.  The pain is difficult to describe but is epigastric per patient.  She states that it does seem to improve with food and her daughter states that it does seem to improve whenever she is taken Pepto-Bismol for diarrhea.  They have not noticed any other aggravating or alleviating factors. In the ED, pt was found to have evidence of ARF with Cr of 2.5, up from 1  Assessment & Plan:   Principal Problem:   AKI (acute kidney injury) ([place]) Active Problems:   Abdominal pain, epigastric   HTN (hypertension)   Diabetes ([place])   Anemia   ARF (acute renal failure) ([place])   Acute renal failure > Creatinine elevated to 2.5 up from 1.92 weeks ago which is up from 1 previous to that. > This is in the setting of her current work-up for elevated immunoglobulin levels which may be due to multiple myeloma, she is following with heme-onc for this. - Continued with IVF hydration as tolerated - Avoid nephrotoxic agents -despite IVF, no significant change in renal function was noted -Appreciate input by Hematology. Concerns for acute renal failure  - repeat bmet in AM  Abdominal pain > Patient has had some previous episodes abdominal  pain the last few months.  In her evaluation by heme-onc they did not believe this is related to that process in beginning of this month. > CT in February did not show explanation for the pain. > Pain has improved with pain medication in the ED > Ultrasound evaluation of aorta showed only 3 cm AAA with recommendation for 3-year follow-up. > Normal lipase > Given other negative findings and epigastric pain that is relieved with food and Pepto-Bismol.  Concern is for peptic ulcer or other gastritis. - Family reports prior GI workup, notably EGD, by [facility] as being unremarkable - continued on PPI - Continue supportive care for now  Syncope > Patient has reportedly had a couple episodes of syncope in the setting of her severe pain. > Suspect possible vasovagal or neurocardiogenic given the setting around her pain. - cont to monitor on telemetry - VQ scan ordered in ED given elevated D-dimer, reviewed and is low prob for PE -Cont supportive care  Elevated immunoglobulin level > Undergoing work-up outpatient for this with hematology/oncology - Appreciate input by Hematology -Recommendation to initiate steroids and Velcade -Bone marrow biopsy reviewed, findings c/w with myeloma  Anemia > Gradually worsening anemia in the setting of her work-up for her elevated immunoglobulin level. > Hemoglobin currently 8.6 which is stable from the last couple of days.  However her hemoglobin was  11 2 months ago. - Repeat cbc in AM  Hypertension - Continue home Coreg and amlodipine - BP stable at present  Hyperlipidemia - Continue home rosuvastatin as tolerated  Diabetes - continue SSI coverage while in hosptial - glucose trends are stable  DVT prophylaxis: Lovenox subq Code Status: Full Family Communication: Pt in room, family at bedside  Status is: Inpatient  Pt requires continued inpatient stay because:: IV treatments appropriate due to intensity of illness or inability to take PO  and Inpatient level of care appropriate due to severity of illness  Dispo: The patient is from: Home              Anticipated d/c is to: Home              Patient currently is not medically stable to d/c.   Difficult to place patient No   Consultants:   Hematology  Procedures:     Antimicrobials: Anti-infectives (From admission, onward)   Start     Dose/Rate Route Frequency Ordered Stop   09/12/20 2200  acyclovir (ZOVIRAX) 200 MG capsule 200 mg        200 mg Oral 2 times daily 09/12/20 1356        Subjective: Reports feeling better today. Does not feel hungry  Objective: Vitals:   09/11/20 1436 09/11/20 2115 09/12/20 0518 09/12/20 1350  BP: 127/64 (!) 139/52 (!) 160/62 (!) 137/54  Pulse: (!) 59 (!) 57 63 (!) 58  Resp: 18 20 18 20   Temp: 98.2 F (36.8 C) 98 F (36.7 C)  97.7 F (36.5 C)  TempSrc: Oral Oral  Oral  SpO2: 100% 99% 98% 97%  Weight:      Height:        Intake/Output Summary (Last 24 hours) at 09/12/2020 1626 Last data filed at 09/12/2020 1356 Gross per 24 hour  Intake 1197.26 ml  Output 750 ml  Net 447.26 ml   Filed Weights   09/10/20 1829  Weight: 56.7 kg    Examination: General exam: Awake, laying in bed, in nad Respiratory system: Normal respiratory effort, no wheezing Cardiovascular system: regular rate, s1, s2 Gastrointestinal system: Soft, nondistended, positive BS Central nervous system: CN2-12 grossly intact, strength intact Extremities: Perfused, no clubbing Skin: Normal skin turgor, no notable skin lesions seen Psychiatry: Mood normal // no visual hallucinations   Data Reviewed: I have personally reviewed following labs and imaging studies  CBC: Recent Labs  Lab 09/08/20 0800 09/10/20 1910 09/11/20 0527 09/12/20 0521  WBC 5.5 7.1 6.4 4.5  NEUTROABS 1.3* 1.8  --   --   HGB 8.8* 8.6* 8.8* 7.7*  HCT 28.1* 26.7* 27.4* 24.8*  MCV 89.8 89.9 88.7 91.5  PLT 148* 139* 126* 161*   Basic Metabolic Panel: Recent Labs  Lab  09/10/20 1910 09/11/20 0527 09/12/20 0521  NA 144 142 141  K 4.6 4.1 4.2  CL 111 112* 112*  CO2 21* 21* 19*  GLUCOSE 109* 81 66*  BUN 25* 22 18  CREATININE 2.57* 2.34* 2.32*  CALCIUM 9.6 9.1 8.4*   GFR: Estimated Creatinine Clearance: 14 mL/min (A) (by C-G formula based on SCr of 2.32 mg/dL (H)). Liver Function Tests: Recent Labs  Lab 09/10/20 1910 09/12/20 0521  AST 24 19  ALT 13 12  ALKPHOS 58 47  BILITOT 0.3 0.7  PROT 7.4 6.2*  ALBUMIN 4.3 3.6   Recent Labs  Lab 09/10/20 1910  LIPASE 51   No results for input(s): AMMONIA in the last 168  hours. Coagulation Profile: No results for input(s): INR, PROTIME in the last 168 hours. Cardiac Enzymes: No results for input(s): CKTOTAL, CKMB, CKMBINDEX, TROPONINI in the last 168 hours. BNP (last 3 results) No results for input(s): PROBNP in the last 8760 hours. HbA1C: No results for input(s): HGBA1C in the last 72 hours. CBG: Recent Labs  Lab 09/11/20 2112 09/12/20 0716 09/12/20 0748 09/12/20 1134 09/12/20 1622  GLUCAP 70 61* 91 163* 116*   Lipid Profile: No results for input(s): CHOL, HDL, LDLCALC, TRIG, CHOLHDL, LDLDIRECT in the last 72 hours. Thyroid Function Tests: No results for input(s): TSH, T4TOTAL, FREET4, T3FREE, THYROIDAB in the last 72 hours. Anemia Panel: No results for input(s): VITAMINB12, FOLATE, FERRITIN, TIBC, IRON, RETICCTPCT in the last 72 hours. Sepsis Labs: No results for input(s): PROCALCITON, LATICACIDVEN in the last 168 hours.  Recent Results (from the past 240 hour(s))  SARS CORONAVIRUS 2 (TAT 6-24 HRS) Nasopharyngeal Nasopharyngeal Swab     Status: None   Collection Time: 09/10/20 10:42 PM   Specimen: Nasopharyngeal Swab  Result Value Ref Range Status   SARS Coronavirus 2 NEGATIVE NEGATIVE Final    Comment: (NOTE) SARS-CoV-2 target nucleic acids are NOT DETECTED.  The SARS-CoV-2 RNA is generally detectable in upper and lower respiratory specimens during the acute phase of  infection. Negative results do not preclude SARS-CoV-2 infection, do not rule out co-infections with other pathogens, and should not be used as the sole basis for treatment or other patient management decisions. Negative results must be combined with clinical observations, patient history, and epidemiological information. The expected result is Negative.  Fact Sheet for Patients: SugarRoll.be  Fact Sheet for Healthcare Providers: https://www.woods-mathews.com/  This test is not yet approved or cleared by the [place] FDA and  has been authorized for detection and/or diagnosis of SARS-CoV-2 by FDA under an Emergency Use Authorization (EUA). This EUA will remain  in effect (meaning this test can be used) for the duration of the COVID-19 declaration under Se ction 564(b)(1) of the Act, 21 U.S.C. section 360bbb-3(b)(1), unless the authorization is terminated or revoked sooner.  Performed at [facility], [place] [address], [place], [place] [address]      Radiology Studies: DG Chest 1 View  Result Date: 09/11/2020 CLINICAL DATA:  History of pulmonary embolism. EXAM: CHEST  1 VIEW COMPARISON:  July 14, 2016. FINDINGS: The heart size and mediastinal contours are within normal limits. Both lungs are clear. The visualized skeletal structures are unremarkable. IMPRESSION: No active disease. Aortic Atherosclerosis (ICD10-I70.0). Electronically Signed   By: Marijo Conception M.D.   On: 09/11/2020 08:36   CT Head Wo Contrast  Result Date: 09/11/2020 CLINICAL DATA:  Altered mental status, sudden onset confusion EXAM: CT HEAD WITHOUT CONTRAST TECHNIQUE: Contiguous axial images were obtained from the base of the skull through the vertex without intravenous contrast. COMPARISON:  06/20/2020 FINDINGS: Brain: No evidence of acute infarction, hemorrhage, hydrocephalus, extra-axial collection, visible mass lesion or mass effect. Diffuse prominence of the  ventricles, cisterns and sulci compatible with parenchymal volume loss and ex vacuo dilatation unchanged from priors. Patchy areas of white matter hypoattenuation are most compatible with moderate chronic microvascular angiopathy. Vascular: Atherosclerotic calcification of the carotid siphons and intradural vertebral arteries. No hyperdense vessel. Skull: No calvarial fracture or suspicious osseous lesion. No scalp swelling or hematoma. Sinuses/Orbits: Paranasal sinuses and mastoid air cells are predominantly clear. Orbital structures are unremarkable aside from prior lens extractions. Other: None IMPRESSION: 1. No acute intracranial findings. If there  is persisting concern for acute infarction, MRI is more sensitive and specific for early features of ischemia. 2. Stable parenchymal volume loss and chronic microvascular angiopathy. Electronically Signed   By: Lovena Le M.D.   On: 09/11/2020 03:08   NM Pulmonary Perfusion  Result Date: 09/11/2020 CLINICAL DATA:  Shortness of breath, elevated creatinine, elevated D-dimer, history hypertension, diabetes mellitus EXAM: NUCLEAR MEDICINE PERFUSION LUNG SCAN TECHNIQUE: Perfusion images were obtained in multiple projections after intravenous injection of radiopharmaceutical. Ventilation scans intentionally deferred if perfusion scan and chest x-ray adequate for interpretation during COVID 19 epidemic. RADIOPHARMACEUTICALS:  3.9 mCi Tc-75mMAA IV COMPARISON:  Chest radiograph 09/11/2020 FINDINGS: Normal perfusion lung scan. Artifacts from arms on lateral view. No perfusion defects identified. IMPRESSION: Normal perfusion lung scan. Electronically Signed   By: MLavonia DanaM.D.   On: 09/11/2020 11:55   [place]  Result Date: 09/12/2020 CLINICAL DATA:  Acute renal failure EXAM: RENAL / URINARY TRACT ULTRASOUND COMPLETE COMPARISON:  CT 08/01/2020 FINDINGS: Right Kidney: Renal measurements: 9.5 x 4.9 x 4.7 cm = volume: 114 mL. Slightly increased echogenicity of the  renal parenchyma. No hydronephrosis. Left Kidney: Renal measurements: 11.1 x 4.3 x 4.4 cm = volume: 108 mL. Slightly increased echogenicity of the renal parenchyma. No hydronephrosis. Bladder: Recent voiding.  No visible urine in the bladder presently. Other: Arterial and venous flow present to each kidney. IMPRESSION: No obstruction. Kidneys within normal limits in size for age. Echogenic renal parenchyma consistent with renal parenchymal disease. Electronically Signed   By: MNelson ChimesM.D.   On: 09/12/2020 12:57   [place]  Result Date: 09/10/2020 CLINICAL DATA:  87year old female with concern for aortic Disease. EXAM: ULTRASOUND OF ABDOMINAL AORTA TECHNIQUE: Ultrasound examination of the abdominal aorta and proximal common iliac arteries was performed to evaluate for aneurysm. Additional color and Doppler images of the distal aorta were obtained to document patency. COMPARISON:  CT abdomen pelvis dated 08/01/2020. FINDINGS: Evaluation is limited due to overlying bowel gas and inability of patient to cooperate with exam. There is atherosclerotic calcification of the visualized abdominal aorta. Abdominal aortic measurements as follows: Proximal:  2.9 x 3.0 cm Mid:  1.9 x 1.7 cm Distal: Not visualized due to bowel gas. Patent: Not evaluated. Right common iliac artery: Not visualized due to bowel gas. Left common iliac artery: Not visualized due to bowel gas. IMPRESSION: 1. Abdominal aortic aneurysm measuring 3 cm proximally. Recommend follow-up ultrasound every 3 years. This recommendation follows ACR consensus guidelines: White Paper of the ACR Incidental Findings Committee II on Vascular Findings. J Am Coll Radiol 2013; 10:789-794. 2.  Aortic Atherosclerosis (ICD10-I70.0). Electronically Signed   By: AAnner CreteM.D.   On: 09/10/2020 21:45    Scheduled Meds: . (feeding supplement) PROSource Plus  30 mL Oral BID BM  . acyclovir  200 mg Oral BID  . amLODipine  2.5 mg Oral Daily  . carvedilol  25  mg Oral BID WC  . cycloSPORINE  1 drop Both Eyes BID  . dexamethasone (DECADRON) injection  10 mg Intravenous Weekly  . enoxaparin (LOVENOX) injection  30 mg Subcutaneous Q24H  . [START ON 09/13/2020] feeding supplement (GLUCERNA SHAKE)  237 mL Oral Q24H  . insulin aspart  0-9 Units Subcutaneous TID WC  . multivitamin with minerals  1 tablet Oral Daily  . pantoprazole  40 mg Oral Daily  . rosuvastatin  10 mg Oral Daily  . sodium chloride flush  3 mL Intravenous Q12H   Continuous Infusions: .  sodium chloride 75 mL/hr at 09/12/20 1458     LOS: 1 day   Marylu Lund, MD [facility] Pager On Amion  If 7PM-7AM, please contact night-coverage 09/12/2020, 4:26 PM

## 2020-09-12 NOTE — Progress Notes (Signed)
Multiple myeloma with renal failure: Not responding to IV fluids and supportive care, plan to start treatment with Velcade and dexamethasone Velcade will be 1.3 mg meter squared IV days 1,4,8 and 11 Dexamethasone will be 10 mg once a week

## 2020-09-12 NOTE — Progress Notes (Addendum)
HEMATOLOGY-ONCOLOGY PROGRESS NOTE  SUBJECTIVE: Madeline Burke was seen in initial consultation by Dr. Lorenso Courier on 08/25/2020.  She was referred due to elevated serum free light chains and concern for multiple myeloma.  According to the initial consult " 08/09/2020:Evaluated by [place] to due to rise in creatinine levels from 0.93 in January 2021 to 1.37 in January 2022. M-spike 0.2 g/dL, Free Kappa Lt Chain 6804.5 mg/L, Kappa/Lambda ratio >4536.33."  The patient had additional work-up including a CBC, CMP, LDH, beta-2 microglobulin, and UPEP.  UPEP is pending.  Bone survey showed multiple old compression fractures in the thoracic and lumbar spine but no definite lytic lesions.  Additionally, a bone marrow biopsy was obtained on 09/08/2020 which was consistent with plasma cell neoplasm.  The patient is now admitted after presenting with abdominal pain.  She was also having diarrhea but her daughter tells me today this is overall consistent with her baseline.  Lab work in the emergency room showed a BUN of 25 and creatinine up to 2.5 which was elevated from baseline.  She has been receiving IV fluids with no significant improvement of her AKI.  Hospitalist asked for [place] to see the patient today.  Today, patient reports that she feels fair.  Still having fatigue and weakness.  Continues to have intermittent abdominal pain.  No nausea or vomiting reported.  Her daughter who is an Therapist, sports is at the bedside.  Oncology History  Multiple myeloma ([place])  09/12/2020 Initial Diagnosis   Multiple myeloma ([place])   09/12/2020 -  Chemotherapy    Patient is on Treatment Plan: MYELOMA MAINTENANCE  BORTEZOMIB IV D1,4,8,11 Q21D         REVIEW OF SYSTEMS:   Constitutional: Denies fevers, chills  Eyes: Denies blurriness of vision Ears, nose, mouth, throat, and face: Denies mucositis or sore throat Respiratory: Denies cough, dyspnea or wheezes Cardiovascular: Denies palpitation, chest  discomfort Gastrointestinal: Reports abdominal pain with intermittent diarrhea.  No nausea or vomiting reported. Skin: Denies abnormal skin rashes Lymphatics: Denies new lymphadenopathy or easy bruising Neurological:Denies numbness, tingling or new weaknesses Behavioral/Psych: Mood is stable, no new changes  Extremities: No lower extremity edema All other systems were reviewed with the patient and are negative.  I have reviewed the past medical history, past surgical history, social history and family history with the patient and they are unchanged from previous note.   PHYSICAL EXAMINATION: ECOG PERFORMANCE STATUS: 1 - Symptomatic but completely ambulatory  Vitals:   09/11/20 2115 09/12/20 0518  BP: (!) 139/52 (!) 160/62  Pulse: (!) 57 63  Resp: 20 18  Temp: 98 F (36.7 C)   SpO2: 99% 98%   Filed Weights   09/10/20 1829  Weight: 56.7 kg    Intake/Output from previous day: 03/24 0701 - 03/25 0700 In: 1739.7 [P.O.:1320; I.V.:419.7] Out: 1200 [Urine:1200]  GENERAL:alert, no distress and comfortable SKIN: skin color, texture, turgor are normal, no rashes or significant lesions EYES: normal, Conjunctiva are pink and non-injected, sclera clear OROPHARYNX:no exudate, no erythema and lips, buccal mucosa, and tongue normal  LUNGS: clear to auscultation and percussion with normal breathing effort HEART: regular rate & rhythm and no murmurs and no lower extremity edema ABDOMEN:abdomen soft, non-tender and normal bowel sounds NEURO: alert & oriented x 3 with fluent speech, no focal motor/sensory deficits  LABORATORY DATA:  I have reviewed the data as listed CMP Latest Ref Rng & Units 09/12/2020 09/11/2020 09/10/2020  Glucose 70 - 99 mg/dL 66(L) 81 109(H)  BUN 8 -  23 mg/dL 18 22 25(H)  Creatinine 0.44 - 1.00 mg/dL 2.32(H) 2.34(H) 2.57(H)  Sodium 135 - 145 mmol/L 141 142 144  Potassium 3.5 - 5.1 mmol/L 4.2 4.1 4.6  Chloride 98 - 111 mmol/L 112(H) 112(H) 111  CO2 22 - 32 mmol/L  19(L) 21(L) 21(L)  Calcium 8.9 - 10.3 mg/dL 8.4(L) 9.1 9.6  Total Protein 6.5 - 8.1 g/dL 6.2(L) - 7.4  Total Bilirubin 0.3 - 1.2 mg/dL 0.7 - 0.3  Alkaline Phos 38 - 126 U/L 47 - 58  AST 15 - 41 U/L 19 - 24  ALT 0 - 44 U/L 12 - 13    Lab Results  Component Value Date   WBC 4.5 09/12/2020   HGB 7.7 (L) 09/12/2020   HCT 24.8 (L) 09/12/2020   MCV 91.5 09/12/2020   PLT 116 (L) 09/12/2020   NEUTROABS 1.8 09/10/2020    DG Chest 1 View  Result Date: 09/11/2020 CLINICAL DATA:  History of pulmonary embolism. EXAM: CHEST  1 VIEW COMPARISON:  July 14, 2016. FINDINGS: The heart size and mediastinal contours are within normal limits. Both lungs are clear. The visualized skeletal structures are unremarkable. IMPRESSION: No active disease. Aortic Atherosclerosis (ICD10-I70.0). Electronically Signed   By: Marijo Conception M.D.   On: 09/11/2020 08:36   CT Head Wo Contrast  Result Date: 09/11/2020 CLINICAL DATA:  Altered mental status, sudden onset confusion EXAM: CT HEAD WITHOUT CONTRAST TECHNIQUE: Contiguous axial images were obtained from the base of the skull through the vertex without intravenous contrast. COMPARISON:  06/20/2020 FINDINGS: Brain: No evidence of acute infarction, hemorrhage, hydrocephalus, extra-axial collection, visible mass lesion or mass effect. Diffuse prominence of the ventricles, cisterns and sulci compatible with parenchymal volume loss and ex vacuo dilatation unchanged from priors. Patchy areas of white matter hypoattenuation are most compatible with moderate chronic microvascular angiopathy. Vascular: Atherosclerotic calcification of the carotid siphons and intradural vertebral arteries. No hyperdense vessel. Skull: No calvarial fracture or suspicious osseous lesion. No scalp swelling or hematoma. Sinuses/Orbits: Paranasal sinuses and mastoid air cells are predominantly clear. Orbital structures are unremarkable aside from prior lens extractions. Other: None IMPRESSION: 1. No  acute intracranial findings. If there is persisting concern for acute infarction, MRI is more sensitive and specific for early features of ischemia. 2. Stable parenchymal volume loss and chronic microvascular angiopathy. Electronically Signed   By: Lovena Le M.D.   On: 09/11/2020 03:08   NM Pulmonary Perfusion  Result Date: 09/11/2020 CLINICAL DATA:  Shortness of breath, elevated creatinine, elevated D-dimer, history hypertension, diabetes mellitus EXAM: NUCLEAR MEDICINE PERFUSION LUNG SCAN TECHNIQUE: Perfusion images were obtained in multiple projections after intravenous injection of radiopharmaceutical. Ventilation scans intentionally deferred if perfusion scan and chest x-ray adequate for interpretation during COVID 19 epidemic. RADIOPHARMACEUTICALS:  3.9 mCi Tc-50mMAA IV COMPARISON:  Chest radiograph 09/11/2020 FINDINGS: Normal perfusion lung scan. Artifacts from arms on lateral view. No perfusion defects identified. IMPRESSION: Normal perfusion lung scan. Electronically Signed   By: MLavonia DanaM.D.   On: 09/11/2020 11:55   [place]  Result Date: 09/10/2020 CLINICAL DATA:  87year old female with concern for aortic Disease. EXAM: ULTRASOUND OF ABDOMINAL AORTA TECHNIQUE: Ultrasound examination of the abdominal aorta and proximal common iliac arteries was performed to evaluate for aneurysm. Additional color and Doppler images of the distal aorta were obtained to document patency. COMPARISON:  CT abdomen pelvis dated 08/01/2020. FINDINGS: Evaluation is limited due to overlying bowel gas and inability of patient to cooperate with exam. There is  atherosclerotic calcification of the visualized abdominal aorta. Abdominal aortic measurements as follows: Proximal:  2.9 x 3.0 cm Mid:  1.9 x 1.7 cm Distal: Not visualized due to bowel gas. Patent: Not evaluated. Right common iliac artery: Not visualized due to bowel gas. Left common iliac artery: Not visualized due to bowel gas. IMPRESSION: 1. Abdominal aortic  aneurysm measuring 3 cm proximally. Recommend follow-up ultrasound every 3 years. This recommendation follows ACR consensus guidelines: White Paper of the ACR Incidental Findings Committee II on Vascular Findings. J Am Coll Radiol 2013; 10:789-794. 2.  Aortic Atherosclerosis (ICD10-I70.0). Electronically Signed   By: Anner Crete M.D.   On: 09/10/2020 21:45   CT Biopsy  Result Date: 09/08/2020 INDICATION: ELEVATED SERUM IMMUNOGLOBULIN, CONCERN FOR MYELOMA EXAM: CT GUIDED RIGHT ILIAC BONE MARROW ASPIRATION AND CORE BIOPSY Date:  09/08/2020 09/08/2020 10:04 am Radiologist:  M. Daryll Brod, MD Guidance:  CT FLUOROSCOPY TIME:  Fluoroscopy Time: None. MEDICATIONS: 1% lidocaine local ANESTHESIA/SEDATION: 1.0 mg IV Versed; 50 mcg IV Fentanyl Moderate Sedation Time:  10 minutes The patient was continuously monitored during the procedure by the interventional radiology nurse under my direct supervision. CONTRAST:  None. COMPLICATIONS: None immediate PROCEDURE: Informed consent was obtained from the patient following explanation of the procedure, risks, benefits and alternatives. The patient understands, agrees and consents for the procedure. All questions were addressed. A time out was performed. The patient was positioned prone and non-contrast localization CT was performed of the pelvis to demonstrate the iliac marrow spaces. Maximal barrier sterile technique utilized including caps, mask, sterile gowns, sterile gloves, large sterile drape, hand hygiene, and Betadine prep. Under sterile conditions and local anesthesia, an 11 gauge coaxial bone biopsy needle was advanced into the right iliac marrow space. Needle position was confirmed with CT imaging. Initially, bone marrow aspiration was performed. Next, the 11 gauge outer cannula was utilized to obtain a right iliac bone marrow core biopsy. Needle was removed. Hemostasis was obtained with compression. The patient tolerated the procedure well. Samples were prepared  with the cytotechnologist. No immediate complications. IMPRESSION: CT guided right iliac bone marrow aspiration and core biopsy. Electronically Signed   By: Jerilynn Mages.  Shick M.D.   On: 09/08/2020 10:36   DG Bone Survey Met  Result Date: 09/04/2020 CLINICAL DATA:  Possible multiple myeloma. EXAM: METASTATIC BONE SURVEY COMPARISON:  August 01, 2020. FINDINGS: Multiple old compression fractures are noted in the thoracic and lumbar spine. Degenerative changes are seen involving both sacroiliac joints. Probable bilateral carotid artery calcifications are noted. No definite lytic or sclerotic lesions are noted. IMPRESSION: 1. Multiple old compression fractures are noted in the thoracic and lumbar spine. No definite lytic or sclerotic lesions are noted. 2. Probable bilateral carotid artery calcifications. Carotid ultrasound is recommended for further evaluation. Electronically Signed   By: Marijo Conception M.D.   On: 09/04/2020 20:07   CT BONE MARROW BIOPSY & ASPIRATION  Result Date: 09/08/2020 INDICATION: ELEVATED SERUM IMMUNOGLOBULIN, CONCERN FOR MYELOMA EXAM: CT GUIDED RIGHT ILIAC BONE MARROW ASPIRATION AND CORE BIOPSY Date:  09/08/2020 09/08/2020 10:04 am Radiologist:  M. Daryll Brod, MD Guidance:  CT FLUOROSCOPY TIME:  Fluoroscopy Time: None. MEDICATIONS: 1% lidocaine local ANESTHESIA/SEDATION: 1.0 mg IV Versed; 50 mcg IV Fentanyl Moderate Sedation Time:  10 minutes The patient was continuously monitored during the procedure by the interventional radiology nurse under my direct supervision. CONTRAST:  None. COMPLICATIONS: None immediate PROCEDURE: Informed consent was obtained from the patient following explanation of the procedure, risks, benefits and alternatives. The patient  understands, agrees and consents for the procedure. All questions were addressed. A time out was performed. The patient was positioned prone and non-contrast localization CT was performed of the pelvis to demonstrate the iliac marrow spaces.  Maximal barrier sterile technique utilized including caps, mask, sterile gowns, sterile gloves, large sterile drape, hand hygiene, and Betadine prep. Under sterile conditions and local anesthesia, an 11 gauge coaxial bone biopsy needle was advanced into the right iliac marrow space. Needle position was confirmed with CT imaging. Initially, bone marrow aspiration was performed. Next, the 11 gauge outer cannula was utilized to obtain a right iliac bone marrow core biopsy. Needle was removed. Hemostasis was obtained with compression. The patient tolerated the procedure well. Samples were prepared with the cytotechnologist. No immediate complications. IMPRESSION: CT guided right iliac bone marrow aspiration and core biopsy. Electronically Signed   By: Jerilynn Mages.  Shick M.D.   On: 09/08/2020 10:36    ASSESSMENT AND PLAN: Madeline Burke is a 87 y.o. female  who presented to oncology with elevated serum immunoglobulin level concerning for multiple myeloma.  LDH was elevated at 268 and beta-2 microglobulin was also elevated at 8.7.  Bone survey did not show any evidence of lytic or sclerotic lesions.  Additional work-up included a bone marrow biopsy which was consistent with plasma cell neoplasm.  The patient is now admitted due to abdominal pain and noted to have worsening renal function.  Her AKI has not improved with IV fluids.  This raises concern for AKI due to her underlying multiple myeloma.  Discussed bone marrow biopsy results and diagnosis with the patient and her daughter today which is consistent with multiple myeloma.  Due to worsening renal function, recommend initiating treatment with Velcade and dexamethasone.  Velcade will be 1.3 mg/m IV days 1, 4, 8, and 11 and dexamethasone 10 mg weekly.  Adverse effects of this treatment have been discussed with the patient and her daughter including but not limited to elevated blood sugar secondary to steroids, myelosuppression, mild nausea and vomiting, peripheral  neuropathy, possible reactivation of herpes zoster.  The patient agrees to proceed.  # Elevated serum immunoglobulin level  --Bone marrow biopsy consistent with multiple myeloma. --She has no lytic or sclerotic lesions noted on bone scan.  UPEP pending. --Diagnosis discussed with the patient and her daughter. --Treatment recommendations discussed with the patient and her daughter as outlined above and they agree to proceed. --Will begin dexamethasone 10 mg weekly today and we are working on arranging start of Velcade today as well.  # Anemia --This is due to her underlying multiple myeloma as well as renal insufficiency. --Recommend close monitoring of her hemoglobin and transfuse for hemoglobin less than 7.  # Abdominal pain: --Uncertain etiology and unlikely related to multiple myeloma.  --CT abdomen/pelvis from 08/01/20 showed no acute abnormality that explains the pain.  --Management per hospitalist.   LOS: 1 day   Mikey Bussing, DNP, AGPCNP-BC, AOCNP 09/12/20  Attending Note  I personally saw the patient, reviewed the chart and examined the patient. The plan of care was discussed with the patient and the admitting team. I agree with the assessment and plan as documented above. Thank you very much for the consultation.  I performed the majority of the assessment and treatment and plan. -Free light chain multiple myeloma: Kappa: Kappa lambda ratio greater than 4536 and free Kappa 6804, bone marrow biopsy showing multiple myeloma with 67% plasma cells. -Worsening renal failure: Serum creatinine 06/20/2021: 0.92 to 2.32 on 09/12/2020 -Recommendation:  Decadron 10 mg weekly, Velcade 1.3 mg meter squared days 1 for 8 and 11 every 3 weeks. -Discussed with the patient and her daughter about different options.  I offered them treatment versus hospice care.  She wanted to try treatment to see if her kidney function improves.  Therefore we will proceed with the first dose of Velcade today. I  will follow her for any toxicities. Dr. Lorenso Courier will be back on Monday to resume her care at that time.

## 2020-09-12 NOTE — Plan of Care (Signed)

## 2020-09-12 NOTE — Evaluation (Signed)
Occupational Therapy Evaluation Patient Details Name: Madeline Burke MRN: [id number] DOB: [birth year] Today's Date: 09/12/2020    History of Present Illness Madeline Burke is a 87 y.o. female with medical history significant of hypertension, hyperlipidemia, diabetes, angioedema, currently being worked up for elevated immunoglobulin levels who presents with abdominal pain.Patient has had some intermittent abdominal pain for months, first starting in January or February. There was a CT scan in February which did not show exhalation for the pain. Patient found to have Multiple myeloma with renal failure. Continues to have epigastric pain of unknown origin.   Clinical Impression   Madeline Burke is an 87 year old woman who presents with generalized weakness, decreased activity tolerance, impaired balance and abdominal/epigrastric pain. On evaluation she reports pain on entry and then needing to get to [facility] due to diarrhea. She required min assist for transfers and ADLs. She needs setup and seated position for task due to poor activity tolerance and impaired balance. Patient's daughter reports the plan will be to discharge home with family assistance and that they have all DME. Patient will benefit from skilled OT services while in hospital to improve deficits and learn compensatory strategies as needed in order to return PLOF.      Follow Up Recommendations  Home health OT    Equipment Recommendations  None recommended by OT    Recommendations for Other Services       Precautions / Restrictions Precautions Precautions: Fall Restrictions Weight Bearing Restrictions: No      Mobility Bed Mobility Overal bed mobility: Needs Assistance Bed Mobility: Supine to Sit     Supine to sit: Min assist;HOB elevated          Transfers Overall transfer level: Needs assistance   Transfers: Sit to/from Stand;Stand Pivot Transfers Sit to Stand: Min assist Stand pivot transfers: Min assist             Balance Overall balance assessment: Mild deficits observed, not formally tested                                         ADL either performed or assessed with clinical judgement   ADL Overall ADL's : Needs assistance/impaired Eating/Feeding: Set up   Grooming: Set up;Sitting   Upper Body Bathing: Set up;Sitting   Lower Body Bathing: Minimal assistance;Sit to/from stand   Upper Body Dressing : Set up;Sitting   Lower Body Dressing: Minimal assistance;Sit to/from stand   Toilet Transfer: Minimal assistance;BSC;Stand-pivot   Toileting- Clothing Manipulation and Hygiene: Minimal assistance;Sit to/from stand       Functional mobility during ADLs: Minimal assistance       Vision Patient Visual Report: No change from baseline       Perception     Praxis      Pertinent Vitals/Pain Pain Assessment: Faces Faces Pain Scale: Hurts even more Pain Location: epigastric Pain Descriptors / Indicators: Grimacing;Sharp;Burning Pain Intervention(s): Monitored during session;Premedicated before session     Hand Dominance Right   Extremity/Trunk Assessment Upper Extremity Assessment Upper Extremity Assessment: Generalized weakness   Lower Extremity Assessment Lower Extremity Assessment: Defer to PT evaluation   Cervical / Trunk Assessment Cervical / Trunk Assessment: Kyphotic   Communication Communication Communication: HOH   Cognition Arousal/Alertness: Awake/alert Behavior During Therapy: WFL for tasks assessed/performed Overall Cognitive Status: Within Functional Limits for tasks assessed  Home Living Family/patient expects to be discharged to:: Private residence Living Arrangements: Alone Available Help at Discharge: Family;Available PRN/intermittently Type of Home: House Home Access: Stairs to enter [employer] of Steps: 5 Entrance Stairs-Rails: Right;Left Home Layout: One level          Biochemist, clinical: Standard     Home Equipment: Toilet riser;Walker - 4 wheels;Walker - 2 wheels;Cane - single point   Additional Comments: Predominantly does sink bath at thojme.      Prior Functioning/Environment Level of Independence: Independent with assistive device(s)                 OT Problem List: Decreased strength;Decreased activity tolerance;Impaired balance (sitting and/or standing);Decreased knowledge of use of DME or AE;Pain      OT Treatment/Interventions: Self-care/ADL training;Therapeutic exercise;DME and/or AE instruction;Energy conservation;Patient/family education;Balance training;Therapeutic activities    OT Goals(Current goals can be found in the care plan section) Acute Rehab OT Goals Patient Stated Goal: to go home OT Goal Formulation: With patient Time For Goal Achievement: 09/26/20 Potential to Achieve Goals: Fair  OT Frequency: Min 2X/week    AM-PAC OT "6 Clicks" Daily Activity     Outcome Measure Help from another person eating meals?: A Little Help from another person taking care of personal grooming?: A Little Help from another person toileting, which includes using toliet, bedpan, or urinal?: A Little Help from another person bathing (including washing, rinsing, drying)?: A Little Help from another person to put on and taking off regular upper body clothing?: A Little Help from another person to put on and taking off regular lower body clothing?: A Little 6 Click Score: 18   End of Session Nurse Communication:  (Okay to see per RN)  Activity Tolerance: Patient tolerated treatment well Patient left: in chair;with call bell/phone within reach;with family/visitor present  OT Visit Diagnosis: Unsteadiness on feet (R26.81);Muscle weakness (generalized) (M62.81);Pain                Time: 2353-6144 OT Time Calculation (min): 26 min Charges:  OT General Charges $OT Visit: 1 Visit OT Evaluation $OT Eval Moderate Complexity: 1 Mod OT  Treatments $Self Care/Home Management : 8-22 mins  Ory Elting, OTR/L [place]  Office [phone] Pager: [phone]   Lenward Chancellor 09/12/2020, 12:39 PM

## 2020-09-12 NOTE — Progress Notes (Signed)
Ok to proceed with Velcade treatment today with Hg 7.7, Scr 2.32,and ANC not reported today (1.8 from 09/10/20) per Dr. Lindi Adie.  No changes to Velcade dose.  MD prefers Velcade given SQ.  Estimated CrCl 16 ml/min -  MD would like patient to start on Acyclovir 200mg  po BID while inpatient.  Raul Del Borden, [place], BCPS, BCOP 09/12/2020 1:45 PM

## 2020-09-12 NOTE — Progress Notes (Signed)
Oncology following pt.   Pt received Chemo treatment first dose Velcade. She was premedicated with Compazine. Pt tolerated treatment well. Will continue to monitor.

## 2020-09-12 NOTE — Progress Notes (Signed)
Nutrition Follow-up  DOCUMENTATION CODES:   Not applicable  INTERVENTION:  - continue Glucerna Shake once/day. - will increase 30 ml Prosource Plus from once/day to BID. - will put handouts into discharge instructions.   NUTRITION DIAGNOSIS:   Increased nutrient needs related to acute illness as evidenced by estimated needs. -ongoing  GOAL:   Patient will meet greater than or equal to 90% of their needs -unmet  MONITOR:   PO intake,Supplement acceptance,Labs,Weight trends  REASON FOR ASSESSMENT:   Consult Assessment of nutrition requirement/status  ASSESSMENT:   87 y.o. female with medical history of HTN, HLD, DM, and angioedema. She reported intermittent abdominal pain since 06/2020 or 07/2020. Patient reported improvement in pain when she eats and daughter reported that pepto-bismol seems to help. PTA she was experiencing intermittent diarrhea.  She ate 50% of all meals yesterday (total of 625 kcal and 26 grams protein). She has accepted Glucerna Shake and Prosource Plus when they have been offered to her.  Patient resting in the chair with eyes closed throughout visit. Able to talk with 2 daughter who are at bedside and provide all information.   Patient lives alone. She has three daughters who visit, usually on a daily basis but not there is no set schedule. She is able to heat up foods or make simple things, but is unable to cook a meal for herself.  The daughter who is not in the room is the one who does the grocery shopping; she often buys things that patient is unable to prepare for herself and things that do not allow for a healthy, well-rounded meal. Her other daughters will occasionally cook for her while present in her home or bring her a meal from their homes.   Patient has no known chewing or swallowing difficulties. She has had a decreased appetite and intakes over the past 4-5 months which has declined over time. Most meals she will take a few bites or spoonfuls  of food.   She has been experiencing abdominal pain for the past 1-2 months. It is unclear what makes it better or worse and at times she has stated eating makes it better and other times she has stated that eating makes it worse.  Patient does have hx of dementia. She is currently noted to be a/o to self only. Hopeful to optimize PO nutrition as a PEG would not be preferable for patient with hx of dementia.     Labs reviewed; CBGs: 61, 91, 163 mg/dl, Cl: 112 mmol/l, creatinine: 2.32 mg/dl, Ca: 8.4 mg/dl, GFR: 20 ml/min.  Medications reviewed; sliding scale novolog, 1 tablet multivitamin with minerals/day, 40 mg oral protonix//day. IVF; NS @ 75 ml/hr.     NUTRITION - FOCUSED PHYSICAL EXAM:  patient deferred  Diet Order:   Diet Order            Diet heart healthy/carb modified Room service appropriate? Yes; Fluid consistency: Thin  Diet effective now                 EDUCATION NEEDS:   Not appropriate for education at this time  Skin:  Skin Assessment: Reviewed RN Assessment  Last BM:  3/25  Height:   Ht Readings from Last 1 Encounters:  09/10/20 5\' 2"  (1.575 m)    Weight:   Wt Readings from Last 1 Encounters:  09/10/20 56.7 kg     Estimated Nutritional Needs:  Kcal:  1500-1700 kcal Protein:  70-80 grams Fluid:  >/= 1.6 L/day  Madeline Dirocco, MS, RD, LDN, CNSC Inpatient Clinical Dietitian RD pager # available in AMION  After hours/weekend pager # available in AMION  

## 2020-09-13 ENCOUNTER — Inpatient Hospital Stay (HOSPITAL_COMMUNITY): Payer: Medicare Other

## 2020-09-13 DIAGNOSIS — N178 Other acute kidney failure: Secondary | ICD-10-CM | POA: Diagnosis not present

## 2020-09-13 DIAGNOSIS — N179 Acute kidney failure, unspecified: Secondary | ICD-10-CM | POA: Diagnosis not present

## 2020-09-13 LAB — COMPREHENSIVE METABOLIC PANEL
ALT: 13 U/L (ref 0–44)
ALT: 38 U/L (ref 0–44)
AST: 24 U/L (ref 15–41)
AST: 91 U/L — ABNORMAL HIGH (ref 15–41)
Albumin: 3.6 g/dL (ref 3.5–5.0)
Albumin: 3.8 g/dL (ref 3.5–5.0)
Alkaline Phosphatase: 46 U/L (ref 38–126)
Alkaline Phosphatase: 52 U/L (ref 38–126)
Anion gap: 8 (ref 5–15)
Anion gap: 8 (ref 5–15)
BUN: 21 mg/dL (ref 8–23)
BUN: 27 mg/dL — ABNORMAL HIGH (ref 8–23)
CO2: 18 mmol/L — ABNORMAL LOW (ref 22–32)
CO2: 17 mmol/L — ABNORMAL LOW (ref 22–32)
Calcium: 8.1 mg/dL — ABNORMAL LOW (ref 8.9–10.3)
Calcium: 7.8 mg/dL — ABNORMAL LOW (ref 8.9–10.3)
Chloride: 115 mmol/L — ABNORMAL HIGH (ref 98–111)
Chloride: 113 mmol/L — ABNORMAL HIGH (ref 98–111)
Creatinine, Ser: 2.09 mg/dL — ABNORMAL HIGH (ref 0.44–1.00)
Creatinine, Ser: 1.86 mg/dL — ABNORMAL HIGH (ref 0.44–1.00)
GFR, Estimated: 23 mL/min — ABNORMAL LOW (ref >60)
GFR, Estimated: 26 mL/min — ABNORMAL LOW (ref >60)
Glucose, Bld: 218 mg/dL — ABNORMAL HIGH (ref 70–99)
Glucose, Bld: 252 mg/dL — ABNORMAL HIGH (ref 70–99)
Potassium: 4.7 mmol/L (ref 3.5–5.1)
Potassium: 4.3 mmol/L (ref 3.5–5.1)
Sodium: 141 mmol/L (ref 135–145)
Sodium: 138 mmol/L (ref 135–145)
Total Bilirubin: 0.4 mg/dL (ref 0.3–1.2)
Total Bilirubin: 0.6 mg/dL (ref 0.3–1.2)
Total Protein: 6.2 g/dL — ABNORMAL LOW (ref 6.5–8.1)
Total Protein: 6.4 g/dL — ABNORMAL LOW (ref 6.5–8.1)

## 2020-09-13 LAB — CBC
HCT: 24.4 % — ABNORMAL LOW (ref 36.0–46.0)
Hemoglobin: 7.4 g/dL — ABNORMAL LOW (ref 12.0–15.0)
MCH: 28 pg (ref 26.0–34.0)
MCHC: 30.3 g/dL (ref 30.0–36.0)
MCV: 92.4 fL (ref 80.0–100.0)
Platelets: 108 10*3/uL — ABNORMAL LOW (ref 150–400)
RBC: 2.64 MIL/uL — ABNORMAL LOW (ref 3.87–5.11)
RDW: 18.5 % — ABNORMAL HIGH (ref 11.5–15.5)
WBC: 4.6 10*3/uL (ref 4.0–10.5)
nRBC: 0 % (ref 0.0–0.2)

## 2020-09-13 LAB — GLUCOSE, CAPILLARY
Glucose-Capillary: 178 mg/dL — ABNORMAL HIGH (ref 70–99)
Glucose-Capillary: 125 mg/dL — ABNORMAL HIGH (ref 70–99)
Glucose-Capillary: 161 mg/dL — ABNORMAL HIGH (ref 70–99)
Glucose-Capillary: 173 mg/dL — ABNORMAL HIGH (ref 70–99)
Glucose-Capillary: 217 mg/dL — ABNORMAL HIGH (ref 70–99)

## 2020-09-13 LAB — BLOOD GAS, ARTERIAL
Acid-base deficit: 10.3 mmol/L — ABNORMAL HIGH (ref 0.0–2.0)
Bicarbonate: 15.7 mmol/L — ABNORMAL LOW (ref 20.0–28.0)
Drawn by: 11249
O2 Saturation: 98.1 %
Patient temperature: 99.8
pCO2 arterial: 38.6 mmHg (ref 32.0–48.0)
pH, Arterial: 7.237 — ABNORMAL LOW (ref 7.350–7.450)
pO2, Arterial: 128 mmHg — ABNORMAL HIGH (ref 83.0–108.0)

## 2020-09-13 IMAGING — DX DG CHEST 1V PORT
1 series · 1 of 1 positions shown · non-contrast
Comparison: [DATE]

CLINICAL DATA: Short of breath

EXAM:
PORTABLE CHEST 1 VIEW

[chest ap]
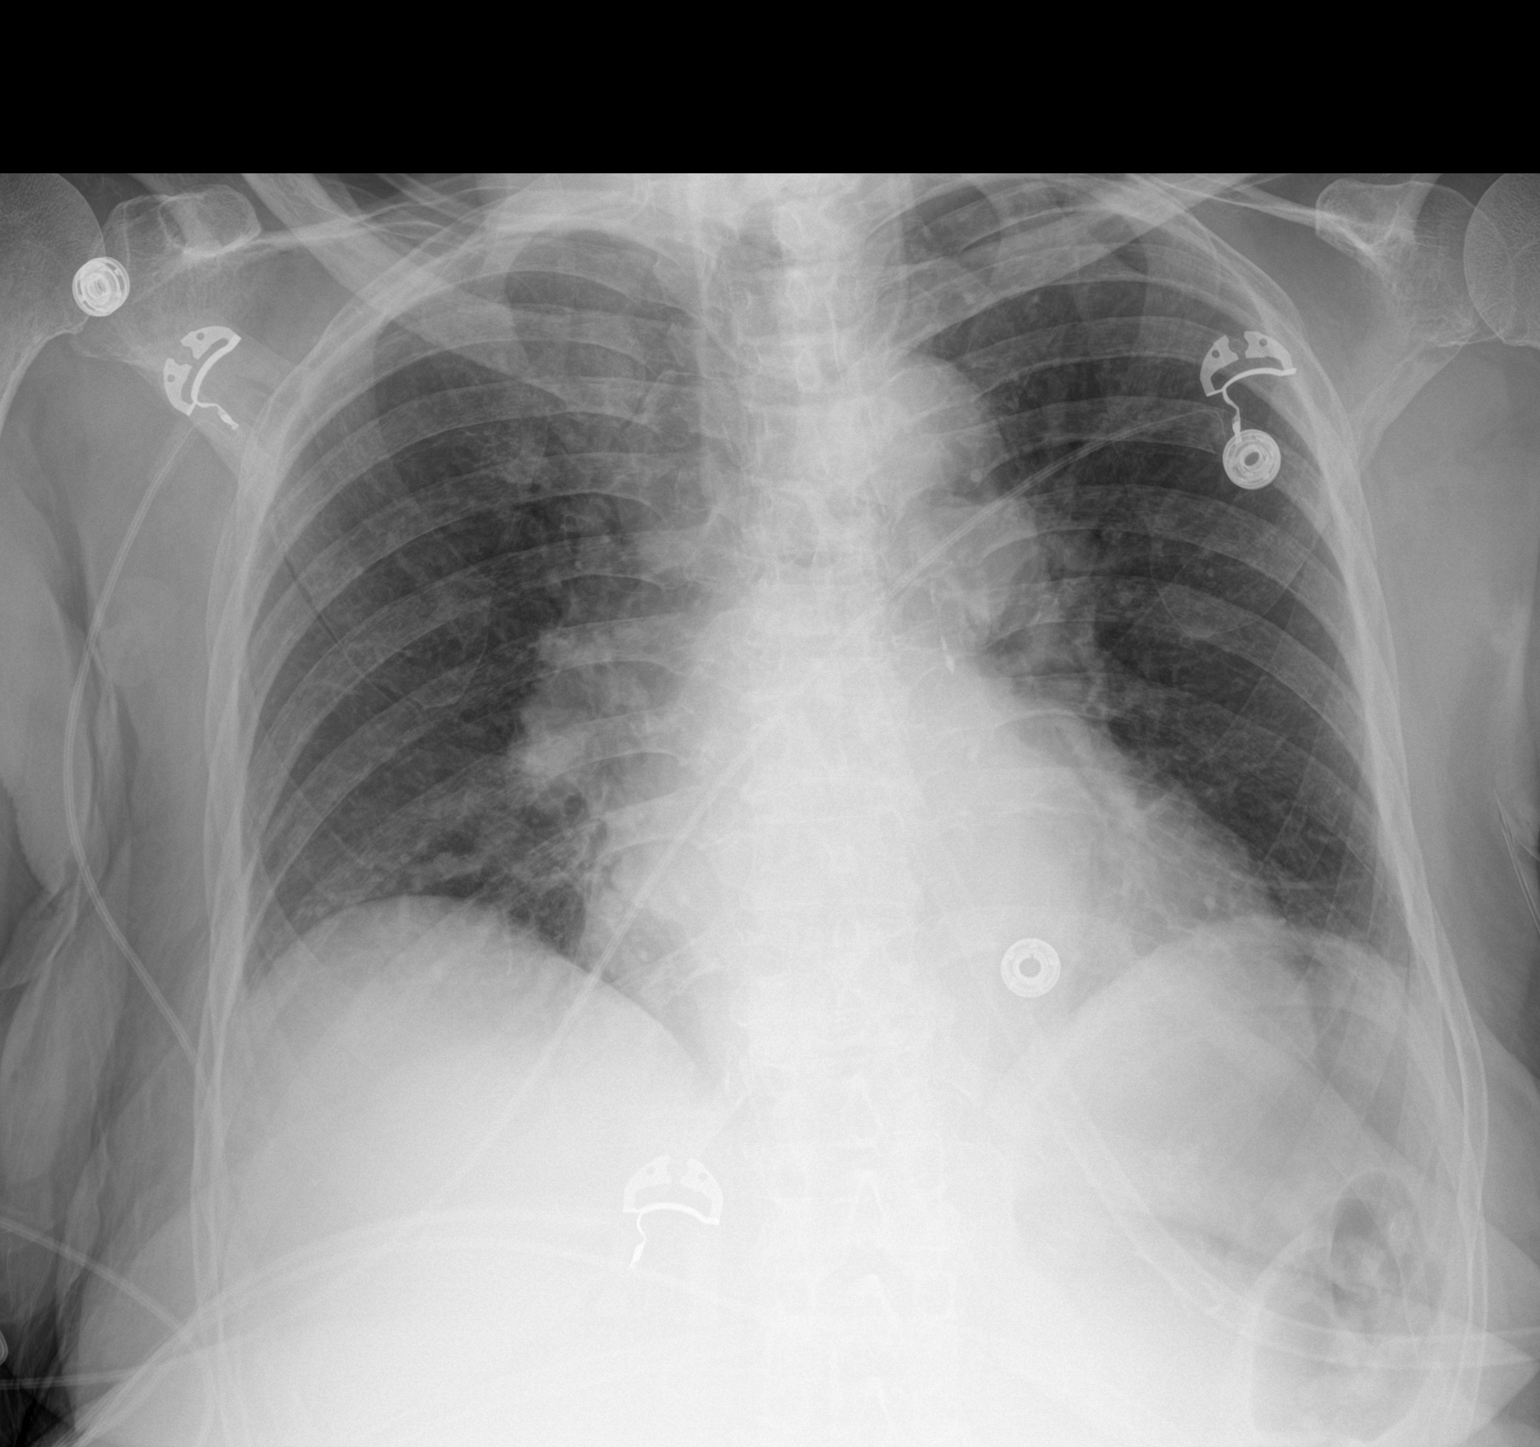

[1 of 1 positions shown; findings below may reference images not displayed]

FINDINGS: Single frontal view of the chest demonstrates a stable cardiac
silhouette. No airspace disease, effusion, or pneumothorax. No acute
bony abnormalities.
IMPRESSION: 1. Stable chest, no acute process.

## 2020-09-13 MED ORDER — IPRATROPIUM-ALBUTEROL 0.5-2.5 (3) MG/3ML IN SOLN
3.0000 mL | RESPIRATORY_TRACT | Status: DC | PRN
  Administered 2020-09-13 – 2020-09-14 (×2): 3 mL via RESPIRATORY_TRACT
  Filled 2020-09-13 (×2): qty 3

## 2020-09-13 NOTE — Plan of Care (Signed)
  Problem: Clinical Measurements: Goal: Will remain free from infection Outcome: Progressing Goal: Respiratory complications will improve Outcome: Progressing   Problem: Activity: Goal: Risk for activity intolerance will decrease Outcome: Progressing   Problem: Nutrition: Goal: Adequate nutrition will be maintained Outcome: Progressing   Problem: Safety: Goal: Ability to remain free from injury will improve Outcome: Progressing

## 2020-09-13 NOTE — Progress Notes (Signed)
PROGRESS NOTE    Madeline Burke  DJT:[id number] DOB: 08-Oct-1933 DOA: 09/10/2020 PCP: Leeroy Cha, MD    Brief Narrative:  87 y.o. female with medical history significant of hypertension, hyperlipidemia, diabetes, angioedema, currently being worked up for elevated immunoglobulin levels who presents with abdominal pain.             Patient has had some intermittent abdominal pain for months, first starting in January or February. There was a CT scan in February which did not show exhalation for the pain.             She was noted to have the pain during her oncology visit in March and it was believed not to be related to her elevated immunoglobulin levels and her work-up from their perspective.             She is continued to have waxing and waning abdominal pain.  The pain is difficult to describe but is epigastric per patient.  She states that it does seem to improve with food and her daughter states that it does seem to improve whenever she is taken Pepto-Bismol for diarrhea.  They have not noticed any other aggravating or alleviating factors. In the ED, pt was found to have evidence of ARF with Cr of 2.5, up from 1  Assessment & Plan:   Principal Problem:   AKI (acute kidney injury) ([place]) Active Problems:   Abdominal pain, epigastric   HTN (hypertension)   Diabetes ([place])   Anemia   ARF (acute renal failure) ([place])   Acute renal failure > Creatinine elevated to 2.5 up from 1.92 weeks ago which is up from 1 previous to that. > This is in the setting of her current work-up for elevated immunoglobulin levels which may be due to multiple myeloma, she is following with heme-onc for this. - Continued with IVF hydration as tolerated - Continue to avoid nephrotoxic agents -Appreciate input by Hematology. Concerns for acute renal failure related to myeloma, now on steroids and had started Velcade - Cr has improved to 2.09 today -Repeat bmet in AM  Abdominal pain > Patient has  had some previous episodes abdominal pain the last few months.  In her evaluation by heme-onc they did not believe this is related to that process in beginning of this month. > CT in February did not show explanation for the pain. > Pain has improved with pain medication in the ED > Ultrasound evaluation of aorta showed only 3 cm AAA with recommendation for 3-year follow-up. > Normal lipase > Given other negative findings and epigastric pain that is relieved with food and Pepto-Bismol.  Concern is for peptic ulcer or other gastritis. - Family reports prior GI workup, notably EGD, by [facility] as being unremarkable - will continue on PPI - Continue supportive care for now  Syncope > Patient has reportedly had a couple episodes of syncope in the setting of her severe pain. > Suspect possible vasovagal or neurocardiogenic given the setting around her pain. - cont to monitor on telemetry - VQ scan ordered in ED given elevated D-dimer, reviewed and is low prob for PE -continue with supportive care  Elevated immunoglobulin level > Undergoing work-up outpatient for this with hematology/oncology - Appreciate input by Hematology -Recommendation to initiate steroids and Velcade -Bone marrow biopsy reviewed, findings were noted to be consistent with myeloma  Anemia > Gradually worsening anemia in the setting of her work-up for her elevated immunoglobulin level. > Hemoglobin currently 8.6 which  is stable from the last couple of days.  However her hemoglobin was 11 2 months ago. - hgb 7.4 this AM, hemodynamically stable  Hypertension - Continue home Coreg and amlodipine - BP stable at present  Hyperlipidemia - Continue home rosuvastatin as tolerated  Diabetes - continue SSI coverage while in hosptial - Glucose trends are overall stable  DVT prophylaxis: Lovenox subq Code Status: Full Family Communication: Pt in room, family at bedside  Status is: Inpatient  Pt requires continued  inpatient stay because:: IV treatments appropriate due to intensity of illness or inability to take PO and Inpatient level of care appropriate due to severity of illness  Dispo: The patient is from: Home              Anticipated d/c is to: Home              Patient currently is not medically stable to d/c.   Difficult to place patient No   Consultants:   Hematology  Procedures:     Antimicrobials: Anti-infectives (From admission, onward)   Start     Dose/Rate Route Frequency Ordered Stop   09/12/20 2200  acyclovir (ZOVIRAX) 200 MG capsule 200 mg        200 mg Oral 2 times daily 09/12/20 1356        Subjective: Reports feeling better. In good spirits  Objective: Vitals:   09/12/20 1814 09/12/20 2111 09/13/20 0512 09/13/20 1335  BP: (!) 127/58 121/62 (!) 121/55 135/62  Pulse: 64 66 69 76  Resp:    18  Temp:  98 F (36.7 C) 97.8 F (36.6 C) 97.9 F (36.6 C)  TempSrc:  Oral Oral Oral  SpO2:  98% 98% 100%  Weight:      Height:        Intake/Output Summary (Last 24 hours) at 09/13/2020 1714 Last data filed at 09/12/2020 1849 Gross per 24 hour  Intake 1020 ml  Output --  Net 1020 ml   Filed Weights   09/10/20 1829  Weight: 56.7 kg    Examination: General exam: Conversant, in no acute distress Respiratory system: normal chest rise, clear, no audible wheezing Cardiovascular system: regular rhythm, s1-s2 Gastrointestinal system: Nondistended, nontender, pos BS Central nervous system: No seizures, no tremors Extremities: No cyanosis, no joint deformities Skin: No rashes, no pallor Psychiatry: Affect normal // no auditory hallucinations   Data Reviewed: I have personally reviewed following labs and imaging studies  CBC: Recent Labs  Lab 09/08/20 0800 09/10/20 1910 09/11/20 0527 09/12/20 0521 09/13/20 0619  WBC 5.5 7.1 6.4 4.5 4.6  NEUTROABS 1.3* 1.8  --   --   --   HGB 8.8* 8.6* 8.8* 7.7* 7.4*  HCT 28.1* 26.7* 27.4* 24.8* 24.4*  MCV 89.8 89.9 88.7 91.5  92.4  PLT 148* 139* 126* 116* 453*   Basic Metabolic Panel: Recent Labs  Lab 09/10/20 1910 09/11/20 0527 09/12/20 0521 09/13/20 0619  NA 144 142 141 141  K 4.6 4.1 4.2 4.7  CL 111 112* 112* 115*  CO2 21* 21* 19* 18*  GLUCOSE 109* 81 66* 218*  BUN 25* 22 18 21   CREATININE 2.57* 2.34* 2.32* 2.09*  CALCIUM 9.6 9.1 8.4* 8.1*   GFR: Estimated Creatinine Clearance: 15.6 mL/min (A) (by C-G formula based on SCr of 2.09 mg/dL (H)). Liver Function Tests: Recent Labs  Lab 09/10/20 1910 09/12/20 0521 09/13/20 0619  AST 24 19 24   ALT 13 12 13   ALKPHOS 58 47 46  BILITOT 0.3  0.7 0.4  PROT 7.4 6.2* 6.2*  ALBUMIN 4.3 3.6 3.6   Recent Labs  Lab 09/10/20 1910  LIPASE 51   No results for input(s): AMMONIA in the last 168 hours. Coagulation Profile: No results for input(s): INR, PROTIME in the last 168 hours. Cardiac Enzymes: No results for input(s): CKTOTAL, CKMB, CKMBINDEX, TROPONINI in the last 168 hours. BNP (last 3 results) No results for input(s): PROBNP in the last 8760 hours. HbA1C: No results for input(s): HGBA1C in the last 72 hours. CBG: Recent Labs  Lab 09/12/20 1622 09/12/20 2116 09/13/20 0727 09/13/20 1128 09/13/20 1645  GLUCAP 116* 292* 178* 125* 161*   Lipid Profile: No results for input(s): CHOL, HDL, LDLCALC, TRIG, CHOLHDL, LDLDIRECT in the last 72 hours. Thyroid Function Tests: No results for input(s): TSH, T4TOTAL, FREET4, T3FREE, THYROIDAB in the last 72 hours. Anemia Panel: No results for input(s): VITAMINB12, FOLATE, FERRITIN, TIBC, IRON, RETICCTPCT in the last 72 hours. Sepsis Labs: No results for input(s): PROCALCITON, LATICACIDVEN in the last 168 hours.  Recent Results (from the past 240 hour(s))  SARS CORONAVIRUS 2 (TAT 6-24 HRS) Nasopharyngeal Nasopharyngeal Swab     Status: None   Collection Time: 09/10/20 10:42 PM   Specimen: Nasopharyngeal Swab  Result Value Ref Range Status   SARS Coronavirus 2 NEGATIVE NEGATIVE Final    Comment:  (NOTE) SARS-CoV-2 target nucleic acids are NOT DETECTED.  The SARS-CoV-2 RNA is generally detectable in upper and lower respiratory specimens during the acute phase of infection. Negative results do not preclude SARS-CoV-2 infection, do not rule out co-infections with other pathogens, and should not be used as the sole basis for treatment or other patient management decisions. Negative results must be combined with clinical observations, patient history, and epidemiological information. The expected result is Negative.  Fact Sheet for Patients: SugarRoll.be  Fact Sheet for Healthcare Providers: https://www.woods-mathews.com/  This test is not yet approved or cleared by the [place] FDA and  has been authorized for detection and/or diagnosis of SARS-CoV-2 by FDA under an Emergency Use Authorization (EUA). This EUA will remain  in effect (meaning this test can be used) for the duration of the COVID-19 declaration under Se ction 564(b)(1) of the Act, 21 U.S.C. section 360bbb-3(b)(1), unless the authorization is terminated or revoked sooner.  Performed at [facility], [place] [address], [place], [place] [address]      Radiology Studies: US RENAL  Result Date: 09/12/2020 CLINICAL DATA:  Acute renal failure EXAM: RENAL / URINARY TRACT ULTRASOUND COMPLETE COMPARISON:  CT 08/01/2020 FINDINGS: Right Kidney: Renal measurements: 9.5 x 4.9 x 4.7 cm = volume: 114 mL. Slightly increased echogenicity of the renal parenchyma. No hydronephrosis. Left Kidney: Renal measurements: 11.1 x 4.3 x 4.4 cm = volume: 108 mL. Slightly increased echogenicity of the renal parenchyma. No hydronephrosis. Bladder: Recent voiding.  No visible urine in the bladder presently. Other: Arterial and venous flow present to each kidney. IMPRESSION: No obstruction. Kidneys within normal limits in size for age. Echogenic renal parenchyma consistent with renal parenchymal  disease. Electronically Signed   By: Nelson Chimes M.D.   On: 09/12/2020 12:57    Scheduled Meds:  (feeding supplement) PROSource Plus  30 mL Oral BID BM   acyclovir  200 mg Oral BID   amLODipine  2.5 mg Oral Daily   carvedilol  25 mg Oral BID WC   cycloSPORINE  1 drop Both Eyes BID   dexamethasone (DECADRON) injection  10 mg Intravenous Weekly   enoxaparin (  LOVENOX) injection  30 mg Subcutaneous Q24H   feeding supplement (GLUCERNA SHAKE)  237 mL Oral Q24H   insulin aspart  0-9 Units Subcutaneous TID WC   multivitamin with minerals  1 tablet Oral Daily   pantoprazole  40 mg Oral Daily   rosuvastatin  10 mg Oral Daily   sodium chloride flush  3 mL Intravenous Q12H   Continuous Infusions:  sodium chloride 75 mL/hr at 09/12/20 1458     LOS: 2 days   Marylu Lund, MD [facility] Pager On Amion  If 7PM-7AM, please contact night-coverage 09/13/2020, 5:14 PM

## 2020-09-13 NOTE — Progress Notes (Signed)
Upon transferring from chair to bed PT experienced chest pain MD notified. EKG was done and placed in chart. Will continue to monitor.

## 2020-09-13 NOTE — Progress Notes (Signed)
HEMATOLOGY-ONCOLOGY PROGRESS NOTE  SUBJECTIVE: Yesterday she received her first dose of Velcade.  She had it subcutaneously.  She tolerated it extremely well.  Did not have any nausea vomiting issues.  She is in good spirits today.  She tells me that she has been able to walk the hallway with the help of a walker.  She is spending more time in the chair as well.  She denies any bone pain or discomfort.  Oncology History  Multiple myeloma ([place])  09/12/2020 Initial Diagnosis   Multiple myeloma ([place])   09/12/2020 -  Chemotherapy    Patient is on Treatment Plan: MYELOMA MAINTENANCE  BORTEZOMIB SQ D1,4,8,11 Q21D        OBJECTIVE: REVIEW OF SYSTEMS:   Constitutional: Denies fevers, chills or abnormal weight loss   All other systems were reviewed with the patient and are negative.  I have reviewed the past medical history, past surgical history, social history and family history with the patient and they are unchanged from previous note.   PHYSICAL EXAMINATION: ECOG PERFORMANCE STATUS: 2 - Symptomatic, <50% confined to bed  Vitals:   09/12/20 2111 09/13/20 0512  BP: 121/62 (!) 121/55  Pulse: 66 69  Resp:    Temp: 98 F (36.7 C) 97.8 F (36.6 C)  SpO2: 98% 98%   Filed Weights   09/10/20 1829  Weight: 125 lb (56.7 kg)    GENERAL:alert, no distress and comfortable SKIN: skin color, texture, turgor are normal, no rashes or significant lesions EYES: normal, Conjunctiva are pink and non-injected, sclera clear OROPHARYNX:no exudate, no erythema and lips, buccal mucosa, and tongue normal  NECK: supple, thyroid normal size, non-tender, without nodularity LYMPH:  no palpable lymphadenopathy in the cervical, axillary or inguinal LUNGS: clear to auscultation and percussion with normal breathing effort HEART: regular rate & rhythm and no murmurs and no lower extremity edema ABDOMEN:abdomen soft, non-tender and normal bowel sounds Musculoskeletal:no cyanosis of digits and no clubbing   NEURO: alert & oriented x 3 with fluent speech, no focal motor/sensory deficits, slight dementia  LABORATORY DATA:  I have reviewed the data as listed CMP Latest Ref Rng & Units 09/13/2020 09/12/2020 09/11/2020  Glucose 70 - 99 mg/dL 218(H) 66(L) 81  BUN 8 - 23 mg/dL 21 18 22   Creatinine 0.44 - 1.00 mg/dL 2.09(H) 2.32(H) 2.34(H)  Sodium 135 - 145 mmol/L 141 141 142  Potassium 3.5 - 5.1 mmol/L 4.7 4.2 4.1  Chloride 98 - 111 mmol/L 115(H) 112(H) 112(H)  CO2 22 - 32 mmol/L 18(L) 19(L) 21(L)  Calcium 8.9 - 10.3 mg/dL 8.1(L) 8.4(L) 9.1  Total Protein 6.5 - 8.1 g/dL 6.2(L) 6.2(L) -  Total Bilirubin 0.3 - 1.2 mg/dL 0.4 0.7 -  Alkaline Phos 38 - 126 U/L 46 47 -  AST 15 - 41 U/L 24 19 -  ALT 0 - 44 U/L 13 12 -    Lab Results  Component Value Date   WBC 4.6 09/13/2020   HGB 7.4 (L) 09/13/2020   HCT 24.4 (L) 09/13/2020   MCV 92.4 09/13/2020   PLT 108 (L) 09/13/2020   NEUTROABS 1.8 09/10/2020    ASSESSMENT AND PLAN: 1.  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 She tolerated it extremely well. Her creatinine has improved today from 2.32 to 2.09. Our plan is to continue to watch and monitor through the weekend and give her the second dose of Velcade on Monday and subsequently plan to discharge  her home. Dr. Lorenso Courier will continue her treatment as an outpatient. He may decide to continue with Velcade or add Revlimid to her treatment plan. 2. acute renal failure secondary to light chain myeloma 3.  Hyperglycemia: Steroid-induced.  She received first dose of Decadron yesterday.

## 2020-09-13 NOTE — TOC Progression Note (Signed)
Transition of Care [facility]) - Progression Note    Patient Details  Name: Madeline Burke MRN: [id number] Date of Birth: 1933/09/20  [facility] CM/SW Contact  Joaquin Courts, RN Phone Number: 09/13/2020, 2:57 PM  Clinical Narrative:    CM spoke with patient's daughter regarding recommendation for HHPT.  Referral given to [facility] rep Amy.    Expected Discharge Plan: [place] Barriers to Discharge: No Barriers Identified  Expected Discharge Plan and Services Expected Discharge Plan: [place]   Discharge Planning Services: CM Consult Post Acute Care Choice: [place] arrangements for the past 2 months: Single Family Home                           HH Arranged: PT [place]: [facility] Date [place]: 09/13/20 Time [place]: [place] Representative spoke with at [place]: Amy   Social Determinants of Health ([place]) Interventions    Readmission Risk Interventions No flowsheet data found.

## 2020-09-14 ENCOUNTER — Inpatient Hospital Stay (HOSPITAL_COMMUNITY): Payer: Medicare Other

## 2020-09-14 DIAGNOSIS — I361 Nonrheumatic tricuspid (valve) insufficiency: Secondary | ICD-10-CM

## 2020-09-14 DIAGNOSIS — N179 Acute kidney failure, unspecified: Secondary | ICD-10-CM | POA: Diagnosis not present

## 2020-09-14 DIAGNOSIS — N178 Other acute kidney failure: Secondary | ICD-10-CM | POA: Diagnosis not present

## 2020-09-14 DIAGNOSIS — R55 Syncope and collapse: Secondary | ICD-10-CM

## 2020-09-14 LAB — CBC WITH DIFFERENTIAL/PLATELET
Abs Immature Granulocytes: 0.21 10*3/uL — ABNORMAL HIGH (ref 0.00–0.07)
Basophils Absolute: 0 10*3/uL (ref 0.0–0.1)
Basophils Relative: 1 %
Eosinophils Absolute: 0 10*3/uL (ref 0.0–0.5)
Eosinophils Relative: 0 %
HCT: 22.1 % — ABNORMAL LOW (ref 36.0–46.0)
Hemoglobin: 7 g/dL — ABNORMAL LOW (ref 12.0–15.0)
Immature Granulocytes: 3 %
Lymphocytes Relative: 18 %
Lymphs Abs: 1.5 10*3/uL (ref 0.7–4.0)
MCH: 28.1 pg (ref 26.0–34.0)
MCHC: 31.7 g/dL (ref 30.0–36.0)
MCV: 88.8 fL (ref 80.0–100.0)
Monocytes Absolute: 1.7 10*3/uL — ABNORMAL HIGH (ref 0.1–1.0)
Monocytes Relative: 20 %
Neutro Abs: 5 10*3/uL (ref 1.7–7.7)
Neutrophils Relative %: 58 %
Platelets: 110 10*3/uL — ABNORMAL LOW (ref 150–400)
RBC: 2.49 MIL/uL — ABNORMAL LOW (ref 3.87–5.11)
RDW: 18.9 % — ABNORMAL HIGH (ref 11.5–15.5)
WBC Morphology: ABNORMAL
WBC: 8.4 10*3/uL (ref 4.0–10.5)
nRBC: 0.5 % — ABNORMAL HIGH (ref 0.0–0.2)

## 2020-09-14 LAB — COMPREHENSIVE METABOLIC PANEL
ALT: 33 U/L (ref 0–44)
AST: 91 U/L — ABNORMAL HIGH (ref 15–41)
Albumin: 3.7 g/dL (ref 3.5–5.0)
Alkaline Phosphatase: 55 U/L (ref 38–126)
Anion gap: 11 (ref 5–15)
BUN: 27 mg/dL — ABNORMAL HIGH (ref 8–23)
CO2: 16 mmol/L — ABNORMAL LOW (ref 22–32)
Calcium: 8.2 mg/dL — ABNORMAL LOW (ref 8.9–10.3)
Chloride: 116 mmol/L — ABNORMAL HIGH (ref 98–111)
Creatinine, Ser: 1.83 mg/dL — ABNORMAL HIGH (ref 0.44–1.00)
GFR, Estimated: 27 mL/min — ABNORMAL LOW (ref >60)
Glucose, Bld: 145 mg/dL — ABNORMAL HIGH (ref 70–99)
Potassium: 4.3 mmol/L (ref 3.5–5.1)
Sodium: 143 mmol/L (ref 135–145)
Total Bilirubin: 0.6 mg/dL (ref 0.3–1.2)
Total Protein: 6.4 g/dL — ABNORMAL LOW (ref 6.5–8.1)

## 2020-09-14 LAB — CBC
HCT: 23 % — ABNORMAL LOW (ref 36.0–46.0)
Hemoglobin: 7.2 g/dL — ABNORMAL LOW (ref 12.0–15.0)
MCH: 28.9 pg (ref 26.0–34.0)
MCHC: 31.3 g/dL (ref 30.0–36.0)
MCV: 92.4 fL (ref 80.0–100.0)
Platelets: 100 10*3/uL — ABNORMAL LOW (ref 150–400)
RBC: 2.49 MIL/uL — ABNORMAL LOW (ref 3.87–5.11)
RDW: 18.9 % — ABNORMAL HIGH (ref 11.5–15.5)
WBC: 8.3 10*3/uL (ref 4.0–10.5)
nRBC: 0.7 % — ABNORMAL HIGH (ref 0.0–0.2)

## 2020-09-14 LAB — ECHOCARDIOGRAM COMPLETE
Area-P 1/2: 4.15 cm2
Height: 62 in
S' Lateral: 2.6 cm
Weight: 2000 oz

## 2020-09-14 LAB — GLUCOSE, CAPILLARY
Glucose-Capillary: 123 mg/dL — ABNORMAL HIGH (ref 70–99)
Glucose-Capillary: 121 mg/dL — ABNORMAL HIGH (ref 70–99)
Glucose-Capillary: 140 mg/dL — ABNORMAL HIGH (ref 70–99)
Glucose-Capillary: 118 mg/dL — ABNORMAL HIGH (ref 70–99)
Glucose-Capillary: 174 mg/dL — ABNORMAL HIGH (ref 70–99)

## 2020-09-14 LAB — LACTIC ACID, PLASMA
Lactic Acid, Venous: 1.3 mmol/L (ref 0.5–1.9)
Lactic Acid, Venous: 1.1 mmol/L (ref 0.5–1.9)

## 2020-09-14 MED ORDER — CARVEDILOL 6.25 MG PO TABS
6.2500 mg | ORAL_TABLET | Freq: Two times a day (BID) | ORAL | Status: DC
  Administered 2020-09-14 – 2020-09-19 (×10): 6.25 mg via ORAL
  Filled 2020-09-14 (×11): qty 1

## 2020-09-14 MED ORDER — LORAZEPAM 2 MG/ML IJ SOLN: 2.0000 mg | INTRAMUSCULAR | Status: DC | PRN

## 2020-09-14 MED ORDER — MORPHINE SULFATE (PF) 2 MG/ML IV SOLN
2.0000 mg | INTRAVENOUS | Status: AC | PRN
  Administered 2020-09-14 – 2020-09-16 (×3): 2 mg via INTRAVENOUS
  Filled 2020-09-14 (×3): qty 1

## 2020-09-14 MED ORDER — SODIUM BICARBONATE 650 MG PO TABS
650.0000 mg | ORAL_TABLET | Freq: Two times a day (BID) | ORAL | Status: DC
  Administered 2020-09-14 – 2020-09-19 (×10): 650 mg via ORAL
  Filled 2020-09-14 (×10): qty 1

## 2020-09-14 MED ORDER — SODIUM CHLORIDE 0.9 % IV SOLN: INTRAVENOUS | Status: DC

## 2020-09-14 MED ORDER — MORPHINE SULFATE (PF) 2 MG/ML IV SOLN
1.0000 mg | INTRAVENOUS | Status: AC | PRN
Start: 2020-09-14 — End: 2020-09-14
  Administered 2020-09-14: 1 mg via INTRAVENOUS
  Filled 2020-09-14: qty 1

## 2020-09-14 MED ORDER — TRAMADOL HCL 50 MG PO TABS
50.0000 mg | ORAL_TABLET | Freq: Four times a day (QID) | ORAL | Status: AC | PRN
  Administered 2020-09-14 (×2): 50 mg via ORAL
  Filled 2020-09-14 (×2): qty 1

## 2020-09-14 NOTE — Plan of Care (Signed)

## 2020-09-14 NOTE — Progress Notes (Signed)
PT Cancellation Note  Patient Details Name: Madeline Burke MRN: [id number] DOB: 1933/09/21   Cancelled Treatment:    Reason Eval/Treat Not Completed:  Noted events of last night. Pt looks like she doesn't feel well on today. She declined participation with PT on today. Will check back another day.  [facility]  Office: [phone] Pager: [phone]

## 2020-09-14 NOTE — Progress Notes (Addendum)
The patient's RN notified the CN that the patient's Oxygen SATs were 65 %.  The HR was 38.The Rapid Response Nurse was notified at the same time.When the CN saw the patient she was unresponsive, with dyspnea, increased work of breathing,labored breathing, and skin pallor was ashen. The patient's name was called several times but the patient remained unresponsive.  At 2140 the Vitals were: HR 66;RR28;151/60 (84);100% on 3 L .The Blood Glucose was checked and the CBG was 217. Gradually the patient became coherent and oriented. Then the patient c/o chest pain. A 12-lead EKG was done and the results are in Epic. The PCP on call was notified. We will continue to monitor the patient.

## 2020-09-14 NOTE — Progress Notes (Signed)
HEMATOLOGY-ONCOLOGY PROGRESS NOTE  SUBJECTIVE: She tells me she had a bad night last night with diffuse achiness in her body.  She could not sleep too well.  Oncology History  Multiple myeloma ([place])  09/12/2020 Initial Diagnosis   Multiple myeloma ([place])   09/12/2020 -  Chemotherapy    Patient is on Treatment Plan: MYELOMA MAINTENANCE  BORTEZOMIB SQ D1,4,8,11 Q21D        OBJECTIVE: REVIEW OF SYSTEMS:   Generalized fatigue and weakness and achiness  PHYSICAL EXAMINATION: ECOG PERFORMANCE STATUS: 2 - Symptomatic, <50% confined to bed  Vitals:   09/13/20 2200 09/14/20 0639  BP:  (!) 142/57  Pulse:  65  Resp: 19 (!) 22  Temp:  97.6 F (36.4 C)  SpO2:  100%   Filed Weights   09/10/20 1829  Weight: 125 lb (56.7 kg)    GENERAL:alert, no distress and comfortable SKIN: skin color, texture, turgor are normal, no rashes or significant lesions EYES: normal, Conjunctiva are pink and non-injected, sclera clear OROPHARYNX:no exudate, no erythema and lips, buccal mucosa, and tongue normal  NECK: supple, thyroid normal size, non-tender, without nodularity LYMPH:  no palpable lymphadenopathy in the cervical, axillary or inguinal LUNGS: clear to auscultation and percussion with normal breathing effort HEART: regular rate & rhythm and no murmurs and no lower extremity edema ABDOMEN:abdomen soft, non-tender and normal bowel sounds Musculoskeletal:no cyanosis of digits and no clubbing  NEURO: alert & oriented x 3 with fluent speech, no focal motor/sensory deficits  LABORATORY DATA:  I have reviewed the data as listed CMP Latest Ref Rng & Units 09/14/2020 09/13/2020 09/13/2020  Glucose 70 - 99 mg/dL 145(H) 252(H) 218(H)  BUN 8 - 23 mg/dL 27(H) 27(H) 21  Creatinine 0.44 - 1.00 mg/dL 1.83(H) 1.86(H) 2.09(H)  Sodium 135 - 145 mmol/L 143 138 141  Potassium 3.5 - 5.1 mmol/L 4.3 4.3 4.7  Chloride 98 - 111 mmol/L 116(H) 113(H) 115(H)  CO2 22 - 32 mmol/L 16(L) 17(L) 18(L)  Calcium 8.9 - 10.3  mg/dL 8.2(L) 7.8(L) 8.1(L)  Total Protein 6.5 - 8.1 g/dL 6.4(L) 6.4(L) 6.2(L)  Total Bilirubin 0.3 - 1.2 mg/dL 0.6 0.6 0.4  Alkaline Phos 38 - 126 U/L 55 52 46  AST 15 - 41 U/L 91(H) 91(H) 24  ALT 0 - 44 U/L 33 38 13    Lab Results  Component Value Date   WBC 8.3 09/14/2020   HGB 7.2 (L) 09/14/2020   HCT 23.0 (L) 09/14/2020   MCV 92.4 09/14/2020   PLT 100 (L) 09/14/2020   NEUTROABS 5.0 09/13/2020    ASSESSMENT AND PLAN: 1.  Multiple myeloma light chain disease: Status post first dose of Velcade on Friday, 09/12/2020. Second dose to be planned to be given on Monday, 09/15/2020. 2. acute renal failure: Slight improvement compared to yesterday with a creatinine of 1.83 today. I discussed with her that it is possible for her to be discharged home tomorrow after the Velcade injection Dr. Lorenso Courier can follow the patient as outpatient for continued treatment.

## 2020-09-14 NOTE — Progress Notes (Signed)
Rapid Response Event Note   Reason for Call : Called d/t pt had sats of 60's and change in LOC.     Initial Focused Assessment: Pt lying bed slow to respond.  However, VSS.  Able to follow commands after a few minutes.  C/O chest pain,  unable to rate pain but then denied pain.  SOB noted as time progressed with some wheezes.      Interventions: After assessment,  NP notified, PCR, EKG and CBG collected.  ABG and breathing treatment received.  Labs ordered per NP.  See flow sheet for VS.   Plan of Care: Pt much more alert, f/c and back to herself.  Will remand in current room [facility], NP aware and will f/u on pt results. Pt Primary RN to continue to monitor and report.    Dyann Ruddle, RN

## 2020-09-14 NOTE — Progress Notes (Signed)
Echocardiogram 2D Echocardiogram with 3D and strain has been performed.  [name] M 09/14/2020, 2:46 PM

## 2020-09-14 NOTE — Progress Notes (Signed)
PROGRESS NOTE    Madeline Burke  EXH:[id number] DOB: Dec 21, 1933 DOA: 09/10/2020 PCP: Leeroy Cha, MD    Brief Narrative:  87 y.o. female with medical history significant of hypertension, hyperlipidemia, diabetes, angioedema, currently being worked up for elevated immunoglobulin levels who presents with abdominal pain.             Patient has had some intermittent abdominal pain for months, first starting in January or February. There was a CT scan in February which did not show exhalation for the pain.             She was noted to have the pain during her oncology visit in March and it was believed not to be related to her elevated immunoglobulin levels and her work-up from their perspective.             She is continued to have waxing and waning abdominal pain.  The pain is difficult to describe but is epigastric per patient.  She states that it does seem to improve with food and her daughter states that it does seem to improve whenever she is taken Pepto-Bismol for diarrhea.  They have not noticed any other aggravating or alleviating factors. In the ED, pt was found to have evidence of ARF with Cr of 2.5, up from 1  Assessment & Plan:   Principal Problem:   AKI (acute kidney injury) ([place]) Active Problems:   Abdominal pain, epigastric   HTN (hypertension)   Diabetes ([place])   Anemia   ARF (acute renal failure) ([place])   Acute renal failure > Creatinine elevated to 2.5 up from 1.92 weeks ago which is up from 1 previous to that. > This is in the setting of her current work-up for elevated immunoglobulin levels which may be due to multiple myeloma, she is following with heme-onc for this. - Continued with IVF hydration as tolerated - Continue to avoid nephrotoxic agents -Appreciate input by Hematology. Concerns for acute renal failure related to myeloma, now on steroids and had started Velcade - Cr is continuing to improve, down to 1.83 today -Repeat bmet in AM  Metabolic  acidosis -Abg from overnight reviewed, findings notable for pH 7.2, unremarkable pCO2. -on BMET, serum bicarb noted to be 16 -Lactate is normal -Suspect lactate secondary to renal failure related to myeloma -Have started scheduled PO sodium bicarb -Repeat bmet in AM  Abdominal pain > Patient has had some previous episodes abdominal pain the last few months.  In her evaluation by heme-onc they did not believe this is related to that process in beginning of this month. > CT in February did not show explanation for the pain. > Pain has improved with pain medication in the ED > Ultrasound evaluation of aorta showed only 3 cm AAA with recommendation for 3-year follow-up. > Normal lipase > Given other negative findings and epigastric pain that is relieved with food and Pepto-Bismol.  Concern is for peptic ulcer or other gastritis. - Family reports prior GI workup, notably EGD, by [facility] as being unremarkable - will continue on PPI - Continue supportive care for now  Syncope > Patient has reportedly had a couple episodes of syncope in the setting of her severe pain. > Initially suspected possible vasovagal or neurocardiogenic given the setting around her pain. - VQ scan ordered in ED given elevated D-dimer, reviewed and is low prob for PE -Overnight events noted. Pt had recurrent syncopal episode with noted hypoexemia and bradycardia -Have ordered 2d echo, pending result -  Evidence of tongue biting noted on exam. Initial head CT reviewed, unremarkable -Will order follow up MRI. Will order EEG -Continue with seizure precautions -Cont with PRN ativan for seizures  Elevated immunoglobulin level > Undergoing work-up outpatient for this with hematology/oncology - Appreciate input by Hematology -Recommendation to initiate steroids and Velcade -Bone marrow biopsy reviewed, findings were noted to be consistent with myeloma -Next dose of Velcate 3/28  Anemia > Gradually worsening anemia in  the setting of her work-up for her elevated immunoglobulin level. > Hemoglobin currently 8.6 which is stable from the last couple of days.  However her hemoglobin was 11 2 months ago. - remains hemodynamically stable  Hypertension - Continue home Coreg and amlodipine - BP stable at present  Hyperlipidemia - Continue home rosuvastatin as tolerated  Diabetes - continue SSI coverage while in hosptial - Glucose trends are overall stable  DVT prophylaxis: Lovenox subq Code Status: Full Family Communication: Pt in room, family is currently at bedside  Status is: Inpatient  Pt requires continued inpatient stay because:: IV treatments appropriate due to intensity of illness or inability to take PO and Inpatient level of care appropriate due to severity of illness  Dispo: The patient is from: Home              Anticipated d/c is to: Home              Patient currently is not medically stable to d/c.   Difficult to place patient No   Consultants:   Hematology  Procedures:     Antimicrobials: Anti-infectives (From admission, onward)   Start     Dose/Rate Route Frequency Ordered Stop   09/12/20 2200  acyclovir (ZOVIRAX) 200 MG capsule 200 mg        200 mg Oral 2 times daily 09/12/20 1356        Subjective: Reported feeling mildly nauseated this AM  Objective: Vitals:   09/13/20 2200 09/14/20 0639 09/14/20 1231 09/14/20 1312  BP:  (!) 142/57  (!) 107/53  Pulse:  65  60  Resp: 19 (!) 22  16  Temp:  97.6 F (36.4 C)  98.7 F (37.1 C)  TempSrc:  Oral  Oral  SpO2:  100% 100% 100%  Weight:      Height:        Intake/Output Summary (Last 24 hours) at 09/14/2020 1501 Last data filed at 09/14/2020 0700 Gross per 24 hour  Intake 940 ml  Output 975 ml  Net -35 ml   Filed Weights   09/10/20 1829  Weight: 56.7 kg    Examination: General exam: Awake, laying in bed, in nad, evidence of tongue biting on R side of tongue Respiratory system: Normal respiratory effort,  no wheezing Cardiovascular system: regular rate, s1, s2 Gastrointestinal system: Soft, nondistended, positive BS Central nervous system: CN2-12 grossly intact, strength intact Extremities: Perfused, no clubbing Skin: Normal skin turgor, no notable skin lesions seen Psychiatry: Mood normal // no visual hallucinations   Data Reviewed: I have personally reviewed following labs and imaging studies  CBC: Recent Labs  Lab 09/08/20 0800 09/10/20 1910 09/11/20 0527 09/12/20 0521 09/13/20 0619 09/13/20 2325 09/14/20 0516  WBC 5.5 7.1 6.4 4.5 4.6 8.4 8.3  NEUTROABS 1.3* 1.8  --   --   --  5.0  --   HGB 8.8* 8.6* 8.8* 7.7* 7.4* 7.0* 7.2*  HCT 28.1* 26.7* 27.4* 24.8* 24.4* 22.1* 23.0*  MCV 89.8 89.9 88.7 91.5 92.4 88.8 92.4  PLT 148* 139*  126* 116* 108* 110* 673*   Basic Metabolic Panel: Recent Labs  Lab 09/11/20 0527 09/12/20 0521 09/13/20 0619 09/13/20 2325 09/14/20 0516  NA 142 141 141 138 143  K 4.1 4.2 4.7 4.3 4.3  CL 112* 112* 115* 113* 116*  CO2 21* 19* 18* 17* 16*  GLUCOSE 81 66* 218* 252* 145*  BUN _0 27* 27*  CREATININE 2.34* 2.32* 2.09* 1.86* 1.83*  CALCIUM 9.1 8.4* 8.1* 7.8* 8.2*   GFR: Estimated Creatinine Clearance: 17.8 mL/min (A) (by C-G formula based on SCr of 1.83 mg/dL (H)). Liver Function Tests: Recent Labs  Lab 09/10/20 1910 09/12/20 0521 09/13/20 0619 09/13/20 2325 09/14/20 0516  AST _1 91* 91*  ALT _2 38 33  ALKPHOS 58 47 46 52 55  BILITOT 0.3 0.7 0.4 0.6 0.6  PROT 7.4 6.2* 6.2* 6.4* 6.4*  ALBUMIN 4.3 3.6 3.6 3.8 3.7   Recent Labs  Lab 09/10/20 1910  LIPASE 51   No results for input(s): AMMONIA in the last 168 hours. Coagulation Profile: No results for input(s): INR, PROTIME in the last 168 hours. Cardiac Enzymes: No results for input(s): CKTOTAL, CKMB, CKMBINDEX, TROPONINI in the last 168 hours. BNP (last 3 results) No results for input(s): PROBNP in the last 8760 hours. HbA1C: No results for input(s): HGBA1C in  the last 72 hours. CBG: Recent Labs  Lab 09/13/20 1645 09/13/20 2041 09/13/20 2129 09/14/20 0751 09/14/20 1106  GLUCAP 161* 173* 217* 123* 121*   Lipid Profile: No results for input(s): CHOL, HDL, LDLCALC, TRIG, CHOLHDL, LDLDIRECT in the last 72 hours. Thyroid Function Tests: No results for input(s): TSH, T4TOTAL, FREET4, T3FREE, THYROIDAB in the last 72 hours. Anemia Panel: No results for input(s): VITAMINB12, FOLATE, FERRITIN, TIBC, IRON, RETICCTPCT in the last 72 hours. Sepsis Labs: Recent Labs  Lab 09/14/20 1251  LATICACIDVEN 1.3    Recent Results (from the past 240 hour(s))  SARS CORONAVIRUS 2 (TAT 6-24 HRS) Nasopharyngeal Nasopharyngeal Swab     Status: None   Collection Time: 09/10/20 10:42 PM   Specimen: Nasopharyngeal Swab  Result Value Ref Range Status   SARS Coronavirus 2 NEGATIVE NEGATIVE Final    Comment: (NOTE) SARS-CoV-2 target nucleic acids are NOT DETECTED.  The SARS-CoV-2 RNA is generally detectable in upper and lower respiratory specimens during the acute phase of infection. Negative results do not preclude SARS-CoV-2 infection, do not rule out co-infections with other pathogens, and should not be used as the sole basis for treatment or other patient management decisions. Negative results must be combined with clinical observations, patient history, and epidemiological information. The expected result is Negative.  Fact Sheet for Patients: SugarRoll.be  Fact Sheet for Healthcare Providers: https://www.woods-mathews.com/  This test is not yet approved or cleared by the [place] FDA and  has been authorized for detection and/or diagnosis of SARS-CoV-2 by FDA under an Emergency Use Authorization (EUA). This EUA will remain  in effect (meaning this test can be used) for the duration of the COVID-19 declaration under Se ction 564(b)(1) of the Act, 21 U.S.C. section 360bbb-3(b)(1), unless the authorization  is terminated or revoked sooner.  Performed at [facility], [place] [address] [place], [place] [address]      Radiology Studies: DG CHEST PORT 1 VIEW  Result Date: 09/13/2020 CLINICAL DATA:  Short of breath EXAM: PORTABLE CHEST 1 VIEW COMPARISON:  09/11/2020 FINDINGS: Single frontal view of the chest demonstrates a stable cardiac silhouette. No airspace disease, effusion, or  pneumothorax. No acute bony abnormalities. IMPRESSION: 1. Stable chest, no acute process. Electronically Signed   By: Randa Ngo M.D.   On: 09/13/2020 22:40    Scheduled Meds: . (feeding supplement) PROSource Plus  30 mL Oral BID BM  . acyclovir  200 mg Oral BID  . amLODipine  2.5 mg Oral Daily  . carvedilol  6.25 mg Oral BID WC  . cycloSPORINE  1 drop Both Eyes BID  . dexamethasone (DECADRON) injection  10 mg Intravenous Weekly  . enoxaparin (LOVENOX) injection  30 mg Subcutaneous Q24H  . feeding supplement (GLUCERNA SHAKE)  237 mL Oral Q24H  . insulin aspart  0-9 Units Subcutaneous TID WC  . multivitamin with minerals  1 tablet Oral Daily  . pantoprazole  40 mg Oral Daily  . rosuvastatin  10 mg Oral Daily  . sodium bicarbonate  650 mg Oral BID  . sodium chloride flush  3 mL Intravenous Q12H   Continuous Infusions: . sodium chloride 75 mL/hr at 09/14/20 1207     LOS: 3 days   Marylu Lund, MD [facility] Pager On Amion  If 7PM-7AM, please contact night-coverage 09/14/2020, 3:01 PM

## 2020-09-15 ENCOUNTER — Encounter (HOSPITAL_COMMUNITY): Payer: Self-pay | Admitting: Internal Medicine

## 2020-09-15 ENCOUNTER — Inpatient Hospital Stay: Payer: Medicare Other | Admitting: Hematology and Oncology

## 2020-09-15 ENCOUNTER — Inpatient Hospital Stay (HOSPITAL_COMMUNITY): Payer: Medicare Other

## 2020-09-15 ENCOUNTER — Encounter (HOSPITAL_COMMUNITY)
Admission: EM | Disposition: A | Discharge status: Hospice | Payer: Self-pay | Source: Home / Self Care | Attending: Internal Medicine

## 2020-09-15 ENCOUNTER — Inpatient Hospital Stay (HOSPITAL_COMMUNITY): Payer: Medicare Other | Admitting: Anesthesiology

## 2020-09-15 ENCOUNTER — Other Ambulatory Visit (HOSPITAL_COMMUNITY): Payer: Medicare Other

## 2020-09-15 ENCOUNTER — Inpatient Hospital Stay: Payer: Medicare Other

## 2020-09-15 DIAGNOSIS — R4182 Altered mental status, unspecified: Secondary | ICD-10-CM

## 2020-09-15 DIAGNOSIS — M7989 Other specified soft tissue disorders: Secondary | ICD-10-CM | POA: Diagnosis not present

## 2020-09-15 DIAGNOSIS — I97610 Postprocedural hemorrhage and hematoma of a circulatory system organ or structure following a cardiac catheterization: Secondary | ICD-10-CM

## 2020-09-15 DIAGNOSIS — N178 Other acute kidney failure: Secondary | ICD-10-CM | POA: Diagnosis not present

## 2020-09-15 DIAGNOSIS — S45192A Other specified injury of brachial artery, left side, initial encounter: Secondary | ICD-10-CM

## 2020-09-15 DIAGNOSIS — N179 Acute kidney failure, unspecified: Secondary | ICD-10-CM | POA: Diagnosis not present

## 2020-09-15 DIAGNOSIS — M79A12 Nontraumatic compartment syndrome of left upper extremity: Secondary | ICD-10-CM | POA: Diagnosis not present

## 2020-09-15 HISTORY — PX: FASCIOTOMY: SHX132

## 2020-09-15 HISTORY — PX: APPLICATION OF WOUND VAC: SHX5189

## 2020-09-15 HISTORY — PX: HEMATOMA EVACUATION: SHX5118

## 2020-09-15 LAB — CBC
HCT: 16.5 % — ABNORMAL LOW (ref 36.0–46.0)
HCT: 17.8 % — ABNORMAL LOW (ref 36.0–46.0)
Hemoglobin: 5.4 g/dL — CL (ref 12.0–15.0)
Hemoglobin: 5.6 g/dL — CL (ref 12.0–15.0)
MCH: 28.7 pg (ref 26.0–34.0)
MCH: 28.4 pg (ref 26.0–34.0)
MCHC: 32.7 g/dL (ref 30.0–36.0)
MCHC: 31.5 g/dL (ref 30.0–36.0)
MCV: 87.8 fL (ref 80.0–100.0)
MCV: 90.4 fL (ref 80.0–100.0)
Platelets: 101 10*3/uL — ABNORMAL LOW (ref 150–400)
Platelets: 101 10*3/uL — ABNORMAL LOW (ref 150–400)
RBC: 1.88 MIL/uL — ABNORMAL LOW (ref 3.87–5.11)
RBC: 1.97 MIL/uL — ABNORMAL LOW (ref 3.87–5.11)
RDW: 19.9 % — ABNORMAL HIGH (ref 11.5–15.5)
RDW: 20.2 % — ABNORMAL HIGH (ref 11.5–15.5)
WBC: 18.8 10*3/uL — ABNORMAL HIGH (ref 4.0–10.5)
WBC: 21.7 10*3/uL — ABNORMAL HIGH (ref 4.0–10.5)
nRBC: 0.5 % — ABNORMAL HIGH (ref 0.0–0.2)
nRBC: 0.6 % — ABNORMAL HIGH (ref 0.0–0.2)

## 2020-09-15 LAB — COMPREHENSIVE METABOLIC PANEL
ALT: 32 U/L (ref 0–44)
AST: 59 U/L — ABNORMAL HIGH (ref 15–41)
Albumin: 3.1 g/dL — ABNORMAL LOW (ref 3.5–5.0)
Alkaline Phosphatase: 56 U/L (ref 38–126)
Anion gap: 6 (ref 5–15)
BUN: 32 mg/dL — ABNORMAL HIGH (ref 8–23)
CO2: 20 mmol/L — ABNORMAL LOW (ref 22–32)
Calcium: 7.6 mg/dL — ABNORMAL LOW (ref 8.9–10.3)
Chloride: 114 mmol/L — ABNORMAL HIGH (ref 98–111)
Creatinine, Ser: 1.88 mg/dL — ABNORMAL HIGH (ref 0.44–1.00)
GFR, Estimated: 26 mL/min — ABNORMAL LOW (ref >60)
Glucose, Bld: 218 mg/dL — ABNORMAL HIGH (ref 70–99)
Potassium: 4.9 mmol/L (ref 3.5–5.1)
Sodium: 140 mmol/L (ref 135–145)
Total Bilirubin: 0.4 mg/dL (ref 0.3–1.2)
Total Protein: 5.6 g/dL — ABNORMAL LOW (ref 6.5–8.1)

## 2020-09-15 LAB — APTT: aPTT: 56 seconds — ABNORMAL HIGH (ref 24–36)

## 2020-09-15 LAB — POCT I-STAT, CHEM 8
BUN: 33 mg/dL — ABNORMAL HIGH (ref 8–23)
Calcium, Ion: 1.08 mmol/L — ABNORMAL LOW (ref 1.15–1.40)
Chloride: 113 mmol/L — ABNORMAL HIGH (ref 98–111)
Creatinine, Ser: 2.3 mg/dL — ABNORMAL HIGH (ref 0.44–1.00)
Glucose, Bld: 198 mg/dL — ABNORMAL HIGH (ref 70–99)
HCT: 15 % — ABNORMAL LOW (ref 36.0–46.0)
Hemoglobin: 5.1 g/dL — CL (ref 12.0–15.0)
Potassium: 5.1 mmol/L (ref 3.5–5.1)
Sodium: 143 mmol/L (ref 135–145)
TCO2: 22 mmol/L (ref 22–32)

## 2020-09-15 LAB — GLUCOSE, CAPILLARY
Glucose-Capillary: 193 mg/dL — ABNORMAL HIGH (ref 70–99)
Glucose-Capillary: 186 mg/dL — ABNORMAL HIGH (ref 70–99)
Glucose-Capillary: 193 mg/dL — ABNORMAL HIGH (ref 70–99)
Glucose-Capillary: 199 mg/dL — ABNORMAL HIGH (ref 70–99)

## 2020-09-15 LAB — MAGNESIUM: Magnesium: 1.8 mg/dL (ref 1.7–2.4)

## 2020-09-15 LAB — BASIC METABOLIC PANEL
Anion gap: 9 (ref 5–15)
BUN: 31 mg/dL — ABNORMAL HIGH (ref 8–23)
CO2: 20 mmol/L — ABNORMAL LOW (ref 22–32)
Calcium: 7.7 mg/dL — ABNORMAL LOW (ref 8.9–10.3)
Chloride: 113 mmol/L — ABNORMAL HIGH (ref 98–111)
Creatinine, Ser: 2.13 mg/dL — ABNORMAL HIGH (ref 0.44–1.00)
GFR, Estimated: 22 mL/min — ABNORMAL LOW (ref >60)
Glucose, Bld: 224 mg/dL — ABNORMAL HIGH (ref 70–99)
Potassium: 5.4 mmol/L — ABNORMAL HIGH (ref 3.5–5.1)
Sodium: 142 mmol/L (ref 135–145)

## 2020-09-15 LAB — PREPARE RBC (CROSSMATCH)

## 2020-09-15 LAB — TYPE AND SCREEN
ABO/RH(D): A POS
Antibody Screen: NEGATIVE

## 2020-09-15 LAB — ABO/RH: ABO/RH(D): A POS

## 2020-09-15 LAB — PROTIME-INR
INR: 1.4 — ABNORMAL HIGH (ref 0.8–1.2)
Prothrombin Time: 16.7 seconds — ABNORMAL HIGH (ref 11.4–15.2)

## 2020-09-15 SURGERY — EVACUATION HEMATOMA
Anesthesia: General | Site: Arm Lower | Laterality: Left

## 2020-09-15 MED ORDER — SODIUM CHLORIDE 0.9 % IV SOLN
INTRAVENOUS | Status: AC
  Filled 2020-09-15: qty 1.2

## 2020-09-15 MED ORDER — OXYCODONE-ACETAMINOPHEN 5-325 MG PO TABS
1.0000 | ORAL_TABLET | ORAL | Status: DC | PRN
Start: 2020-09-15 — End: 2020-09-19

## 2020-09-15 MED ORDER — VANCOMYCIN HCL IN DEXTROSE 1-5 GM/200ML-% IV SOLN
INTRAVENOUS | Status: AC
  Filled 2020-09-15: qty 200

## 2020-09-15 MED ORDER — LACTATED RINGERS IV SOLN: INTRAVENOUS | Status: DC | PRN

## 2020-09-15 MED ORDER — MORPHINE SULFATE (PF) 2 MG/ML IV SOLN
1.0000 mg | INTRAVENOUS | Status: AC
  Administered 2020-09-15: 1 mg via INTRAVENOUS
  Filled 2020-09-15: qty 1

## 2020-09-15 MED ORDER — LABETALOL HCL 5 MG/ML IV SOLN
INTRAVENOUS | Status: AC
  Administered 2020-09-15: 5 mg
  Filled 2020-09-15: qty 4

## 2020-09-15 MED ORDER — LABETALOL HCL 5 MG/ML IV SOLN: 5.0000 mg | Freq: Once | INTRAVENOUS | Status: DC

## 2020-09-15 MED ORDER — SODIUM CHLORIDE 0.9 % IV SOLN
INTRAVENOUS | Status: DC | PRN
  Administered 2020-09-15: 500 mL

## 2020-09-15 MED ORDER — ROCURONIUM BROMIDE 10 MG/ML (PF) SYRINGE
PREFILLED_SYRINGE | INTRAVENOUS | Status: DC | PRN
  Administered 2020-09-15: 30 mg via INTRAVENOUS
  Administered 2020-09-15: 25 mg via INTRAVENOUS

## 2020-09-15 MED ORDER — CHLORHEXIDINE GLUCONATE 0.12 % MT SOLN
15.0000 mL | Freq: Once | OROMUCOSAL | Status: AC
  Filled 2020-09-15: qty 15

## 2020-09-15 MED ORDER — IPRATROPIUM-ALBUTEROL 0.5-2.5 (3) MG/3ML IN SOLN: 3.0000 mL | Freq: Once | RESPIRATORY_TRACT | Status: DC

## 2020-09-15 MED ORDER — FENTANYL CITRATE (PF) 100 MCG/2ML IJ SOLN
25.0000 ug | INTRAMUSCULAR | Status: DC | PRN
Start: 2020-09-15 — End: 2020-09-15

## 2020-09-15 MED ORDER — FENTANYL CITRATE (PF) 100 MCG/2ML IJ SOLN
INTRAMUSCULAR | Status: DC | PRN
  Administered 2020-09-15 (×2): 25 ug via INTRAVENOUS

## 2020-09-15 MED ORDER — PROPOFOL 10 MG/ML IV BOLUS
INTRAVENOUS | Status: DC | PRN
  Administered 2020-09-15: 50 mg via INTRAVENOUS

## 2020-09-15 MED ORDER — SODIUM CHLORIDE 0.9 % IV SOLN: INTRAVENOUS | Status: DC | PRN

## 2020-09-15 MED ORDER — ONDANSETRON HCL 4 MG/2ML IJ SOLN
INTRAMUSCULAR | Status: DC | PRN
  Administered 2020-09-15: 4 mg via INTRAVENOUS

## 2020-09-15 MED ORDER — SODIUM CHLORIDE 0.9% IV SOLUTION: Freq: Once | INTRAVENOUS | Status: DC

## 2020-09-15 MED ORDER — PHENYLEPHRINE HCL (PRESSORS) 10 MG/ML IV SOLN
INTRAVENOUS | Status: DC | PRN
  Administered 2020-09-15: 120 ug via INTRAVENOUS

## 2020-09-15 MED ORDER — DEXAMETHASONE SODIUM PHOSPHATE 10 MG/ML IJ SOLN
INTRAMUSCULAR | Status: DC | PRN
  Administered 2020-09-15: 4 mg via INTRAVENOUS

## 2020-09-15 MED ORDER — CHLORHEXIDINE GLUCONATE 0.12 % MT SOLN
OROMUCOSAL | Status: AC
  Administered 2020-09-15: 15 mL via OROMUCOSAL
  Filled 2020-09-15: qty 15

## 2020-09-15 MED ORDER — VANCOMYCIN HCL 1000 MG IV SOLR
INTRAVENOUS | Status: DC | PRN
  Administered 2020-09-15: 1000 mg via INTRAVENOUS

## 2020-09-15 MED ORDER — PROMETHAZINE HCL 25 MG/ML IJ SOLN: 6.2500 mg | INTRAMUSCULAR | Status: DC | PRN

## 2020-09-15 MED ORDER — SUGAMMADEX SODIUM 200 MG/2ML IV SOLN
INTRAVENOUS | Status: DC | PRN
  Administered 2020-09-15: 120 mg via INTRAVENOUS

## 2020-09-15 MED ORDER — SODIUM CHLORIDE 0.9% IV SOLUTION: Freq: Once | INTRAVENOUS | Status: AC

## 2020-09-15 MED ORDER — PHENYLEPHRINE HCL-NACL 10-0.9 MG/250ML-% IV SOLN
INTRAVENOUS | Status: DC | PRN
  Administered 2020-09-15: 50 ug/min via INTRAVENOUS

## 2020-09-15 MED ORDER — IPRATROPIUM-ALBUTEROL 0.5-2.5 (3) MG/3ML IN SOLN
RESPIRATORY_TRACT | Status: AC
  Administered 2020-09-15: 3 mL
  Filled 2020-09-15: qty 3

## 2020-09-15 MED ORDER — LIDOCAINE 2% (20 MG/ML) 5 ML SYRINGE
INTRAMUSCULAR | Status: DC | PRN
  Administered 2020-09-15: 60 mg via INTRAVENOUS

## 2020-09-15 MED ORDER — PROPOFOL 10 MG/ML IV BOLUS
INTRAVENOUS | Status: AC
  Filled 2020-09-15: qty 20

## 2020-09-15 MED ORDER — SODIUM CHLORIDE 0.9 % IR SOLN
Status: DC | PRN
  Administered 2020-09-15: 1

## 2020-09-15 MED ORDER — FENTANYL CITRATE (PF) 250 MCG/5ML IJ SOLN
INTRAMUSCULAR | Status: AC
  Filled 2020-09-15: qty 5

## 2020-09-15 SURGICAL SUPPLY — 41 items
ADH SKN CLS APL DERMABOND .7 (GAUZE/BANDAGES/DRESSINGS)
BANDAGE ESMARK 6X9 LF (GAUZE/BANDAGES/DRESSINGS) IMPLANT
BNDG CMPR 9X6 STRL LF SNTH (GAUZE/BANDAGES/DRESSINGS)
BNDG ELASTIC 4X5.8 VLCR STR LF (GAUZE/BANDAGES/DRESSINGS) ×1 IMPLANT
BNDG ESMARK 6X9 LF (GAUZE/BANDAGES/DRESSINGS)
BNDG GAUZE ELAST 4 BULKY (GAUZE/BANDAGES/DRESSINGS) ×1 IMPLANT
CANISTER SUCT 3000ML PPV (MISCELLANEOUS) ×2 IMPLANT
CANNULA VESSEL 3MM 2 BLNT TIP (CANNULA) ×1 IMPLANT
COVER PROBE W GEL 5X96 (DRAPES) ×1 IMPLANT
DERMABOND ADVANCED (GAUZE/BANDAGES/DRESSINGS)
DERMABOND ADVANCED .7 DNX12 (GAUZE/BANDAGES/DRESSINGS) IMPLANT
DRAIN SNY 10X20 3/4 PERF (WOUND CARE) IMPLANT
DRAPE ORTHO SPLIT 77X108 STRL (DRAPES)
DRAPE SURG ORHT 6 SPLT 77X108 (DRAPES) IMPLANT
DRSG XEROFORM 1X8 (GAUZE/BANDAGES/DRESSINGS) ×1 IMPLANT
ELECT REM PT RETURN 9FT ADLT (ELECTROSURGICAL) ×2
ELECTRODE REM PT RTRN 9FT ADLT (ELECTROSURGICAL) ×1 IMPLANT
EVACUATOR SILICONE 100CC (DRAIN) IMPLANT
GAUZE SPONGE 4X4 12PLY STRL LF (GAUZE/BANDAGES/DRESSINGS) ×1 IMPLANT
GLOVE BIO SURGEON STRL SZ7.5 (GLOVE) ×2 IMPLANT
GOWN STRL REUS W/ TWL LRG LVL3 (GOWN DISPOSABLE) ×3 IMPLANT
GOWN STRL REUS W/TWL LRG LVL3 (GOWN DISPOSABLE) ×6
KIT BASIN OR (CUSTOM PROCEDURE TRAY) ×2 IMPLANT
KIT TURNOVER KIT B (KITS) ×2 IMPLANT
LOOP VESSEL MINI RED (MISCELLANEOUS) ×1 IMPLANT
NS IRRIG 1000ML POUR BTL (IV SOLUTION) ×4 IMPLANT
PACK CV ACCESS (CUSTOM PROCEDURE TRAY) ×1 IMPLANT
PAD ARMBOARD 7.5X6 YLW CONV (MISCELLANEOUS) ×4 IMPLANT
SPLINT FIBERGLASS 4X30 (CAST SUPPLIES) ×1 IMPLANT
SPONGE LAP 18X18 RF (DISPOSABLE) ×1 IMPLANT
SPONGE SURGIFOAM ABS GEL 100 (HEMOSTASIS) IMPLANT
STAPLER VISISTAT 35W (STAPLE) ×1 IMPLANT
SUT ETHILON 3 0 PS 1 (SUTURE) IMPLANT
SUT PROLENE 5 0 C 1 24 (SUTURE) IMPLANT
SUT PROLENE 6 0 CC (SUTURE) ×3 IMPLANT
SUT VIC AB 3-0 SH 27 (SUTURE) ×4
SUT VIC AB 3-0 SH 27X BRD (SUTURE) IMPLANT
SUT VIC AB 4-0 PS2 27 (SUTURE) ×2 IMPLANT
TOWEL GREEN STERILE (TOWEL DISPOSABLE) ×2 IMPLANT
UNDERPAD 30X36 HEAVY ABSORB (UNDERPADS AND DIAPERS) ×2 IMPLANT
WATER STERILE IRR 1000ML POUR (IV SOLUTION) ×2 IMPLANT

## 2020-09-15 NOTE — Transfer of Care (Signed)
Immediate Anesthesia Transfer of Care Note  Patient: Madeline Burke  Procedure(s) Performed: EXPLORATION OF LEFT BRACHIAL ARTERY (Left Arm Lower) APPLICATION OF WOUND VAC (Left Arm Lower) LIMITED DORSO-LATERAL LEFT ARM FASCIOTOMY (Left Arm Lower)  Patient Location: PACU  Anesthesia Type:General  Level of Consciousness: drowsy  Airway & Oxygen Therapy: Patient Spontanous Breathing and Patient connected to nasal cannula oxygen  Post-op Assessment: Report given to RN and Post -op Vital signs reviewed and stable  Post vital signs: Reviewed and stable  Last Vitals:  Vitals Value Taken Time  BP    Temp    Pulse 83 09/15/20 1614  Resp 14 09/15/20 1614  SpO2 100 % 09/15/20 1614  Vitals shown include unvalidated device data.  Last Pain:  Vitals:   09/15/20 1245  TempSrc: Oral  PainSc:       Patients Stated Pain Goal: 1 (33/58/25 1898)  Complications: No complications documented.

## 2020-09-15 NOTE — Anesthesia Preprocedure Evaluation (Signed)
Anesthesia Evaluation  Patient identified by MRN, date of birth, ID band Patient awake    Reviewed: Allergy & Precautions, NPO status , Patient's Chart, lab work & pertinent test results  Airway Mallampati: II  TM Distance: >3 FB Neck ROM: Full    Dental  (+) Dental Advisory Given   Pulmonary neg pulmonary ROS,    breath sounds clear to auscultation       Cardiovascular hypertension, Pt. on medications and Pt. on home beta blockers  Rhythm:Regular Rate:Normal     Neuro/Psych negative neurological ROS     GI/Hepatic negative GI ROS, Neg liver ROS,   Endo/Other  diabetes, Type 2  Renal/GU Renal disease     Musculoskeletal   Abdominal   Peds  Hematology  (+) anemia ,   Anesthesia Other Findings   Reproductive/Obstetrics                             Anesthesia Physical Anesthesia Plan  ASA: IV and emergent  Anesthesia Plan: General   Post-op Pain Management:    Induction: Intravenous and Rapid sequence  PONV Risk Score and Plan: 3 and Ondansetron, Dexamethasone and Treatment may vary due to age or medical condition  Airway Management Planned: Oral ETT  Additional Equipment: None  Intra-op Plan:   Post-operative Plan: Extubation in OR  Informed Consent: I have reviewed the patients History and Physical, chart, labs and discussed the procedure including the risks, benefits and alternatives for the proposed anesthesia with the patient or authorized representative who has indicated his/her understanding and acceptance.     Dental advisory given  Plan Discussed with: CRNA  Anesthesia Plan Comments:         Anesthesia Quick Evaluation

## 2020-09-15 NOTE — Progress Notes (Signed)
Attempted EEG, pt not in room yet

## 2020-09-15 NOTE — Progress Notes (Signed)
Ring returned to daughter at bedside.

## 2020-09-15 NOTE — Progress Notes (Signed)
Patient's left arm had increased to 36 cm overnight, radial pulse weak, capillary refills less than 3 seconds, arm is tight, swollen, painful to touch and same temperature as the right arm.  Paged vascular lab twice at [phone]  and left call back number to this RN phone [phone]. Notified NP Bl[place] to modify US doppler to STAT. Pain med given, elevate arm and ice applied.  Daughter Charlett Blake at bedside. Will continue to monitor

## 2020-09-15 NOTE — Progress Notes (Signed)
Asked emergently to see patient in the OR for fasciotomy by Dr Oneida Alar - Vascular Sx.  Pt underwent brachial artery exploration/repair this afternoon, forearm compartment significantly swollen and tense - in need of fasciotmy. On exam pt most swollen/tense in mobile wad dorsolateral compartment.  Volar and dorsal compartments swollen but soft.  Hand with swelling but soft. Fasciotomy performed - see OP note.

## 2020-09-15 NOTE — Progress Notes (Signed)
Date and time results received: 09/15/20 0525   Test: HGB Critical Value: 5.4  Name of Provider Notified: Kennon Holter NP  Orders Received? Or Actions Taken?: Orders Received - See Orders for details

## 2020-09-15 NOTE — Progress Notes (Signed)
Velcade on hold per Dr. Lorenso Courier. MD will let us know when he plans to resume therapy.  Raul Del [place], [place], BCPS, BCOP 09/15/2020 3:29 PM

## 2020-09-15 NOTE — Care Management Important Message (Signed)
Important Message  Patient Details  IM Letter given to the Patient. Name: Madeline Burke MRN: [id number] Date of Birth: 12/28/1933   Medicare Important Message Given:  Yes     Kerin Salen 09/15/2020, 12:20 PM

## 2020-09-15 NOTE — Progress Notes (Signed)
PROGRESS NOTE    Madeline Burke  IOX:[id number] DOB: 1934-03-13 DOA: 09/10/2020 PCP: Leeroy Cha, MD    Brief Narrative:  87 y.o. female with medical history significant of hypertension, hyperlipidemia, diabetes, angioedema, currently being worked up for elevated immunoglobulin levels who presents with abdominal pain.             Patient has had some intermittent abdominal pain for months, first starting in January or February. There was a CT scan in February which did not show exhalation for the pain.             She was noted to have the pain during her oncology visit in March and it was believed not to be related to her elevated immunoglobulin levels and her work-up from their perspective.             She is continued to have waxing and waning abdominal pain.  The pain is difficult to describe but is epigastric per patient.  She states that it does seem to improve with food and her daughter states that it does seem to improve whenever she is taken Pepto-Bismol for diarrhea.  They have not noticed any other aggravating or alleviating factors. In the ED, pt was found to have evidence of ARF with Cr of 2.5, up from 1  Assessment & Plan:   Principal Problem:   AKI (acute kidney injury) ([place]) Active Problems:   Abdominal pain, epigastric   HTN (hypertension)   Diabetes ([place])   Anemia   ARF (acute renal failure) ([place])   Acute renal failure > Creatinine elevated to 2.5 up from 1.92 weeks ago which is up from 1 previous to that. > This is in the setting of her current work-up for elevated immunoglobulin levels which may be due to multiple myeloma, she is following with heme-onc for this. - Continued with IVF hydration as tolerated - Continue to avoid nephrotoxic agents -Appreciate input by Hematology. Concerns for acute renal failure related to myeloma, now on steroids and had started Velcade - Cr stable thus far at 1.88 -Repeat bmet in AM  Metabolic acidosis -Abg from  overnight reviewed, findings notable for pH 7.2, unremarkable pCO2. -on BMET, serum bicarb noted to be 16 -Lactate is normal -Suspect lactate secondary to renal failure related to myeloma -Now on scheduled sodium bicarb  Abdominal pain > Patient has had some previous episodes abdominal pain the last few months.  In her evaluation by heme-onc they did not believe this is related to that process in beginning of this month. > CT in February did not show explanation for the pain. > Pain has improved with pain medication in the ED > Ultrasound evaluation of aorta showed only 3 cm AAA with recommendation for 3-year follow-up. > Normal lipase > Given other negative findings and epigastric pain that is relieved with food and Pepto-Bismol.  Concern is for peptic ulcer or other gastritis. - Family reports prior GI workup, notably EGD, by [facility] as being unremarkable - will continue on PPI - Continue supportive care for now  Syncope > Patient has reportedly had a couple episodes of syncope in the setting of her severe pain. > Initially suspected possible vasovagal or neurocardiogenic given the setting around her pain. - VQ scan ordered in ED given elevated D-dimer, reviewed and is low prob for PE -Recently, pt had syncopal episode with noted hypoexemia and bradycardia -Evidence of tongue biting noted on exam. Initial head CT reviewed, unremarkable -Follow up MRI reviewed, neg.  Echo reviewed, normal LVEF with unremarkable valvular structures -Continue with seizure precautions -Cont with PRN ativan for seizures -EEG is pending  Recently diagnosed Multiple Myeloma - Appreciate input by Hematology -Recommendation to initiate steroids and Velcade -Bone marrow biopsy reviewed, findings were noted to be consistent with myeloma -Next dose of Velcate 3/28  Chronic Anemia related to Multiple myeloma with acute blood loss anemia > Gradually worsening anemia in the setting of her work-up for her  elevated immunoglobulin level. > Hgb had remained stable just under 8 - overnight, hgb trended down to 5.4, see below -1 unit PRBC was ordered overnight -Would continue to follow CBC trends and transfuse as needed  Hypertension - Continue Coreg and amlodipine - BP stable at present -Coreg dose was recently decreased to 6.58m given concerns of bradycardia overnight  Hyperlipidemia - Continue home rosuvastatin as tolerated  Diabetes - continue SSI coverage while in hosptial - Glucose trends are overall stable  L Arm swelling -New event -L Arm with decreased pulse on palpation, positive pulse on dopplers -Hgb down to 5.4, raising concern for underlying hematoma -Have discussed case with Vascular Surgery who recommended arterial [place]of L arm -[facility] -Would hold further anticoagulants/antiplatelets  DVT prophylaxis: Lovenox subq discontinued, SCD's ordered Code Status: Full Family Communication: Pt in room, family is currently at bedside  Status is: Inpatient  Pt requires continued inpatient stay because:: IV treatments appropriate due to intensity of illness or inability to take PO and Inpatient level of care appropriate due to severity of illness  Dispo: The patient is from: Home              Anticipated d/c is to: Home              Patient currently is not medically stable to d/c.   Difficult to place patient No   Consultants:   Hematology  Vascular Surgery  Procedures:     Antimicrobials: Anti-infectives (From admission, onward)   Start     Dose/Rate Route Frequency Ordered Stop   09/12/20 2200  acyclovir (ZOVIRAX) 200 MG capsule 200 mg        200 mg Oral 2 times daily 09/12/20 1356        Subjective: Complaining of worsening L arm pain and swelling  Objective: Vitals:   09/14/20 1231 09/14/20 1312 09/14/20 2056 09/15/20 0631  BP:  (!) 107/53 135/60 (!) 139/57  Pulse:  60 75 76  Resp:   _0 Temp:  98.7 F (37.1 C) 98.2 F (36.8 C) (!) 97.5 F (36.4 C)  TempSrc:  Oral Oral Oral  SpO2: 100% 100% 99% 100%  Weight:      Height:        Intake/Output Summary (Last 24 hours) at 09/15/2020 [id number] data filed at 09/15/2020 0630 Gross per 24 hour  Intake 694.7 ml  Output 1050 ml  Net -355.3 ml   Filed Weights   09/10/20 1829  Weight: 56.7 kg    Examination: General exam: Conversant, in no acute distress Respiratory system: normal chest rise, clear, no audible wheezing Cardiovascular system: regular rhythm, s1-s2 Gastrointestinal system: Nondistended, nontender, pos BS Central nervous system: No seizures, no tremors Extremities: No cyanosis, no joint deformities, L arm with marked swelling most prominent in the mid-arm, decreased pulse Skin: No rashes, no pallor Psychiatry: Affect normal // no auditory hallucinations    Data Reviewed: I have personally reviewed following labs and imaging  studies  CBC: Recent Labs  Lab 09/10/20 1910 09/11/20 0527 09/12/20 0521 09/13/20 0619 09/13/20 2325 09/14/20 0516 09/15/20 0443  WBC 7.1   < > 4.5 4.6 8.4 8.3 18.8*  NEUTROABS 1.8  --   --   --  5.0  --   --   HGB 8.6*   < > 7.7* 7.4* 7.0* 7.2* 5.4*  HCT 26.7*   < > 24.8* 24.4* 22.1* 23.0* 16.5*  MCV 89.9   < > 91.5 92.4 88.8 92.4 87.8  PLT 139*   < > 116* 108* 110* 100* 101*   < > = values in this interval not displayed.   Basic Metabolic Panel: Recent Labs  Lab 09/12/20 0521 09/13/20 0619 09/13/20 2325 09/14/20 0516 09/15/20 0443  NA 141 141 138 143 140  K 4.2 4.7 4.3 4.3 4.9  CL 112* 115* 113* 116* 114*  CO2 19* 18* 17* 16* 20*  GLUCOSE 66* 218* 252* 145* 218*  BUN 18 21 27* 27* 32*  CREATININE 2.32* 2.09* 1.86* 1.83* 1.88*  CALCIUM 8.4* 8.1* 7.8* 8.2* 7.6*  MG  --   --   --   --  1.8   GFR: Estimated Creatinine Clearance: 17.3 mL/min (A) (by C-G formula based on SCr of 1.88 mg/dL (H)). Liver Function Tests: Recent Labs  Lab 09/12/20 0521  09/13/20 0619 09/13/20 2325 09/14/20 0516 09/15/20 0443  AST 19 24 91* 91* 59*  ALT 12 13 38 33 32  ALKPHOS 47 46 52 55 56  BILITOT 0.7 0.4 0.6 0.6 0.4  PROT 6.2* 6.2* 6.4* 6.4* 5.6*  ALBUMIN 3.6 3.6 3.8 3.7 3.1*   Recent Labs  Lab 09/10/20 1910  LIPASE 51   No results for input(s): AMMONIA in the last 168 hours. Coagulation Profile: No results for input(s): INR, PROTIME in the last 168 hours. Cardiac Enzymes: No results for input(s): CKTOTAL, CKMB, CKMBINDEX, TROPONINI in the last 168 hours. BNP (last 3 results) No results for input(s): PROBNP in the last 8760 hours. HbA1C: No results for input(s): HGBA1C in the last 72 hours. CBG: Recent Labs  Lab 09/14/20 1106 09/14/20 1706 09/14/20 2059 09/14/20 2212 09/15/20 0738  GLUCAP 121* 140* 118* 174* 193*   Lipid Profile: No results for input(s): CHOL, HDL, LDLCALC, TRIG, CHOLHDL, LDLDIRECT in the last 72 hours. Thyroid Function Tests: No results for input(s): TSH, T4TOTAL, FREET4, T3FREE, THYROIDAB in the last 72 hours. Anemia Panel: No results for input(s): VITAMINB12, FOLATE, FERRITIN, TIBC, IRON, RETICCTPCT in the last 72 hours. Sepsis Labs: Recent Labs  Lab 09/14/20 1251 09/14/20 1541  LATICACIDVEN 1.3 1.1    Recent Results (from the past 240 hour(s))  SARS CORONAVIRUS 2 (TAT 6-24 HRS) Nasopharyngeal Nasopharyngeal Swab     Status: None   Collection Time: 09/10/20 10:42 PM   Specimen: Nasopharyngeal Swab  Result Value Ref Range Status   SARS Coronavirus 2 NEGATIVE NEGATIVE Final    Comment: (NOTE) SARS-CoV-2 target nucleic acids are NOT DETECTED.  The SARS-CoV-2 RNA is generally detectable in upper and lower respiratory specimens during the acute phase of infection. Negative results do not preclude SARS-CoV-2 infection, do not rule out co-infections with other pathogens, and should not be used as the sole basis for treatment or other patient management decisions. Negative results must be combined with  clinical observations, patient history, and epidemiological information. The expected result is Negative.  Fact Sheet for Patients: SugarRoll.be  Fact Sheet for Healthcare Providers: https://www.woods-mathews.com/  This test is not yet approved or cleared  by the [place] and  has been authorized for detection and/or diagnosis of SARS-CoV-2 by FDA under an Emergency Use Authorization (EUA). This EUA will remain  in effect (meaning this test can be used) for the duration of the COVID-19 declaration under Se ction 564(b)(1) of the Act, 21 U.S.C. section 360bbb-3(b)(1), unless the authorization is terminated or revoked sooner.  Performed at [facility], [place] [address], [place], [place] [address]      Radiology Studies: MR BRAIN WO CONTRAST  Result Date: 09/14/2020 CLINICAL DATA:  Seizure EXAM: MRI HEAD WITHOUT CONTRAST TECHNIQUE: Multiplanar, multiecho pulse sequences of the brain and surrounding structures were obtained without intravenous contrast. COMPARISON:  None. FINDINGS: Motion artifact is present. Brain: There is no acute infarction or intracranial hemorrhage. There is no intracranial mass, mass effect, or edema. There is no hydrocephalus or extra-axial fluid collection. Prominence of the ventricles and sulci reflects generalized parenchymal volume loss. Foci of T2 hyperintensity in the supratentorial and pontine white matter are nonspecific but probably reflect moderate chronic microvascular ischemic changes. Vascular: Major vessel flow voids at the skull base are preserved. Skull and upper cervical spine: Normal marrow signal is preserved. Sinuses/Orbits: Paranasal sinuses are aerated. Bilateral lens replacements. Other: Sella is partially empty.  Mastoid air cells are clear. IMPRESSION: No evidence of recent infarction, hemorrhage, or mass. Moderate chronic microvascular ischemic changes. Electronically Signed   By: Macy Mis M.D.   On: 09/14/2020 18:25   DG CHEST PORT 1 VIEW  Result Date: 09/13/2020 CLINICAL DATA:  Short of breath EXAM: PORTABLE CHEST 1 VIEW COMPARISON:  09/11/2020 FINDINGS: Single frontal view of the chest demonstrates a stable cardiac silhouette. No airspace disease, effusion, or pneumothorax. No acute bony abnormalities. IMPRESSION: 1. Stable chest, no acute process. Electronically Signed   By: Randa Ngo M.D.   On: 09/13/2020 22:40   ECHOCARDIOGRAM COMPLETE  Result Date: 09/14/2020    ECHOCARDIOGRAM REPORT   Patient Name:   Madeline Burke Date of Exam: 09/14/2020 Medical Rec #:  [id number]      Height:       62.0 in Accession #:    [id number]     Weight:       125.0 lb Date of Birth:  1933/08/10      BSA:          1.566 m Patient Age:    8 years       BP:           107/53 mmHg Patient Gender: F              HR:           60 bpm. Exam Location:  Inpatient Procedure: Cardiac Doppler, Color Doppler and Strain Analysis Indications:    Syncope R55  History:        Patient has no prior history of Echocardiogram examinations.                 Risk Factors:Hypertension, Diabetes and Dyslipidemia. Multiple                 myeloma with renal failure.  Sonographer:    [name] RDCS Referring Phys: [place]  1. Left ventricular ejection fraction, by estimation, is 60 to 65%. Left ventricular ejection fraction by 3D volume is 60 %. The left ventricle has normal function. The left ventricle has no regional wall motion abnormalities. Left ventricular diastolic  parameters were normal. The average left  ventricular global longitudinal strain is -20.6 %. The global longitudinal strain is normal.  2. Right ventricular systolic function is normal. The right ventricular size is normal. There is normal pulmonary artery systolic pressure. The estimated right ventricular systolic pressure is 88.4 mmHg.  3. The mitral valve is grossly normal. Trivial mitral valve regurgitation. No evidence of  mitral stenosis.  4. The aortic valve is tricuspid. Aortic valve regurgitation is not visualized. No aortic stenosis is present.  5. The inferior vena cava is dilated in size with >50% respiratory variability, suggesting right atrial pressure of 8 mmHg. Conclusion(s)/Recommendation(s): Normal biventricular function without evidence of hemodynamically significant valvular heart disease. FINDINGS  Left Ventricle: Left ventricular ejection fraction, by estimation, is 60 to 65%. Left ventricular ejection fraction by 3D volume is 60 %. The left ventricle has normal function. The left ventricle has no regional wall motion abnormalities. The average left ventricular global longitudinal strain is -20.6 %. The global longitudinal strain is normal. The left ventricular internal cavity size was normal in size. There is no left ventricular hypertrophy. Left ventricular diastolic parameters were normal. Right Ventricle: The right ventricular size is normal. No increase in right ventricular wall thickness. Right ventricular systolic function is normal. There is normal pulmonary artery systolic pressure. The tricuspid regurgitant velocity is 2.62 m/s, and  with an assumed right atrial pressure of 8 mmHg, the estimated right ventricular systolic pressure is 16.6 mmHg. Left Atrium: Left atrial size was normal in size. Right Atrium: Right atrial size was normal in size. Pericardium: Trivial pericardial effusion is present. Mitral Valve: The mitral valve is grossly normal. Trivial mitral valve regurgitation. No evidence of mitral valve stenosis. Tricuspid Valve: The tricuspid valve is grossly normal. Tricuspid valve regurgitation is mild . No evidence of tricuspid stenosis. Aortic Valve: The aortic valve is tricuspid. Aortic valve regurgitation is not visualized. No aortic stenosis is present. Pulmonic Valve: The pulmonic valve was grossly normal. Pulmonic valve regurgitation is trivial. No evidence of pulmonic stenosis. Aorta: The  aortic root and ascending aorta are structurally normal, with no evidence of dilitation. Venous: The right upper pulmonary vein is normal. The inferior vena cava is dilated in size with greater than 50% respiratory variability, suggesting right atrial pressure of 8 mmHg. IAS/Shunts: The atrial septum is grossly normal.  LEFT VENTRICLE PLAX 2D LVIDd:         4.20 cm         Diastology LVIDs:         2.60 cm         LV e' medial:    7.83 cm/s LV PW:         1.10 cm         LV E/e' medial:  10.2 LV IVS:        1.20 cm         LV e' lateral:   9.57 cm/s LVOT diam:     1.90 cm         LV E/e' lateral: 8.4 LV SV:         72 LV SV Index:   46              2D LVOT Area:     2.84 cm        Longitudinal                                Strain  2D Strain GLS  -20.6 %                                Avg:                                 3D Volume EF                                LV 3D EF:    Left                                             ventricular                                             ejection                                             fraction by                                             3D volume                                             is 60 %.                                 3D Volume EF:                                3D EF:        60 %                                LV EDV:       72 ml                                LV ESV:       29 ml                                LV SV:        43 ml RIGHT VENTRICLE RV S prime:     14.10 cm/s TAPSE (M-mode): 2.1 cm LEFT ATRIUM             Index       RIGHT ATRIUM           Index LA diam:        3.40  cm 2.17 cm/m  RA Area:     17.70 cm LA Vol (A2C):   33.6 ml 21.46 ml/m RA Volume:   47.00 ml  30.02 ml/m LA Vol (A4C):   57.2 ml 36.54 ml/m LA Biplane Vol: 48.3 ml 30.85 ml/m  AORTIC VALVE LVOT Vmax:   112.00 cm/s LVOT Vmean:  78.600 cm/s LVOT VTI:    0.255 m  AORTA Ao Root diam: 2.80 cm MITRAL VALVE               TRICUSPID VALVE MV Area  (PHT): 4.15 cm    TR Peak grad:   27.5 mmHg MV Decel Time: 183 msec    TR Vmax:        262.00 cm/s MV E velocity: 80.10 cm/s MV A velocity: 82.70 cm/s  SHUNTS MV E/A ratio:  0.97        Systemic VTI:  0.26 m                            Systemic Diam: 1.90 cm Eleonore Chiquito MD Electronically signed by Eleonore Chiquito MD Signature Date/Time: 09/14/2020/3:10:55 PM    Final     Scheduled Meds: . (feeding supplement) PROSource Plus  30 mL Oral BID BM  . sodium chloride   Intravenous Once  . acyclovir  200 mg Oral BID  . amLODipine  2.5 mg Oral Daily  . carvedilol  6.25 mg Oral BID WC  . cycloSPORINE  1 drop Both Eyes BID  . dexamethasone (DECADRON) injection  10 mg Intravenous Weekly  . enoxaparin (LOVENOX) injection  30 mg Subcutaneous Q24H  . feeding supplement (GLUCERNA SHAKE)  237 mL Oral Q24H  . insulin aspart  0-9 Units Subcutaneous TID WC  . multivitamin with minerals  1 tablet Oral Daily  . pantoprazole  40 mg Oral Daily  . rosuvastatin  10 mg Oral Daily  . sodium bicarbonate  650 mg Oral BID  . sodium chloride flush  3 mL Intravenous Q12H   Continuous Infusions:    LOS: 4 days   Marylu Lund, MD [facility] Pager On Amion  If 7PM-7AM, please contact night-coverage 09/15/2020, 8:03 AM

## 2020-09-15 NOTE — CV Procedure (Signed)
LUE arterial duplex completed. Preliminary findings discussed with Dr. Oneida Alar at [phone].  Results can be found under chart review under CV PROC. 09/15/2020 9:41 AM Johnathen Testa RVT, RDMS

## 2020-09-15 NOTE — OR Nursing (Signed)
Metal ring with letter 'M' monogram and stone removed from patient's left hand after intubation in OR. Ring placed in bag with patient label and taken to PACU on completion of procedure. PACU made aware.

## 2020-09-15 NOTE — Plan of Care (Signed)
  Problem: Education: Goal: Knowledge of General Education information will improve Description: Including pain rating scale, medication(s)/side effects and non-pharmacologic comfort measures Outcome: Progressing   Problem: Activity: Goal: Risk for activity intolerance will decrease Outcome: Progressing   Problem: Nutrition: Goal: Adequate nutrition will be maintained Outcome: Progressing   Problem: Coping: Goal: Level of anxiety will decrease Outcome: Progressing   

## 2020-09-15 NOTE — Op Note (Signed)
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  After commencing general anesthesia and endotracheal ovation the patient's left upper extremities prepped and draped in usual sterile fashion.  A longitudinal incision was made incorporating the area of the antecubital crease.  There was a large amount of blood staining of the tissues.  I carried the incision down the level of fascia.  The fascia was opened over the area of the brachial artery.  There was active ongoing bleeding.  I continued to dissect out the brachial artery and the fascia was extremely tense all the way up to the biceps muscle and extending into the forearm.  This was all opened longitudinally.  I was able to dissect out the brachial artery circumferentially a few centimeters above the area of injury.  There was a small needle size hole in the brachial artery and this was repaired with a 6-0 Prolene suture.  The wound was thoroughly irrigated normal saline solution.  Hemostasis was obtained.  The muscle was well decompressed on the biceps and into the medial forearm at this point.  The patient still had very tight compartment over the dorsal forearm and I called Dr. Lenon Curt from hand surgery to assist with a fasciotomy of this area.  This is dictated as a separate operative note.  After the forearm had been completely decompressed there were good Doppler signals in the radial ulnar and brachial artery.  Hemostasis was obtained.  The antecubital incision and the incision that  it made and extended to the mid biceps was closed with staples.  The skin was under a fair amount of tension over the area of the antecubital space.  Therefore placed a small VAC dressing on this and this was placed at 125 mm suction.  A ladder type dressing was placed on the dorsal forearm by Dr. Lenon Curt.  Patient was then placed in a splint and Ace wrap.  The patient tolerated procedure well and there were no complications.  The instrument sponge and needle count was correct the end case.  Patient was taken to recovery in stable condition.  Ruta Hinds, MD [facility] Office: [phone]

## 2020-09-15 NOTE — Consult Note (Signed)
Referring Physician: Dr Wyline Copas  Patient name: Madeline Burke MRN: [id number] DOB: 1933/08/06 Sex: female  REASON FOR CONSULT: left arm swelling after brachial artery puncture  HPI: Madeline Burke is a 87 y.o. female, who was admitted several days ago with abdominal pain and is under evaluation and work-up for multiple myeloma.  She had altered mental status few days ago and decreased oxygen saturation in the left brachial artery blood gas was performed.  After this she developed swelling in her left upper extremity.  The swelling has not improved and the patient has had increased pain and complains of some numbness and tingling in the left arm.  Her daughter is at the bedside today and much of the history was obtained from her since the patient is confused.  Daughter states that her baseline has some mild confusion but it is definitely worse over the last few days.  It is difficult to get much history from the patient about her left arm other than the fact that it hurts.  Other medical problems include diabetes and hypertension both of which have been stable.  Patient's hemoglobin was 5 this morning.  She had been in the 8 range prior to this.  Past Medical History:  Diagnosis Date  . Diabetes mellitus without complication ([place])   . Elevated cholesterol   . History of angioedema 2006  . Hypertension    Past Surgical History:  Procedure Laterality Date  . ABDOMINAL HYSTERECTOMY  1997  . BREAST CYST EXCISION Right 1997  . VENOUS ABLATION  04/06/2013   right leg     Family History  Problem Relation Age of Onset  . Diabetes Other   . Hyperlipidemia Other   . Hypertension Other     SOCIAL HISTORY: Social History   Socioeconomic History  . Marital status: Divorced    Spouse name: Not on file  . Number of children: Not on file  . Years of education: Not on file  . Highest education level: Not on file  Occupational History  . Not on file  Tobacco Use  . Smoking status: Never  Smoker  . Smokeless tobacco: Never Used  Substance and Sexual Activity  . Alcohol use: No  . Drug use: Not Currently  . Sexual activity: Yes    Birth control/protection: Surgical  Other Topics Concern  . Not on file  Social History Narrative  . Not on file   Social Determinants of Health   Financial Resource Strain: Not on file  Food Insecurity: Not on file  Transportation Needs: Not on file  Physical Activity: Not on file  Stress: Not on file  Social Connections: Not on file  Intimate Partner Violence: Not on file    Allergies  Allergen Reactions  . Aspirin Hives  . Bee Venom Hives  . Famotidine Other (See Comments)    "mouth irritated" Other reaction(s): Other (See Comments) "mouth irritated"  . Fish Allergy Hives  . Fish-Derived Products Other (See Comments)  . Lisinopril Cough    Other reaction(s): Cough (ALLERGY/intolerance)  . Other Hives  . Penicillamine Hives  . Penicillins Hives  . Shellfish Allergy Hives  . Sulfa Antibiotics Hives  . Sulfasalazine Hives  . Flagyl [Metronidazole] Rash    Current Facility-Administered Medications  Medication Dose Route Frequency Provider Last Rate Last Admin  . (feeding supplement) PROSource Plus liquid 30 mL  30 mL Oral BID BM Donne Hazel, MD   30 mL at 09/15/20 0837  . 0.9 %  sodium chloride infusion (Manually program via Guardrails IV Fluids)   Intravenous Once Elam Dutch, MD      . acetaminophen (TYLENOL) tablet 650 mg  650 mg Oral Q6H PRN Donne Hazel, MD   650 mg at 09/14/20 2112   Or  . acetaminophen (TYLENOL) suppository 650 mg  650 mg Rectal Q6H PRN Donne Hazel, MD      . acyclovir (ZOVIRAX) 200 MG capsule 200 mg  200 mg Oral BID Donne Hazel, MD   200 mg at 09/14/20 2112  . amLODipine (NORVASC) tablet 2.5 mg  2.5 mg Oral Daily Donne Hazel, MD   2.5 mg at 09/15/20 0630  . carvedilol (COREG) tablet 6.25 mg  6.25 mg Oral BID WC Donne Hazel, MD   6.25 mg at 09/15/20 0837  . cycloSPORINE  (RESTASIS) 0.05 % ophthalmic emulsion 1 drop  1 drop Both Eyes BID Donne Hazel, MD   1 drop at 09/15/20 [phone]  . dexamethasone (DECADRON) injection 10 mg  10 mg Intravenous Weekly Donne Hazel, MD   10 mg at 09/12/20 1224  . EPINEPHrine (EPI-PEN) injection 0.3 mg  0.3 mg Intramuscular PRN Donne Hazel, MD      . feeding supplement (GLUCERNA SHAKE) (GLUCERNA SHAKE) liquid 237 mL  237 mL Oral Q24H Donne Hazel, MD   237 mL at 09/14/20 2113  . insulin aspart (novoLOG) injection 0-9 Units  0-9 Units Subcutaneous TID WC Donne Hazel, MD   2 Units at 09/15/20 [phone]  . ipratropium-albuterol (DUONEB) 0.5-2.5 (3) MG/3ML nebulizer solution 3 mL  3 mL Nebulization Q4H PRN Donne Hazel, MD   3 mL at 09/14/20 1231  . LORazepam (ATIVAN) injection 2 mg  2 mg Intravenous Q4H PRN Donne Hazel, MD      . morphine 2 MG/ML injection 2 mg  2 mg Intravenous Q4H PRN Donne Hazel, MD   2 mg at 09/15/20 3557  . multivitamin with minerals tablet 1 tablet  1 tablet Oral Daily Donne Hazel, MD   1 tablet at 09/15/20 [phone]  . ondansetron (ZOFRAN) tablet 4 mg  4 mg Oral Q6H PRN Donne Hazel, MD       Or  . ondansetron [facility]) injection 4 mg  4 mg Intravenous Q6H PRN Donne Hazel, MD   4 mg at 09/14/20 2128  . pantoprazole (PROTONIX) EC tablet 40 mg  40 mg Oral Daily Donne Hazel, MD   40 mg at 09/15/20 0839  . polyethylene glycol (MIRALAX / GLYCOLAX) packet 17 g  17 g Oral Daily PRN Donne Hazel, MD      . rosuvastatin (CRESTOR) tablet 10 mg  10 mg Oral Daily Donne Hazel, MD   10 mg at 09/15/20 0837  . sodium bicarbonate tablet 650 mg  650 mg Oral BID Donne Hazel, MD   650 mg at 09/15/20 2542  . sodium chloride flush (NS) 0.9 % injection 3 mL  3 mL Intravenous Q12H Donne Hazel, MD   3 mL at 09/14/20 2317    ROS:   Unable to obtain due to patient's confusion.   Physical Examination  Vitals:   09/14/20 1312 09/14/20 2056 09/15/20 0631 09/15/20 0953  BP: (!) 107/53 135/60  (!) 139/57 (!) 148/74  Pulse: 60 75 76 85  Resp: 16 20 18 17   Temp: 98.7 F (37.1 C) 98.2 F (36.8 C) (!) 97.5 F (36.4 C) 98 F (36.7  C)  TempSrc: Oral Oral Oral Oral  SpO2: 100% 99% 100% 99%  Weight:      Height:        Body mass index is 22.86 kg/m.  General:  Alert and oriented to name only, no acute distress HEENT: Normal Neck: No JVD Cardiac: Regular Rate and Rhythm Skin: No rash Extremity Pulses: Nonpalpable left brachial radial and ulnar pulse Musculoskeletal: Diffuse edema left upper extremity extending from the shoulder through the elbow and down into the left hand.  Overall no focal area of swelling slightly more tender over the dorsal aspect of the forearm Neurologic: Patient is able to extend and flex her right wrist.  She is able to do some extension in the left wrist but does not fully move this difficult to know whether or not this is secondary to the pain or inability to move the hand she is able to move her digits some.  Right hand has normal neurologic function.  DATA:  Patient had a left upper extremity duplex exam today which shows the brachial radial and ulnar arteries are patent.  There was some compression extrinsically from fluid.  She also has some mild slowing of flow through the venous system secondary to compression.  ASSESSMENT: Left upper extremity hematoma secondary to brachial artery catheterization.  Does not appear to have pseudoaneurysm but has a hematoma which is causing nerve compression.   PLAN: We will recheck CBC now to make sure that her hemoglobin is reasonable to proceed to the operating room.  We will send a repeat typing cross and set up 2 units of blood.  I will check her PT and PTT in light of her myeloma to make sure she has no underlying coagulopathy.  We will also proceed to the operating room later today for decompression of the hematoma in her left arm.  Please keep n.p.o.  All of this was discussed with the patient's  daughter at the bedside.   Ruta Hinds, MD [facility] Office: [phone]

## 2020-09-15 NOTE — Anesthesia Procedure Notes (Signed)
Procedure Name: Intubation Date/Time: 09/15/2020 1:33 PM Performed by: Inda Coke, CRNA Pre-anesthesia Checklist: Patient identified, Emergency Drugs available, Suction available and Patient being monitored Patient Re-evaluated:Patient Re-evaluated prior to induction Oxygen Delivery Method: Circle System Utilized Preoxygenation: Pre-oxygenation with 100% oxygen Induction Type: IV induction Ventilation: Mask ventilation without difficulty Laryngoscope Size: Mac and 3 Grade View: Grade I Tube type: Oral Tube size: 7.0 mm Number of attempts: 1 Airway Equipment and Method: Stylet and Oral airway Placement Confirmation: ETT inserted through vocal cords under direct vision,  positive ETCO2 and breath sounds checked- equal and bilateral Secured at: 21 cm Tube secured with: Tape Dental Injury: Teeth and Oropharynx as per pre-operative assessment

## 2020-09-15 NOTE — Anesthesia Postprocedure Evaluation (Signed)
Anesthesia Post Note  Patient: Madeline Burke  Procedure(s) Performed: EXPLORATION OF LEFT BRACHIAL ARTERY (Left Arm Lower) APPLICATION OF WOUND VAC (Left Arm Lower) LIMITED DORSO-LATERAL LEFT ARM FASCIOTOMY (Left Arm Lower)     Patient location during evaluation: PACU Anesthesia Type: General Level of consciousness: awake and alert Pain management: pain level controlled Vital Signs Assessment: post-procedure vital signs reviewed and stable Respiratory status: spontaneous breathing, nonlabored ventilation, respiratory function stable and patient connected to nasal cannula oxygen Cardiovascular status: blood pressure returned to baseline and stable Postop Assessment: no apparent nausea or vomiting Anesthetic complications: no   No complications documented.  Last Vitals:  Vitals:   09/15/20 1708 09/15/20 1721  BP: 138/82 (!) 168/88  Pulse: 69 73  Resp: 15 17  Temp: 36.4 C 36.4 C  SpO2: 96% 97%    Last Pain:  Vitals:   09/15/20 1721  TempSrc: Oral  PainSc:                  Tiajuana Amass

## 2020-09-15 NOTE — Progress Notes (Signed)
Pt awake in PACU moving fingers some Pt's daughters updated  Will start with some PT tomorrow May return to OR in a few days if swelling subsides enough to close incisions  Ruta Hinds, MD [facility] [place] Office: [phone]

## 2020-09-16 ENCOUNTER — Inpatient Hospital Stay (HOSPITAL_COMMUNITY): Payer: Medicare Other

## 2020-09-16 ENCOUNTER — Encounter (HOSPITAL_COMMUNITY): Payer: Self-pay | Admitting: Vascular Surgery

## 2020-09-16 DIAGNOSIS — C9000 Multiple myeloma not having achieved remission: Principal | ICD-10-CM

## 2020-09-16 DIAGNOSIS — M7989 Other specified soft tissue disorders: Secondary | ICD-10-CM

## 2020-09-16 DIAGNOSIS — M79601 Pain in right arm: Secondary | ICD-10-CM | POA: Diagnosis not present

## 2020-09-16 DIAGNOSIS — N179 Acute kidney failure, unspecified: Secondary | ICD-10-CM | POA: Diagnosis not present

## 2020-09-16 LAB — GLUCOSE, CAPILLARY
Glucose-Capillary: 192 mg/dL — ABNORMAL HIGH (ref 70–99)
Glucose-Capillary: 165 mg/dL — ABNORMAL HIGH (ref 70–99)
Glucose-Capillary: 139 mg/dL — ABNORMAL HIGH (ref 70–99)
Glucose-Capillary: 132 mg/dL — ABNORMAL HIGH (ref 70–99)

## 2020-09-16 LAB — CBC
HCT: 26.9 % — ABNORMAL LOW (ref 36.0–46.0)
Hemoglobin: 9.4 g/dL — ABNORMAL LOW (ref 12.0–15.0)
MCH: 29.7 pg (ref 26.0–34.0)
MCHC: 34.9 g/dL (ref 30.0–36.0)
MCV: 84.9 fL (ref 80.0–100.0)
Platelets: 66 10*3/uL — ABNORMAL LOW (ref 150–400)
RBC: 3.17 MIL/uL — ABNORMAL LOW (ref 3.87–5.11)
RDW: 15.8 % — ABNORMAL HIGH (ref 11.5–15.5)
WBC: 13.2 10*3/uL — ABNORMAL HIGH (ref 4.0–10.5)
nRBC: 1.1 % — ABNORMAL HIGH (ref 0.0–0.2)

## 2020-09-16 LAB — BASIC METABOLIC PANEL
Anion gap: 9 (ref 5–15)
BUN: 34 mg/dL — ABNORMAL HIGH (ref 8–23)
CO2: 18 mmol/L — ABNORMAL LOW (ref 22–32)
Calcium: 7.1 mg/dL — ABNORMAL LOW (ref 8.9–10.3)
Chloride: 112 mmol/L — ABNORMAL HIGH (ref 98–111)
Creatinine, Ser: 2.33 mg/dL — ABNORMAL HIGH (ref 0.44–1.00)
GFR, Estimated: 20 mL/min — ABNORMAL LOW (ref >60)
Glucose, Bld: 200 mg/dL — ABNORMAL HIGH (ref 70–99)
Potassium: 5.4 mmol/L — ABNORMAL HIGH (ref 3.5–5.1)
Sodium: 139 mmol/L (ref 135–145)

## 2020-09-16 LAB — HEMOGLOBIN A1C
Hgb A1c MFr Bld: 6 % — ABNORMAL HIGH (ref 4.8–5.6)
Mean Plasma Glucose: 125.5 mg/dL

## 2020-09-16 LAB — FIBRINOGEN: Fibrinogen: 415 mg/dL (ref 210–475)

## 2020-09-16 LAB — PROTIME-INR
INR: 1.4 — ABNORMAL HIGH (ref 0.8–1.2)
Prothrombin Time: 16.1 seconds — ABNORMAL HIGH (ref 11.4–15.2)

## 2020-09-16 LAB — APTT: aPTT: 41 seconds — ABNORMAL HIGH (ref 24–36)

## 2020-09-16 MED ORDER — SODIUM CHLORIDE 0.9% IV SOLUTION: Freq: Once | INTRAVENOUS | Status: AC

## 2020-09-16 MED ORDER — SODIUM ZIRCONIUM CYCLOSILICATE 10 G PO PACK
10.0000 g | PACK | Freq: Two times a day (BID) | ORAL | Status: DC
  Administered 2020-09-16 – 2020-09-17 (×3): 10 g via ORAL
  Filled 2020-09-16 (×3): qty 1

## 2020-09-16 NOTE — Op Note (Signed)
NAME: Madeline Burke, CULHANE MEDICAL RECORD NO: [id number] ACCOUNT NO: [id number] DATE OF BIRTH: 1933/09/09 FACILITY: [facility] LOCATION: [id number] PHYSICIAN: Erine Phenix C. Lenon Curt, MD  Operative Report   DATE OF PROCEDURE: 09/15/2020  PREOPERATIVE DIAGNOSIS:  Compartment syndrome of the left upper extremity.  POSTOPERATIVE DIAGNOSIS:  Compartment syndrome of the left upper extremity.  PROCEDURE:  Fasciotomy, left forearm.  INDICATIONS:  The patient is an 87 year old lady who I was consulted intraoperatively by Dr. Oneida Alar with Vascular Surgery for compartment syndrome of the left forearm.  The patient apparently had a brachial artery injury, which resulted in a significant  hematoma formation and compartment syndrome of her left upper extremity.  Dr. Oneida Alar had repaired the arterial injury, evacuated significant amount of hematoma, but was concerned about her forearm and compartment syndrome.  On examination, she indeed had  clinical evidence of compartment syndrome of the left forearm; therefore, fasciotomy was indicated.  DESCRIPTION OF PROCEDURE:  Upon my arrival, the patient was intubated asleep.  Dr. Oneida Alar had explored her brachial artery and that wound was opened from an exploration more proximally.  On examination of her forearm, she was very tense along the dorsal  lateral aspect of her forearm over the mobile wad.  Her hand and volar and more dorsal compartments were swollen, however, felt soft and without compartment syndrome.  Incision was made over the mobile wad through the skin and subcutaneous tissue.  The  fascia was encountered.  Immediately upon entering, the muscle started bulging.  The muscle did appear viable, however.  The fasciotomy was carried both proximally and distally releasing the entire compartment.  Hemostasis was then obtained.  Staples  were placed on each side of the wound and a Jacob's ladder type closure was done with a blue vessel loop for future tightening and potential  closure.  Please see Dr. Oneida Alar' operative note for the remainder of the procedure.  Dressing consisted of  wet-to-dry Kerlix and a posterior resting splint from the wrist all the way up to the upper arm.  Estimated blood loss for my procedure was minimal.  There were no acute complications.  Function of her wrist and fingers was unable to be documented by me,  but should be available in the chart from other providers.  This will be followed closely.   ROH D: 09/15/2020 5:04:22 pm T: 09/15/2020 9:29:00 pm  JOB: [id number]/ [id number]

## 2020-09-16 NOTE — Progress Notes (Signed)
Reinforced left arm dressing with ABDs and kerlix. Bleeding coming from posterior portion of the arm. Disposable pad placed underneath arm to catch drainage. Will continue to monitor

## 2020-09-16 NOTE — Progress Notes (Signed)
Physical Therapy Treatment Patient Details Name: Madeline Burke MRN: [id number] DOB: 12/26/1933 Today's Date: 09/16/2020    History of Present Illness Madeline BARAY is 87 year old f who presents with abdominal pain. She was being work up for elevated IG levels as outpatient. She was found to be in AKI , metabolic acidosis with worsening of baseline creatinine from 1 to 2.5, thought to be secondary to Multiple myeloma. She developed left arm swelling, with a drop in hemoglobin to 5.4 from a baseline of 9 s/p 2 units PRBCs. She was found to have LUE hematoma, due to L brachial artery injury with compartment syndrome probably sec to brachial artery catheterization. Vascular surgery consulted and she underwent exploration of the left brachial artery and placement of VAC dressing on 3/28. She was transferred to [facility] as per vascular surgery recommendations as she continued to having oozing from fasciotomies of the left arm. PMH: hypertension, hyperlipidemia, DM, angioedema, Multiple Myeloma, dementia.    PT Comments    Pt received in supine, confused but agreeable to therapy session with encouragement, with fair participation and tolerance for supine exercises. Deferred EOB/OOB mobility for safety due to pt's reported severe pain in LUE and poor cognition/anxiety with bed-level activities. Pt performed supine BLE HEP with A/AAROM and needs multimodal cues for sequencing/technique, with B quad lag during straight leg raise exercise. Pt continues to benefit from PT services to progress toward functional mobility goals. Discharge recommendations below, pending progress and LUE pain tolerance; unable to safely assess OOB mobility this date.  Follow Up Recommendations  Home health PT;Supervision for mobility/OOB (pending progress)     Equipment Recommendations  None recommended by PT    Recommendations for Other Services       Precautions / Restrictions Precautions Precautions: Fall Precaution Comments:  very painful LUE s/p fasciotomy Restrictions Weight Bearing Restrictions: No    Mobility  Bed Mobility Overal bed mobility: Needs Assistance Bed Mobility: Rolling Rolling: Min assist;+2 for safety/equipment         General bed mobility comments: deferred EOB transition due to pt cognition/severe pain in LUE after fasciotomy, pt repositioned in bed with assist from NT for comfort and needed +72moA for posterior supine scoot toward HOB with bed pads (pt pushing with BLE)    Transfers                    Ambulation/Gait                 Stairs             Wheelchair Mobility    Modified Rankin (Stroke Patients Only)       Balance Overall balance assessment:  (UTA, bed-level session)                                          Cognition Arousal/Alertness: Awake/alert Behavior During Therapy: Anxious Overall Cognitive Status: Impaired/Different from baseline Area of Impairment: Orientation;Attention;Memory;Following commands;Safety/judgement;Awareness;Problem solving                 Orientation Level: Disoriented to;Situation;Time;Place Current Attention Level: Sustained Memory: Decreased recall of precautions;Decreased short-term memory Following Commands: Follows one step commands inconsistently;Follows one step commands with increased time Safety/Judgement: Decreased awareness of safety;Decreased awareness of deficits   Problem Solving: Difficulty sequencing;Requires verbal cues;Requires tactile cues General Comments: pt internally distracted due to LUE pain and more confused  this date than in previous session, pt intermittently tearful and per RN pt has been more agitated this date. Has difficulty following cues for rolling/pad repositioning and needs multimodal cues for LE exercise participation      Exercises General Exercises - Lower Extremity Ankle Circles/Pumps: AROM;Both;10 reps;Supine Heel Slides: AROM;Both;15  reps;Supine Hip ABduction/ADduction: AAROM;Both;10 reps;Supine;AROM Straight Leg Raises: AAROM;Both;10 reps;Supine    General Comments General comments (skin integrity, edema, etc.): increased drainage on pillow under LUE but appearing to have dried somewhat from earlier in AM.; BP 129/55 (78) taken during session, HR/SpO2 WNL      Pertinent Vitals/Pain Pain Assessment: Faces Faces Pain Scale: Hurts whole lot Pain Location: LUE>epigastric discomfort Pain Descriptors / Indicators: Grimacing;Sharp;Burning;Pressure Pain Intervention(s): Limited activity within patient's tolerance;Monitored during session;Premedicated before session;Repositioned    Home Living                      Prior Function            PT Goals (current goals can now be found in the care plan section) Acute Rehab PT Goals Patient Stated Goal: to go home PT Goal Formulation: With patient/family Time For Goal Achievement: 09/26/20 Potential to Achieve Goals: Good Progress towards PT goals: Not progressing toward goals - comment (confusion/LUE pain limiting this date)    Frequency    Min 3X/week      PT Plan Current plan remains appropriate;Other (comment) (pending progress next session)    Co-evaluation              AM-PAC PT "6 Clicks" Mobility   Outcome Measure  Help needed turning from your back to your side while in a flat bed without using bedrails?: A Little Help needed moving from lying on your back to sitting on the side of a flat bed without using bedrails?: A Little Help needed moving to and from a bed to a chair (including a wheelchair)?: A Lot Help needed standing up from a chair using your arms (e.g., wheelchair or bedside chair)?: A Lot Help needed to walk in hospital room?: A Lot Help needed climbing 3-5 steps with a railing? : Total 6 Click Score: 13    End of Session Equipment Utilized During Treatment:  (transfer pads on bed) Activity Tolerance: Patient limited by  pain;Other (comment) (AMS) Patient left: in bed;with call bell/phone within reach;with bed alarm set;with family/visitor present Nurse Communication: Mobility status;Patient requests pain meds PT Visit Diagnosis: Difficulty in walking, not elsewhere classified (R26.2)     Time: 6237-6283 PT Time Calculation (min) (ACUTE ONLY): 17 min  Charges:  $Therapeutic Exercise: 8-22 mins                     Everest Hacking P., PTA Acute Rehabilitation Services Pager: [phone] Office: [place] 09/16/2020, 5:13 PM

## 2020-09-16 NOTE — Progress Notes (Addendum)
CBC> good response to transfusion with current H and H = 9.4/26.9 s/p 3u PRBCs and 2u of FFP WBC down to 13.2 PT/aPTT/INR: only sightly prolonged sys BP 90s-110s HR 70-90s   Notified by RN of bloody strike-through on dressing at approximately 12n. Patient in NAD. Daughter at bedside. VSS.  FFP infusing. I did not disturb dressing.

## 2020-09-16 NOTE — Consult Note (Signed)
Telespecialist EEG Report   EEG Date 09/16/20   Duration: 23.34   Clinical Indication:  AMS   EEG Procedure:  This is a digitally recorded routine electroencephalogram. The international 10-20 electrode placement system is used for scalp electrode placement. Eighteen channels of scalp EEG are recorded The data are stored digitally and reviewed in reformatted montages for optimal display.   EEG Description: This EEG is well organized.  There was a well formed, well sustained and symmetrical 6-7 hz posterior dominant rhythm that attenuated with eye opening and drowsiness.  The background consisted of a mixture of theta, some delta intermixed with faster frequencies of medium voltage.  During drowsiness, background slowing increased.  Stage 2 sleep was not recorded. There were no focal abnormalities, persistent asymmetries or epileptiform discharges.  Activating procedures:  These were not performed.  EKG:  The EKG rhythm strip showed a NSR at 90bpm   EEG Classification:  Abnormal 1.  Background slowing, generalized   EEG Interpretation: This routine EEG recorded in the awake and drowsy states is midly abnormal.  The background slowing is suggestive of diffuse cerebral dysfunction.  There were no focal abnormalities, persistent asymmetries or epileptiform discharges.

## 2020-09-16 NOTE — Plan of Care (Signed)
  Problem: Clinical Measurements: Goal: Will remain free from infection Outcome: Progressing   Problem: Health Behavior/Discharge Planning: Goal: Ability to manage health-related needs will improve Outcome: Not Progressing   

## 2020-09-16 NOTE — Progress Notes (Signed)
PROGRESS NOTE    Madeline Burke  HWK:[id number] DOB: 11-20-1933 DOA: 09/10/2020 PCP: Leeroy Cha, MD    Chief Complaint  Patient presents with  . Abdominal Pain    Brief Narrative:  87 year old with prior h/o hypertension, hyperlipidemia, DM, angioedema, Multiple Myeloma presents with abdominal pain. She was being work up for elevated IG levels as outpatient. This admission she was found to be in AKI , metabolic acidosis with worsening of baseline creatinine from 1 to 2.5. it was thought to be secondary to Multiple myeloma. Meanwhile she also developed left arm swelling, with a drop in hemoglobin to 5.4 from a baseline of 9. She was found to have left upper extremity hematoma, due to left brachial artery injury with compartment syndrome probably  sec to brachial artery catheterization. Vascular surgery consulted and she underwent exploration of the left brachial artery and placement of VAC dressing on 3/28.  She was transferred to [facility] as per vascular surgery recommendations as she continued to having oozing from fasciotomies of the left arm.    Assessment & Plan:   Principal Problem:   AKI (acute kidney injury) ([place]) Active Problems:   Abdominal pain, epigastric   HTN (hypertension)   Diabetes ([place])   Anemia   ARF (acute renal failure) (H[facility])     Left forearm compartment syndrome  Left upper extremity hematoma, due to left brachial artery injury with compartment syndrome probably  sec to brachial artery catheterization. Vascular surgery consulted and she underwent exploration of the left brachial artery and placement of VAC dressing on 3/28.  She was transferred to [facility] as per vascular surgery recommendations as she continued to having oozing from fasciotomies of the left arm.  Pain control with percocet.   AKI with hyperkalemia and metabolic acidosis:   Probably secondary to MM.  Baseline creatinine around 1, admitted with a creatinine of 2.5, currently around 2.33 with  hyperkalemia an metabolic acidosis.    Acute anemia of blood loss from left upper extremity hematoma and surgical exploration of the left brachial artery S/p 3 units of prbc transfusion and 2 units of FFP.  Hemoglobin has improved to 9.4 and hematocrit is 26.9.    Leukocytosis Probably reactive.  Lactic acid is negative.  Continue to monitor.     Thrombocytopenia:  Platelets on admission 148,000 dropped to 66,000 PT/PTT/INR IS 16/41/1.4.     Syncope:  Initially suspected possible vasovagal or neuro cardiogenic in the setting of severe pain.  Pt had an episode of tongue biting, suspicious for seizures,. EEG ordered and pending.  Initial CT head and MRI brain negative for acute infarct.  Continue with ativan PRN for seizures.     Multiple Myeloma light chain disease:  S/p first dose of Velcade on Friday 09/12/20.  Will reach out to Dr Edward Qualia for further recommendations.    Hypertension:  Well controlled. Hold amlodipine. Continue with coreg.     Type 2 Diabetes Mellitus:  CBG (last 3)  Recent Labs    09/15/20 1611 09/15/20 2110 09/16/20 0609  GLUCAP 193* 199* 192*   Resume SSI.  Hemoglobin A1c is pending.     Hyperlipidemia:  Continue with crestor.    DVT prophylaxis: Scd's Code Status: fULL CODE.  Family Communication: Family at bedside.  Disposition:   Status is: Inpatient  Remains inpatient appropriate because:Ongoing diagnostic testing needed not appropriate for outpatient work up and IV treatments appropriate due to intensity of illness or inability to take PO   Dispo: The patient  is from: Home              Anticipated d/c is to: pending              Patient currently is not medically stable to d/c.   Difficult to place patient No       Consultants:   Vascular surgery  Surgery.   Procedures:  Brachial artery exploration/ repair , placement of VAC dressing.   Antimicrobials:  Antibiotics Given (last 72 hours)    Date/Time Action  Medication Dose   09/13/20 2252 Given   acyclovir (ZOVIRAX) 200 MG capsule 200 mg 200 mg   09/14/20 0831 Given   acyclovir (ZOVIRAX) 200 MG capsule 200 mg 200 mg   09/14/20 2112 Given   acyclovir (ZOVIRAX) 200 MG capsule 200 mg 200 mg   09/15/20 2204 Given   acyclovir (ZOVIRAX) 200 MG capsule 200 mg 200 mg   09/16/20 3244 Given   acyclovir (ZOVIRAX) 200 MG capsule 200 mg 200 mg      Subjective: Pt appears comfortable, no tin distress.   Objective: Vitals:   09/16/20 0607 09/16/20 0715 09/16/20 0815 09/16/20 0848  BP: 119/68 107/60 98/72 (!) 104/57  Pulse: 93 99 88 84  Resp: _0 Temp: 97.6 F (36.4 C) (!) 97.5 F (36.4 C) 98.1 F (36.7 C) 97.6 F (36.4 C)  TempSrc: Oral Oral Oral Oral  SpO2: 97% 97% 95% 100%  Weight:      Height:        Intake/Output Summary (Last 24 hours) at 09/16/2020 0859 Last data filed at 09/16/2020 0130 Gross per 24 hour  Intake 2089.2 ml  Output 350 ml  Net 1739.2 ml   Filed Weights   09/10/20 1829  Weight: 56.7 kg    Examination:  General exam: Appears calm and comfortable  Respiratory system: Clear to auscultation. Respiratory effort normal. Cardiovascular system: S1 & S2 heard, RRR. No JVD,  No pedal edema. Gastrointestinal system: Abdomen is nondistended, soft and nontender. Normal bowel sounds heard. Central nervous system: Alert and oriented. No focal neurological deficits. Extremities: left  upper extremity swelling, bandaged.  Skin: No rashes Psychiatry:  Mood & affect appropriate.     Data Reviewed: I have personally reviewed following labs and imaging studies  CBC: Recent Labs  Lab 09/10/20 1910 09/11/20 0527 09/13/20 2325 09/14/20 0516 09/15/20 0443 09/15/20 1027 09/15/20 1229 09/16/20 0530  WBC 7.1   < > 8.4 8.3 18.8* 21.7*  --  13.2*  NEUTROABS 1.8  --  5.0  --   --   --   --   --   HGB 8.6*   < > 7.0* 7.2* 5.4* 5.6* 5.1* 9.4*  HCT 26.7*   < > 22.1* 23.0* 16.5* 17.8* 15.0* 26.9*  MCV 89.9   < >  88.8 92.4 87.8 90.4  --  84.9  PLT 139*   < > 110* 100* 101* 101*  --  66*   < > = values in this interval not displayed.    Basic Metabolic Panel: Recent Labs  Lab 09/13/20 2325 09/14/20 0516 09/15/20 0443 09/15/20 1027 09/15/20 1229 09/16/20 0530  NA 138 143 140 142 143 139  K 4.3 4.3 4.9 5.4* 5.1 5.4*  CL 113* 116* 114* 113* 113* 112*  CO2 17* 16* 20* 20*  --  18*  GLUCOSE 252* 145* 218* 224* 198* 200*  BUN 27* 27* 32* 31* 33* 34*  CREATININE 1.86* 1.83* 1.88* 2.13* 2.30* 2.33*  CALCIUM 7.8* 8.2* 7.6* 7.7*  --  7.1*  MG  --   --  1.8  --   --   --     GFR: Estimated Creatinine Clearance: 14 mL/min (A) (by C-G formula based on SCr of 2.33 mg/dL (H)).  Liver Function Tests: Recent Labs  Lab 09/12/20 0521 09/13/20 0619 09/13/20 2325 09/14/20 0516 09/15/20 0443  AST 19 24 91* 91* 59*  ALT 12 13 38 33 32  ALKPHOS 47 46 52 55 56  BILITOT 0.7 0.4 0.6 0.6 0.4  PROT 6.2* 6.2* 6.4* 6.4* 5.6*  ALBUMIN 3.6 3.6 3.8 3.7 3.1*    CBG: Recent Labs  Lab 09/15/20 0738 09/15/20 1147 09/15/20 1611 09/15/20 2110 09/16/20 0609  GLUCAP 193* 186* 193* 199* 192*     Recent Results (from the past 240 hour(s))  SARS CORONAVIRUS 2 (TAT 6-24 HRS) Nasopharyngeal Nasopharyngeal Swab     Status: None   Collection Time: 09/10/20 10:42 PM   Specimen: Nasopharyngeal Swab  Result Value Ref Range Status   SARS Coronavirus 2 NEGATIVE NEGATIVE Final    Comment: (NOTE) SARS-CoV-2 target nucleic acids are NOT DETECTED.  The SARS-CoV-2 RNA is generally detectable in upper and lower respiratory specimens during the acute phase of infection. Negative results do not preclude SARS-CoV-2 infection, do not rule out co-infections with other pathogens, and should not be used as the sole basis for treatment or other patient management decisions. Negative results must be combined with clinical observations, patient history, and epidemiological information. The expected result is  Negative.  Fact Sheet for Patients: SugarRoll.be  Fact Sheet for Healthcare Providers: https://www.woods-mathews.com/  This test is not yet approved or cleared by the [place] FDA and  has been authorized for detection and/or diagnosis of SARS-CoV-2 by FDA under an Emergency Use Authorization (EUA). This EUA will remain  in effect (meaning this test can be used) for the duration of the COVID-19 declaration under Se ction 564(b)(1) of the Act, 21 U.S.C. section 360bbb-3(b)(1), unless the authorization is terminated or revoked sooner.  Performed at [facility], [address], [place], [place] [address]          Radiology Studies: MR BRAIN WO CONTRAST  Result Date: 09/14/2020 CLINICAL DATA:  Seizure EXAM: MRI HEAD WITHOUT CONTRAST TECHNIQUE: Multiplanar, multiecho pulse sequences of the brain and surrounding structures were obtained without intravenous contrast. COMPARISON:  None. FINDINGS: Motion artifact is present. Brain: There is no acute infarction or intracranial hemorrhage. There is no intracranial mass, mass effect, or edema. There is no hydrocephalus or extra-axial fluid collection. Prominence of the ventricles and sulci reflects generalized parenchymal volume loss. Foci of T2 hyperintensity in the supratentorial and pontine white matter are nonspecific but probably reflect moderate chronic microvascular ischemic changes. Vascular: Major vessel flow voids at the skull base are preserved. Skull and upper cervical spine: Normal marrow signal is preserved. Sinuses/Orbits: Paranasal sinuses are aerated. Bilateral lens replacements. Other: Sella is partially empty.  Mastoid air cells are clear. IMPRESSION: No evidence of recent infarction, hemorrhage, or mass. Moderate chronic microvascular ischemic changes. Electronically Signed   By: Macy Mis M.D.   On: 09/14/2020 18:25   VAS [place] UPPER EXTREMITY ARTERIAL DUPLEX  Result Date:  09/15/2020 UPPER EXTREMITY DUPLEX STUDY Indications: Patient complains of Swelling - R/O compartment syndrome.  Comparison Study: No previous exams Performing Technologist: Rogelia Rohrer  Examination Guidelines: A complete evaluation includes B-mode imaging, spectral Doppler, color Doppler, and power Doppler as needed of all accessible  portions of each vessel. Bilateral testing is considered an integral part of a complete examination. Limited examinations for reoccurring indications may be performed as noted.  Left Doppler Findings: +--------------+----------+----------+--------+-------------------------+ Site          PSV (cm/s)Waveform  StenosisComments                  +--------------+----------+----------+--------+-------------------------+ Subclavian Mid92        monophasic        Diastolic retrograde flow +--------------+----------+----------+--------+-------------------------+ Axillary      87        monophasic        Diastolic retrograde flow +--------------+----------+----------+--------+-------------------------+ Brachial Mid  137       monophasic        Diastolic retrograde flow +--------------+----------+----------+--------+-------------------------+ Radial Prox   102       monophasic                                  +--------------+----------+----------+--------+-------------------------+ Radial Mid    58        monophasic                                  +--------------+----------+----------+--------+-------------------------+ Radial Dist   48        monophasic                                  +--------------+----------+----------+--------+-------------------------+ Ulnar Prox    72        monophasic                                  +--------------+----------+----------+--------+-------------------------+ Ulnar Mid     51        monophasic                                  +--------------+----------+----------+--------+-------------------------+ Ulnar  Dist    53        monophasic                                  +--------------+----------+----------+--------+-------------------------+   Summary:  Left: Arterial flow is seen down to the wrist however, diastolic       retrograde flow seen in subclavian, axillary, and brachial       arteries in combination with venous compression coincide with       early signs of compartment syndrome. *See table(s) above for measurements and observations.    Preliminary    ECHOCARDIOGRAM COMPLETE  Result Date: 09/14/2020    ECHOCARDIOGRAM REPORT   Patient Name:   Madeline Burke Date of Exam: 09/14/2020 Medical Rec #:  [id number]      Height:       62.0 in Accession #:    [id number]     Weight:       125.0 lb Date of Birth:  07-16-1933      BSA:          1.566 m Patient Age:    62 years       BP:           107/53 mmHg Patient Gender: F  HR:           60 bpm. Exam Location:  Inpatient Procedure: Cardiac Doppler, Color Doppler and Strain Analysis Indications:    Syncope R55  History:        Patient has no prior history of Echocardiogram examinations.                 Risk Factors:Hypertension, Diabetes and Dyslipidemia. Multiple                 myeloma with renal failure.  Sonographer:    [name] RDCS Referring Phys: [place]  1. Left ventricular ejection fraction, by estimation, is 60 to 65%. Left ventricular ejection fraction by 3D volume is 60 %. The left ventricle has normal function. The left ventricle has no regional wall motion abnormalities. Left ventricular diastolic  parameters were normal. The average left ventricular global longitudinal strain is -20.6 %. The global longitudinal strain is normal.  2. Right ventricular systolic function is normal. The right ventricular size is normal. There is normal pulmonary artery systolic pressure. The estimated right ventricular systolic pressure is 35.5 mmHg.  3. The mitral valve is grossly normal. Trivial mitral valve regurgitation. No  evidence of mitral stenosis.  4. The aortic valve is tricuspid. Aortic valve regurgitation is not visualized. No aortic stenosis is present.  5. The inferior vena cava is dilated in size with >50% respiratory variability, suggesting right atrial pressure of 8 mmHg. Conclusion(s)/Recommendation(s): Normal biventricular function without evidence of hemodynamically significant valvular heart disease. FINDINGS  Left Ventricle: Left ventricular ejection fraction, by estimation, is 60 to 65%. Left ventricular ejection fraction by 3D volume is 60 %. The left ventricle has normal function. The left ventricle has no regional wall motion abnormalities. The average left ventricular global longitudinal strain is -20.6 %. The global longitudinal strain is normal. The left ventricular internal cavity size was normal in size. There is no left ventricular hypertrophy. Left ventricular diastolic parameters were normal. Right Ventricle: The right ventricular size is normal. No increase in right ventricular wall thickness. Right ventricular systolic function is normal. There is normal pulmonary artery systolic pressure. The tricuspid regurgitant velocity is 2.62 m/s, and  with an assumed right atrial pressure of 8 mmHg, the estimated right ventricular systolic pressure is 73.2 mmHg. Left Atrium: Left atrial size was normal in size. Right Atrium: Right atrial size was normal in size. Pericardium: Trivial pericardial effusion is present. Mitral Valve: The mitral valve is grossly normal. Trivial mitral valve regurgitation. No evidence of mitral valve stenosis. Tricuspid Valve: The tricuspid valve is grossly normal. Tricuspid valve regurgitation is mild . No evidence of tricuspid stenosis. Aortic Valve: The aortic valve is tricuspid. Aortic valve regurgitation is not visualized. No aortic stenosis is present. Pulmonic Valve: The pulmonic valve was grossly normal. Pulmonic valve regurgitation is trivial. No evidence of pulmonic stenosis.  Aorta: The aortic root and ascending aorta are structurally normal, with no evidence of dilitation. Venous: The right upper pulmonary vein is normal. The inferior vena cava is dilated in size with greater than 50% respiratory variability, suggesting right atrial pressure of 8 mmHg. IAS/Shunts: The atrial septum is grossly normal.  LEFT VENTRICLE PLAX 2D LVIDd:         4.20 cm         Diastology LVIDs:         2.60 cm         LV e' medial:    7.83 cm/s LV PW:  1.10 cm         LV E/e' medial:  10.2 LV IVS:        1.20 cm         LV e' lateral:   9.57 cm/s LVOT diam:     1.90 cm         LV E/e' lateral: 8.4 LV SV:         72 LV SV Index:   46              2D LVOT Area:     2.84 cm        Longitudinal                                Strain                                2D Strain GLS  -20.6 %                                Avg:                                 3D Volume EF                                LV 3D EF:    Left                                             ventricular                                             ejection                                             fraction by                                             3D volume                                             is 60 %.                                 3D Volume EF:                                3D EF:        60 %  LV EDV:       72 ml                                LV ESV:       29 ml                                LV SV:        43 ml RIGHT VENTRICLE RV S prime:     14.10 cm/s TAPSE (M-mode): 2.1 cm LEFT ATRIUM             Index       RIGHT ATRIUM           Index LA diam:        3.40 cm 2.17 cm/m  RA Area:     17.70 cm LA Vol (A2C):   33.6 ml 21.46 ml/m RA Volume:   47.00 ml  30.02 ml/m LA Vol (A4C):   57.2 ml 36.54 ml/m LA Biplane Vol: 48.3 ml 30.85 ml/m  AORTIC VALVE LVOT Vmax:   112.00 cm/s LVOT Vmean:  78.600 cm/s LVOT VTI:    0.255 m  AORTA Ao Root diam: 2.80 cm MITRAL VALVE               TRICUSPID VALVE  MV Area (PHT): 4.15 cm    TR Peak grad:   27.5 mmHg MV Decel Time: 183 msec    TR Vmax:        262.00 cm/s MV E velocity: 80.10 cm/s MV A velocity: 82.70 cm/s  SHUNTS MV E/A ratio:  0.97        Systemic VTI:  0.26 m                            Systemic Diam: 1.90 cm Eleonore Chiquito MD Electronically signed by Eleonore Chiquito MD Signature Date/Time: 09/14/2020/3:10:55 PM    Final         Scheduled Meds: . (feeding supplement) PROSource Plus  30 mL Oral BID BM  . sodium chloride   Intravenous Once  . acyclovir  200 mg Oral BID  . amLODipine  2.5 mg Oral Daily  . carvedilol  6.25 mg Oral BID WC  . cycloSPORINE  1 drop Both Eyes BID  . dexamethasone (DECADRON) injection  10 mg Intravenous Weekly  . feeding supplement (GLUCERNA SHAKE)  237 mL Oral Q24H  . insulin aspart  0-9 Units Subcutaneous TID WC  . multivitamin with minerals  1 tablet Oral Daily  . pantoprazole  40 mg Oral Daily  . rosuvastatin  10 mg Oral Daily  . sodium bicarbonate  650 mg Oral BID  . sodium chloride flush  3 mL Intravenous Q12H   Continuous Infusions:   LOS: 5 days        Hosie Poisson, MD [facility]   To contact the attending provider between 7A-7P or the covering provider during after hours 7P-7A, please log into the web site [email or web address] and access using universal [place] password for that web site. If you do not have the password, please call the hospital operator.  09/16/2020, 8:59 AM

## 2020-09-16 NOTE — Progress Notes (Signed)
MD, please consider placing an order for a PICC. Patient is a very hard stick and lab had very difficult time obtaining morning labs.   Thank you,   Nursing

## 2020-09-16 NOTE — Progress Notes (Addendum)
Called to see patient for oozing from left upper extremity incision.  Patient has had progressive staining of her dressing over the course of the evening.  She has not been hemodynamically unstable.  She is actually been hypertensive.  Physical exam:  Vitals:   09/15/20 1721 09/15/20 2020 09/15/20 2102 09/15/20 2336  BP: (!) 168/88 (!) 172/95 (!) 145/83 (!) 147/86  Pulse: 73 78  82  Resp: 17 19 19 18  Temp: 97.6 F (36.4 C) 98.6 F (37 C)  98.5 F (36.9 C)  TempSrc: Oral Oral  Oral  SpO2: 97% 98% 95% 98%  Weight:      Height:        VAC discontinued and entire dressing removed.  Muscle bellies on the dorsal forearm are dusky in appearance.  There is some oozing from the skin edges in the muscle bellies.  There is no significant venous or arterial bleeding.  There is some edema and ecchymosis of the medial upper arm incision.  There is no significant hematoma.  There is ongoing skin edge and soft tissue oozing.  Right arm 3 x 3 cm mass right antecubital area not painful   A new dry dressing was applied to the entire left upper extremity 4 x 4's Kerlix and Ace.  VAC was discontinued for now.  Assessment: Ongoing oozing from fasciotomies left arm should improve with time and dressings.  Small hematoma right arm no compromise of skin and surrounding tissue non pulsatile observe for now  Plan: Check CBC PT and PTT as I suspect she may have underlying coagulopathy either secondary to her multiple myeloma or low-grade DIC or potentially platelet dysfunction from her renal dysfunction.  We will leave dressing in place for today.  Consideration for return to the OR later in the week if swelling resolves some for consideration of closing her fasciotomies.  Plan discussed with patient's daughter at the bedside and reassured.   , MD [facility] Office: [phone]  Addendum:  CBC    Component Value Date/Time   WBC 13.2 (H) 09/16/2020 0530    RBC 3.17 (L) 09/16/2020 0530   HGB 9.4 (L) 09/16/2020 0530   HGB 9.1 (L) 08/25/2020 1518   HCT 26.9 (L) 09/16/2020 0530   PLT 66 (L) 09/16/2020 0530   PLT 134 (L) 08/25/2020 1518   MCV 84.9 09/16/2020 0530   MCH 29.7 09/16/2020 0530   MCHC 34.9 09/16/2020 0530   RDW 15.8 (H) 09/16/2020 0530   LYMPHSABS 1.5 09/13/2020 2325   MONOABS 1.7 (H) 09/13/2020 2325   EOSABS 0.0 09/13/2020 2325   BASOSABS 0.0 09/13/2020 2325    BMET    Component Value Date/Time   NA 139 09/16/2020 0530   K 5.4 (H) 09/16/2020 0530   CL 112 (H) 09/16/2020 0530   CO2 18 (L) 09/16/2020 0530   GLUCOSE 200 (H) 09/16/2020 0530   BUN 34 (H) 09/16/2020 0530   CREATININE 2.33 (H) 09/16/2020 0530   CREATININE 1.97 (H) 08/25/2020 1518   CALCIUM 7.1 (L) 09/16/2020 0530   GFRNONAA 20 (L) 09/16/2020 0530   GFRNONAA 25 (L) 08/25/2020 1518   GFRAA >60 05/13/2019 0850   PTT 41 INR 1.4  Thrombocytopenia with coagulapathy and ongoing oozing. I suspect related to her myeloma or low grade DIC Renal dysfunction fairly stable.  Has hyperkalemia and hyperglycemia will defer to primary team.  Will transfuse 2 Units FFP to improve coagulapathy   , MD [facility] [place] Office:   [phone]     

## 2020-09-16 NOTE — Progress Notes (Signed)
EEG complete - results pending 

## 2020-09-16 NOTE — Progress Notes (Signed)
Right upper extremity venous duplex complete.  Please see CV proc tab for preliminary results. [place], RVT 2:27 PM  09/16/2020

## 2020-09-16 NOTE — Progress Notes (Signed)
Notified Dr. Myna Hidalgo about coming to see patient to assess right arm. Golf ball size area, hard to touch in right AC. Per daughter, it was not there prior to coming to hospital. Will have MD assess and possibly do an [place] to r/o DVT.   Will continue to monitor

## 2020-09-16 NOTE — Progress Notes (Signed)
New bloody drainage noted on Left arm compression dressing. Katharine Look PA paged. Pt endorsing a lot of pain and tylenol given as ordered. Will continue to monitor.  Clyde Canterbury, RN

## 2020-09-17 DIAGNOSIS — Z7189 Other specified counseling: Secondary | ICD-10-CM

## 2020-09-17 DIAGNOSIS — N179 Acute kidney failure, unspecified: Secondary | ICD-10-CM | POA: Diagnosis not present

## 2020-09-17 DIAGNOSIS — Z515 Encounter for palliative care: Secondary | ICD-10-CM

## 2020-09-17 DIAGNOSIS — C9000 Multiple myeloma not having achieved remission: Secondary | ICD-10-CM | POA: Diagnosis not present

## 2020-09-17 LAB — CBC WITH DIFFERENTIAL/PLATELET
Abs Immature Granulocytes: 0.1 10*3/uL — ABNORMAL HIGH (ref 0.00–0.07)
Basophils Absolute: 0.1 10*3/uL (ref 0.0–0.1)
Basophils Relative: 1 %
Eosinophils Absolute: 0 10*3/uL (ref 0.0–0.5)
Eosinophils Relative: 0 %
HCT: 15.4 % — ABNORMAL LOW (ref 36.0–46.0)
Hemoglobin: 5.2 g/dL — CL (ref 12.0–15.0)
Immature Granulocytes: 1 %
Lymphocytes Relative: 24 %
Lymphs Abs: 3.2 10*3/uL (ref 0.7–4.0)
MCH: 30.1 pg (ref 26.0–34.0)
MCHC: 33.8 g/dL (ref 30.0–36.0)
MCV: 89 fL (ref 80.0–100.0)
Monocytes Absolute: 7.6 10*3/uL — ABNORMAL HIGH (ref 0.1–1.0)
Monocytes Relative: 54 %
Neutro Abs: 2.7 10*3/uL (ref 1.7–7.7)
Neutrophils Relative %: 20 %
Platelets: 63 10*3/uL — ABNORMAL LOW (ref 150–400)
RBC: 1.73 MIL/uL — ABNORMAL LOW (ref 3.87–5.11)
RDW: 15.9 % — ABNORMAL HIGH (ref 11.5–15.5)
WBC: 13.7 10*3/uL — ABNORMAL HIGH (ref 4.0–10.5)
nRBC: 1.2 % — ABNORMAL HIGH (ref 0.0–0.2)

## 2020-09-17 LAB — PREPARE RBC (CROSSMATCH)

## 2020-09-17 LAB — PREPARE FRESH FROZEN PLASMA: Unit division: 0

## 2020-09-17 LAB — COMPREHENSIVE METABOLIC PANEL
ALT: 18 U/L (ref 0–44)
AST: 22 U/L (ref 15–41)
Albumin: 2.5 g/dL — ABNORMAL LOW (ref 3.5–5.0)
Alkaline Phosphatase: 44 U/L (ref 38–126)
Anion gap: 7 (ref 5–15)
BUN: 46 mg/dL — ABNORMAL HIGH (ref 8–23)
CO2: 21 mmol/L — ABNORMAL LOW (ref 22–32)
Calcium: 6.7 mg/dL — ABNORMAL LOW (ref 8.9–10.3)
Chloride: 109 mmol/L (ref 98–111)
Creatinine, Ser: 3.53 mg/dL — ABNORMAL HIGH (ref 0.44–1.00)
GFR, Estimated: 12 mL/min — ABNORMAL LOW (ref >60)
Glucose, Bld: 172 mg/dL — ABNORMAL HIGH (ref 70–99)
Potassium: 4.9 mmol/L (ref 3.5–5.1)
Sodium: 137 mmol/L (ref 135–145)
Total Bilirubin: 0.7 mg/dL (ref 0.3–1.2)
Total Protein: 4.9 g/dL — ABNORMAL LOW (ref 6.5–8.1)

## 2020-09-17 LAB — GLUCOSE, CAPILLARY
Glucose-Capillary: 107 mg/dL — ABNORMAL HIGH (ref 70–99)
Glucose-Capillary: 155 mg/dL — ABNORMAL HIGH (ref 70–99)
Glucose-Capillary: 104 mg/dL — ABNORMAL HIGH (ref 70–99)
Glucose-Capillary: 128 mg/dL — ABNORMAL HIGH (ref 70–99)

## 2020-09-17 LAB — BPAM FFP
Blood Product Expiration Date: 202204032359
Blood Product Expiration Date: 202204032359
ISSUE DATE / TIME: 202203290828
ISSUE DATE / TIME: 202203291048
Unit Type and Rh: 6200
Unit Type and Rh: 6200

## 2020-09-17 MED ORDER — SODIUM CHLORIDE 0.9% IV SOLUTION: Freq: Once | INTRAVENOUS | Status: AC

## 2020-09-17 MED ORDER — SODIUM CHLORIDE 0.9% IV SOLUTION
Freq: Once | INTRAVENOUS | Status: AC
  Administered 2020-09-17: 50 mL via INTRAVENOUS

## 2020-09-17 MED ORDER — VANCOMYCIN HCL 1000 MG/200ML IV SOLN
1000.0000 mg | INTRAVENOUS | Status: AC
  Filled 2020-09-17: qty 200

## 2020-09-17 MED ORDER — HYDROMORPHONE HCL 1 MG/ML IJ SOLN
0.5000 mg | INTRAMUSCULAR | Status: DC | PRN
  Administered 2020-09-17 – 2020-09-19 (×8): 0.5 mg via INTRAVENOUS
  Filled 2020-09-17 (×8): qty 1

## 2020-09-17 NOTE — Progress Notes (Addendum)
Pt for OR tomorrow for washout and possible closure of wounds.  Pt has been pre-op'ed-npo/consent/labs ordered.   Leontine Locket, [facility] 09/17/2020 10:27 AM   Addendum:    Wounds of left arm inspected after dressing removed.  Venous oozing from proximal incision underneath staples as well as fasciotomy site.  Wet to dry placed and moderate pressure dressing placed back on left arm.  Left hand is warm and well perfused.  Hgb 5.2 on labs today.  Will transfuse 3 units PRBC's.  Plan for OR tomorrow.  Leontine Locket, [facility] 09/17/2020 11:52 AM

## 2020-09-17 NOTE — Progress Notes (Signed)
Patient refused morning labs. Will attempt to get them drawn later.

## 2020-09-17 NOTE — Progress Notes (Signed)
PROGRESS NOTE    Madeline Burke  OMV:[id number] DOB: 02/24/1934 DOA: 09/10/2020 PCP: Leeroy Cha, MD    Chief Complaint  Patient presents with  . Abdominal Pain    Brief Narrative:  87 year old with prior h/o hypertension, hyperlipidemia, DM, angioedema, recently diagnosed multiple myeloma presented to the [facility] long hospital with abdominal pain.   She was found to have acute kidney injury, metabolic acidosis, creatinine worsened from baseline creatinine of 1 -2.5.  Patient had confusion and a blood gas was drawn from the left brachial artery that resulted in arterial laceration and compartment syndrome and was transferred to [facility] with vascular surgery.   Patient underwent exploration of the left brachial artery and fascia to me with wound VAC placement on 3/28. Remains in the hospital with multiple issues, worsening renal functions and mental status. Patient also received a dose of dexamethasone and Velcade with oncology.  Assessment & Plan:   Principal Problem:   AKI (acute kidney injury) ([place]) Active Problems:   Abdominal pain, epigastric   HTN (hypertension)   Diabetes ([place])   Anemia   ARF (acute renal failure) ([facility])     Left forearm compartment syndrome: Left upper extremity hematoma, due to left brachial artery injury with compartment syndrome secondary to arterial injury while doing arterial puncture.   Vascular surgery consulted and she underwent exploration of the left brachial artery and placement of VAC dressing on 3/28.  Adequate pain control.  Patient is still with severe pain, will add injectable Dilaudid for pain control and to help with dressing. Follow vascular surgery, plan for surgical closure on 3/31. Continue mobility, distal neurovascular status intact.  AKI with hyperkalemia and metabolic acidosis:  Secondary to multiple myeloma.  Urine output is only 300 mL last 24 hours. Renal functions continues to worsen.  Recent normal  renal functions.  Now with worsening kidney functions and multiple issues, unsure about recovery. Decided to continue to treat for now.  Will discuss with nephrology for consultation. Continue intake and output monitoring.  Acute anemia of blood loss from left upper extremity hematoma and surgical exploration of the left brachial artery.  Anemia of chronic disease and bone marrow involvement. S/p 3 units of prbc transfusion and 2 units of FFP 3/28 Hemoglobin 5.4 today, 3 more unit of PRBC today.  Consented.  Leukocytosis Probably reactive.  Lactic acid is negative.  Continue to monitor.   Thrombocytopenia:  Platelets on admission 148,000 dropping now.  No evidence of DIC.  Syncope:  Initially suspected possible vasovagal or neuro cardiogenic in the setting of severe pain.  Pt had an episode of tongue biting, suspicious for seizures,. EEG ordered and pending.  Initial CT head and MRI brain negative for acute infarct.  Continue with ativan PRN for seizures.   Multiple Myeloma light chain disease:  S/p first dose of Velcade on Friday 09/12/20.  Not sure patient is a treatment candidate, start palliative conversation.  Will consult palliative care team.  Oncology following.  Hypertension: Well controlled. Hold amlodipine. Continue with coreg.     Type 2 Diabetes Mellitus:  CBG (last 3)  Recent Labs    09/16/20 2056 09/17/20 0615 09/17/20 1151  GLUCAP 132* 107* 155*   Resumed SSI.  Fairly stable.  Hyperlipidemia:  Continue with crestor.    DVT prophylaxis: Scd's Code Status: fULL CODE.  Family Communication: Patient's daughter at the bedside. Disposition:   Status is: Inpatient  Remains inpatient appropriate because:Ongoing diagnostic testing needed not appropriate for outpatient  work up and IV treatments appropriate due to intensity of illness or inability to take PO   Dispo: The patient is from: Home              Anticipated d/c is to: pending               Patient currently is not medically stable to d/c.   Difficult to place patient No       Consultants:   Vascular surgery  Surgery.   Nephrology  Palliative medicine  Procedures:  Brachial artery exploration/ repair , placement of VAC dressing.   Antimicrobials:  Antibiotics Given (last 72 hours)    Date/Time Action Medication Dose   09/14/20 2112 Given   acyclovir (ZOVIRAX) 200 MG capsule 200 mg 200 mg   09/15/20 2204 Given   acyclovir (ZOVIRAX) 200 MG capsule 200 mg 200 mg   09/16/20 2229 Given   acyclovir (ZOVIRAX) 200 MG capsule 200 mg 200 mg   09/16/20 2208 Given   acyclovir (ZOVIRAX) 200 MG capsule 200 mg 200 mg   09/17/20 7989 Given   acyclovir (ZOVIRAX) 200 MG capsule 200 mg 200 mg      Subjective: Patient seen and examined.  Her daughter was at the bedside.  Patient was slightly confused overnight.  On my evaluation, she denied any complaint.  She was just focused on not able to get breakfast in time.  According to her daughter, she is slightly impulsive but otherwise at her baseline.  Complains of pain on the left arm.  Objective: Vitals:   09/17/20 1121 09/17/20 1215 09/17/20 1230 09/17/20 1245  BP:  (!) 102/50  (!) 90/45  Pulse: (P) 78 74  80  Resp:  _0 Temp: (P) 98.3 F (36.8 C) 98.4 F (36.9 C)  98.8 F (37.1 C)  TempSrc: (P) Oral Oral  Oral  SpO2:  99%  98%  Weight:      Height:        Intake/Output Summary (Last 24 hours) at 09/17/2020 1521 Last data filed at 09/17/2020 1232 Gross per 24 hour  Intake 682 ml  Output 300 ml  Net 382 ml   Filed Weights   09/10/20 1829  Weight: 56.7 kg    Examination:  General exam: Chronically sick looking.  In mild distress with pain.  Anxious.  Respiratory system: Clear to auscultation. Respiratory effort normal. Cardiovascular system: S1 & S2 heard, RRR. No JVD,  No pedal edema. Gastrointestinal system: Abdomen is nondistended, soft and nontender. Normal bowel sounds heard. Central nervous  system: Alert and oriented x2-3.  Moves all extremities. Extremities: left  upper extremity swelling, bandaged.  Distal neurovascular status intact. Skin: No rashes Psychiatry:  Mood & affect flat and anxious.    Data Reviewed: I have personally reviewed following labs and imaging studies  CBC: Recent Labs  Lab 09/10/20 1910 09/11/20 0527 09/13/20 2325 09/14/20 0516 09/15/20 0443 09/15/20 1027 09/15/20 1229 09/16/20 0530 09/17/20 1033  WBC 7.1   < > 8.4 8.3 18.8* 21.7*  --  13.2* 13.7*  NEUTROABS 1.8  --  5.0  --   --   --   --   --  2.7  HGB 8.6*   < > 7.0* 7.2* 5.4* 5.6* 5.1* 9.4* 5.2*  HCT 26.7*   < > 22.1* 23.0* 16.5* 17.8* 15.0* 26.9* 15.4*  MCV 89.9   < > 88.8 92.4 87.8 90.4  --  84.9 89.0  PLT 139*   < > 110*  100* 101* 101*  --  66* 63*   < > = values in this interval not displayed.    Basic Metabolic Panel: Recent Labs  Lab 09/14/20 0516 09/15/20 0443 09/15/20 1027 09/15/20 1229 09/16/20 0530 09/17/20 1033  NA 143 140 142 143 139 137  K 4.3 4.9 5.4* 5.1 5.4* 4.9  CL 116* 114* 113* 113* 112* 109  CO2 16* 20* 20*  --  18* 21*  GLUCOSE 145* 218* 224* 198* 200* 172*  BUN 27* 32* 31* 33* 34* 46*  CREATININE 1.83* 1.88* 2.13* 2.30* 2.33* 3.53*  CALCIUM 8.2* 7.6* 7.7*  --  7.1* 6.7*  MG  --  1.8  --   --   --   --     GFR: Estimated Creatinine Clearance: 9.2 mL/min (A) (by C-G formula based on SCr of 3.53 mg/dL (H)).  Liver Function Tests: Recent Labs  Lab 09/13/20 0619 09/13/20 2325 09/14/20 0516 09/15/20 0443 09/17/20 1033  AST 24 91* 91* 59* 22  ALT 13 38 33 32 18  ALKPHOS 46 52 55 56 44  BILITOT 0.4 0.6 0.6 0.4 0.7  PROT 6.2* 6.4* 6.4* 5.6* 4.9*  ALBUMIN 3.6 3.8 3.7 3.1* 2.5*    CBG: Recent Labs  Lab 09/16/20 1147 09/16/20 1617 09/16/20 2056 09/17/20 0615 09/17/20 1151  GLUCAP 165* 139* 132* 107* 155*     Recent Results (from the past 240 hour(s))  SARS CORONAVIRUS 2 (TAT 6-24 HRS) Nasopharyngeal Nasopharyngeal Swab     Status:  None   Collection Time: 09/10/20 10:42 PM   Specimen: Nasopharyngeal Swab  Result Value Ref Range Status   SARS Coronavirus 2 NEGATIVE NEGATIVE Final    Comment: (NOTE) SARS-CoV-2 target nucleic acids are NOT DETECTED.  The SARS-CoV-2 RNA is generally detectable in upper and lower respiratory specimens during the acute phase of infection. Negative results do not preclude SARS-CoV-2 infection, do not rule out co-infections with other pathogens, and should not be used as the sole basis for treatment or other patient management decisions. Negative results must be combined with clinical observations, patient history, and epidemiological information. The expected result is Negative.  Fact Sheet for Patients: SugarRoll.be  Fact Sheet for Healthcare Providers: https://www.woods-mathews.com/  This test is not yet approved or cleared by the [place] FDA and  has been authorized for detection and/or diagnosis of SARS-CoV-2 by FDA under an Emergency Use Authorization (EUA). This EUA will remain  in effect (meaning this test can be used) for the duration of the COVID-19 declaration under Se ction 564(b)(1) of the Act, 21 U.S.C. section 360bbb-3(b)(1), unless the authorization is terminated or revoked sooner.  Performed at [facility], [place] [address] [place], [place] [address]          Radiology Studies: VAS [place] UPPER EXTREMITY VENOUS DUPLEX  Result Date: 09/16/2020 UPPER VENOUS STUDY  Indications: Pain, and Swelling Limitations: Body habitus and patient cooperation. Performing Technologist: Antonieta Pert RDMS, RVT  Examination Guidelines: A complete evaluation includes B-mode imaging, spectral Doppler, color Doppler, and power Doppler as needed of all accessible portions of each vessel. Bilateral testing is considered an integral part of a complete examination. Limited examinations for reoccurring indications may be performed as  noted.  Right Findings: +----------+------------+---------+-----------+----------+--------------------+ RIGHT     CompressiblePhasicitySpontaneousProperties      Summary        +----------+------------+---------+-----------+----------+--------------------+ IJV           Full       Yes  Yes                                   +----------+------------+---------+-----------+----------+--------------------+ Subclavian               Yes       Yes              distal iv cath noted +----------+------------+---------+-----------+----------+--------------------+ Axillary      Full       Yes       Yes                                   +----------+------------+---------+-----------+----------+--------------------+ Brachial      Full                                                       +----------+------------+---------+-----------+----------+--------------------+ Radial        Full                                                       +----------+------------+---------+-----------+----------+--------------------+ Ulnar                                                  Not visualized    +----------+------------+---------+-----------+----------+--------------------+ Cephalic      Full                                                       +----------+------------+---------+-----------+----------+--------------------+ Basilic                                                Not visualized    +----------+------------+---------+-----------+----------+--------------------+ intersitial fluid noted in right arm  Left Findings: +----------+------------+---------+-----------+----------+-------+ LEFT      CompressiblePhasicitySpontaneousPropertiesSummary +----------+------------+---------+-----------+----------+-------+ Subclavian               Yes       Yes                      +----------+------------+---------+-----------+----------+-------+  Summary:  Right:  No evidence of deep vein thrombosis in the upper extremity. No evidence of superficial vein thrombosis in the upper extremity.  Left: No evidence of thrombosis in the subclavian.  *See table(s) above for measurements and observations.  Diagnosing physician: Ruta Hinds MD Electronically signed by Ruta Hinds MD on 09/16/2020 at 3:07:46 PM.    Final         Scheduled Meds: . (feeding supplement) PROSource Plus  30 mL Oral BID BM  . sodium chloride   Intravenous Once  . acyclovir  200 mg Oral BID  . carvedilol  6.25 mg  Oral BID WC  . cycloSPORINE  1 drop Both Eyes BID  . dexamethasone (DECADRON) injection  10 mg Intravenous Weekly  . feeding supplement (GLUCERNA SHAKE)  237 mL Oral Q24H  . insulin aspart  0-9 Units Subcutaneous TID WC  . multivitamin with minerals  1 tablet Oral Daily  . pantoprazole  40 mg Oral Daily  . rosuvastatin  10 mg Oral Daily  . sodium bicarbonate  650 mg Oral BID  . sodium chloride flush  3 mL Intravenous Q12H  . sodium zirconium cyclosilicate  10 g Oral BID   Continuous Infusions: . [START ON 09/18/2020] vancomycin       LOS: 6 days    Time spent: 35 minutes    Barb Merino, MD [facility]   09/17/2020, 3:21 PM

## 2020-09-17 NOTE — Consult Note (Signed)
[facility]  INPATIENT CONSULTATION  Reason for Consultation: AKI on CKD Requesting Provider: Dr. Sloan Leiter  HPI: Madeline Burke is an 87 y.o. female with HTN, HL, DM, angioedema, recently diagnosed kappa LC multiple myeloma who is seen for evaluation and management of AKI on CKD.   Admitted since 09/10/20 when she came in with abdominal pain (has been having for a few months).  She had an inpatient consultation with hematology/oncology 09/12/20 for the newly dx MM.  She was offered hospice vs treatment and family elected treatment which was initiated with decacron and velcade.  She had AMS 09/13/20 and ABG collected from brachial artery.  Went on to develop compartment syndrome at that site due to hematoma requiring evacuation and fasciotomy with wound VAC placement on 3/28.  No intraop hypotension.  She required 3u pRBC transfusion. Hb improved yesterday to 9.4 but today 5.2 again. She is receiving 2u pRBC this afternoon.   She's also had a few sycopal events during the hospitalization.  MRI brain ok. EEG was unrevealing for seizure.  BPs were initially in the 140-150s but in the past 24-48h she's had BP in the 100-110s and 90/40s today.  UOP was initially around 1L/d but decreased to 200 on 3/28 and 312m yesterday.   No contrast and no NSAIDs.    On presentation 3/23 Cr 2.6 and improved to 1.8 as of 3/28 but has since trended up to 2.3 yesterday and 3.5 today.  [facility] last visit was 08/06/20. Cr was 0.929mdL at that time. In the past year it had ranged 0.9-1.52m43mL.   At initial hematology visit 08/25/20 cr 1.97.   She cannot provide me any history currently.  Her daughter is bedside and said she had rec'd some dilaudid for severe arm pain.  She's awake just not wanting to answer questions.  She's eating some -- drank 2 cups of water today and ate some food.  She's lost about 40-60lbs in the past 6 or so months.    PMH: Past Medical History:  Diagnosis  Date  . Diabetes mellitus without complication ([place] . Elevated cholesterol   . History of angioedema 2006  . Hypertension    PSH: Past Surgical History:  Procedure Laterality Date  . ABDOMINAL HYSTERECTOMY  1997  . APPLICATION OF WOUND VAC Left 09/15/2020   Procedure: APPLICATION OF WOUND VAC;  Surgeon: FieElam DutchD;  [facility];  Laterality: Left;  . BREAST CYST EXCISION Right 1997  . FASCIOTOMY Left 09/15/2020   Procedure: LIMITED DORSO-LATERAL LEFT ARM FASCIOTOMY;  Surgeon: FieElam DutchD;  [facility];  Service: Vascular;  Laterality: Left;  . HEMATOMA EVACUATION Left 09/15/2020   Procedure: EXPLORATION OF LEFT BRACHIAL ARTERY;  Surgeon: FieElam DutchD;  [facility];  Laterality: Left;  . VMarland KitchenNOUS ABLATION  04/06/2013   right leg      Past Medical History:  Diagnosis Date  . [place] without [place] ([place] . Elevated cholesterol   . History of angioedema 2006  . Hypertension     Medications:  I have reviewed the patient's current medications.  Medications Prior to Admission  Medication Sig Dispense Refill  . albuterol (PROVENTIL) (2.5 MG/3ML) 0.083% nebulizer solution Inhale 3 mLs into the lungs every 6 (six) hours as needed for wheezing or shortness of breath.    . aMarland Kitchenbuterol (VENTOLIN HFA) 108 (90 Base) MCG/ACT inhaler Inhale  2 puffs into the lungs every 6 (six) hours as needed for wheezing or shortness of breath.    Marland Kitchen amLODipine (NORVASC) 2.5 MG tablet Take 2.5 mg by mouth daily.  1  . carvedilol (COREG) 25 MG tablet Take 25 mg by mouth 2 (two) times daily with a meal.   2  . Cholecalciferol (VITAMIN D3) 2000 units TABS Take 2,000 Units by mouth daily.    . cycloSPORINE (RESTASIS) 0.05 % ophthalmic emulsion Place 1 drop into both eyes 2 (two) times daily.     Marland Kitchen denosumab (PROLIA) 60 MG/ML SOSY injection Inject 60 mg into the skin every 6 (six) months.    . EPINEPHrine 0.3 mg/0.3 mL  IJ SOAJ injection Inject 0.3 mLs into the muscle as needed for anaphylaxis.    Marland Kitchen mirtazapine (REMERON) 15 MG tablet Take 15 mg by mouth at bedtime.    . Multiple Vitamin (MULTIVITAMIN WITH MINERALS) TABS tablet Take 1 tablet by mouth daily. Centrum Silver    . rosuvastatin (CRESTOR) 10 MG tablet Take 10 mg by mouth daily.    . fexofenadine (ALLEGRA) 180 MG tablet Take 1 tablet (180 mg total) by mouth daily. (Patient not taking: Reported on 09/10/2020) 90 tablet 5  . triamcinolone cream (KENALOG) 0.1 % Apply 1 application topically 2 (two) times daily. (Patient not taking: Reported on 09/10/2020) 60 g 0    ALLERGIES:   Allergies  Allergen Reactions  . Aspirin Hives  . Bee Venom Hives  . Famotidine Other (See Comments)    "mouth irritated" Other reaction(s): Other (See Comments) "mouth irritated"  . Fish Allergy Hives  . Fish-Derived Products Other (See Comments)  . Lisinopril Cough    Other reaction(s): Cough (ALLERGY/intolerance)  . Other Hives  . Penicillamine Hives  . Penicillins Hives  . Shellfish Allergy Hives  . Sulfa Antibiotics Hives  . Sulfasalazine Hives  . Flagyl [Metronidazole] Rash    FAM HX: Family History  Problem Relation Age of Onset  . Diabetes Other   . Hyperlipidemia Other   . Hypertension Other     Social History:   reports that she has never smoked. She has never used smokeless tobacco. She reports previous drug use. She reports that she does not drink alcohol.  ROS: unable to obtain due to patient not answering questions  Blood pressure (!) 90/45, pulse 70, temperature 98.4 F (36.9 C), temperature source Axillary, resp. rate 15, height 5' 2"  (1.575 m), weight 56.7 kg, SpO2 99 %. PHYSICAL EXAM: Gen: nontoxic, lying in bed  Eyes:  anicteric ENT: MMM Neck: supple CV:  RRR Abd:  Soft, nontender Lungs: clear GU: purewick Extr:  LUE post op edema, wound vac;  very trace ankle edema Neuro: awake, alert and talking but refusing to answer questions.   L hand can wiggle 5th digit only Skin: no rashes noted   Results for orders placed or performed during the hospital encounter of 09/10/20 (from the past 48 hour(s))  Glucose, capillary     Status: Abnormal   Collection Time: 09/15/20  4:11 PM  Result Value Ref Range   Glucose-Capillary 193 (H) 70 - 99 mg/dL    Comment: Glucose reference range applies only to samples taken after fasting for at least 8 hours.  Glucose, capillary     Status: Abnormal   Collection Time: 09/15/20  9:10 PM  Result Value Ref Range   Glucose-Capillary 199 (H) 70 - 99 mg/dL    Comment: Glucose reference range applies only to samples taken after  fasting for at least 8 hours.  CBC     Status: Abnormal   Collection Time: 09/16/20  5:30 AM  Result Value Ref Range   WBC 13.2 (H) 4.0 - 10.5 K/uL   RBC 3.17 (L) 3.87 - 5.11 MIL/uL   Hemoglobin 9.4 (L) 12.0 - 15.0 g/dL    Comment: REPEATED TO VERIFY POST TRANSFUSION SPECIMEN    HCT 26.9 (L) 36.0 - 46.0 %   MCV 84.9 80.0 - 100.0 fL   MCH 29.7 26.0 - 34.0 pg   MCHC 34.9 30.0 - 36.0 g/dL   RDW 15.8 (H) 11.5 - 15.5 %   Platelets 66 (L) 150 - 400 K/uL    Comment: Immature Platelet Fraction may be clinically indicated, consider ordering this additional test [id number] CONSISTENT WITH PREVIOUS RESULT    nRBC 1.1 (H) 0.0 - 0.2 %    Comment: Performed at [facility], [address] [place], [place] [address]  Basic metabolic panel     Status: Abnormal   Collection Time: 09/16/20  5:30 AM  Result Value Ref Range   Sodium 139 135 - 145 mmol/L   Potassium 5.4 (H) 3.5 - 5.1 mmol/L   Chloride 112 (H) 98 - 111 mmol/L   CO2 18 (L) 22 - 32 mmol/L   Glucose, Bld 200 (H) 70 - 99 mg/dL    Comment: Glucose reference range applies only to samples taken after fasting for at least 8 hours.   BUN 34 (H) 8 - 23 mg/dL   Creatinine, Ser 2.33 (H) 0.44 - 1.00 mg/dL   Calcium 7.1 (L) 8.9 - 10.3 mg/dL   GFR, Estimated 20 (L) >60 mL/min    Comment: (NOTE) Calculated using the  CKD-EPI Creatinine Equation (2021)    Anion gap 9 5 - 15    Comment: Performed at [facility] [address], [place], [place] [address]  Protime-INR     Status: Abnormal   Collection Time: 09/16/20  5:30 AM  Result Value Ref Range   Prothrombin Time 16.1 (H) 11.4 - 15.2 seconds   INR 1.4 (H) 0.8 - 1.2    Comment: (NOTE) INR goal varies based on device and disease states. Performed at [facility], [place] [address] [place], [place] [address]   APTT     Status: Abnormal   Collection Time: 09/16/20  5:30 AM  Result Value Ref Range   aPTT 41 (H) 24 - 36 seconds    Comment:        IF BASELINE aPTT IS ELEVATED, SUGGEST PATIENT RISK ASSESSMENT BE USED TO DETERMINE APPROPRIATE ANTICOAGULANT THERAPY. Performed at [facility], [place] [address], [place], [place] [address]   Glucose, capillary     Status: Abnormal   Collection Time: 09/16/20  6:09 AM  Result Value Ref Range   Glucose-Capillary 192 (H) 70 - 99 mg/dL    Comment: Glucose reference range applies only to samples taken after fasting for at least 8 hours.  Prepare fresh frozen plasma     Status: None   Collection Time: 09/16/20  6:28 AM  Result Value Ref Range   Unit Number [id number]    Blood Component Type THW PLS APHR    Unit division B0    Status of Unit ISSUED,FINAL    Transfusion Status OK TO TRANSFUSE    Unit Number [id number]    Blood Component Type THAWED PLASMA    Unit division 00    Status of Unit ISSUED,FINAL  Transfusion Status      OK TO TRANSFUSE Performed at [facility], [place] [address] [place], [place] [address]   Glucose, capillary     Status: Abnormal   Collection Time: 09/16/20 11:47 AM  Result Value Ref Range   Glucose-Capillary 165 (H) 70 - 99 mg/dL    Comment: Glucose reference range applies only to samples taken after fasting for at least 8 hours.  Hemoglobin A1c     Status: Abnormal   Collection Time: 09/16/20  2:15 PM  Result Value Ref Range   Hgb A1c MFr  Bld 6.0 (H) 4.8 - 5.6 %    Comment: (NOTE) Pre diabetes:          5.7%-6.4%  Diabetes:              >6.4%  Glycemic control for   <7.0% adults with diabetes    Mean Plasma Glucose 125.5 mg/dL    Comment: Performed at [facility] [address] [place], [place] [address]  Fibrinogen     Status: None   Collection Time: 09/16/20  2:15 PM  Result Value Ref Range   Fibrinogen 415 210 - 475 mg/dL    Comment: Performed at [facility] [address] [place], [place] [address]  Glucose, capillary     Status: Abnormal   Collection Time: 09/16/20  4:17 PM  Result Value Ref Range   Glucose-Capillary 139 (H) 70 - 99 mg/dL    Comment: Glucose reference range applies only to samples taken after fasting for at least 8 hours.  Glucose, capillary     Status: Abnormal   Collection Time: 09/16/20  8:56 PM  Result Value Ref Range   Glucose-Capillary 132 (H) 70 - 99 mg/dL    Comment: Glucose reference range applies only to samples taken after fasting for at least 8 hours.  Glucose, capillary     Status: Abnormal   Collection Time: 09/17/20  6:15 AM  Result Value Ref Range   Glucose-Capillary 107 (H) 70 - 99 mg/dL    Comment: Glucose reference range applies only to samples taken after fasting for at least 8 hours.   Comment 1 Notify RN    Comment 2 Document in Chart   CBC with Differential/Platelet     Status: Abnormal   Collection Time: 09/17/20 10:33 AM  Result Value Ref Range   WBC 13.7 (H) 4.0 - 10.5 K/uL   RBC 1.73 (L) 3.87 - 5.11 MIL/uL   Hemoglobin 5.2 (LL) 12.0 - 15.0 g/dL    Comment: REPEATED TO VERIFY THIS CRITICAL RESULT HAS VERIFIED AND BEEN CALLED TO T BLACK RN BY ALLISON BENNETT ON 03 30 2022 AT 1124, AND HAS BEEN READ BACK.     HCT 15.4 (L) 36.0 - 46.0 %   MCV 89.0 80.0 - 100.0 fL   MCH 30.1 26.0 - 34.0 pg   MCHC 33.8 30.0 - 36.0 g/dL   RDW 15.9 (H) 11.5 - 15.5 %   Platelets 63 (L) 150 - 400 K/uL    Comment: Immature Platelet Fraction may be clinically  indicated, consider ordering this additional test [id number] CONSISTENT WITH PREVIOUS RESULT REPEATED TO VERIFY    nRBC 1.2 (H) 0.0 - 0.2 %   Neutrophils Relative % 20 %   Neutro Abs 2.7 1.7 - 7.7 K/uL   Lymphocytes Relative 24 %   Lymphs Abs 3.2 0.7 - 4.0 K/uL   Monocytes Relative 54 %   Monocytes Absolute 7.6 (H)  0.1 - 1.0 K/uL   Eosinophils Relative 0 %   Eosinophils Absolute 0.0 0.0 - 0.5 K/uL   Basophils Relative 1 %   Basophils Absolute 0.1 0.0 - 0.1 K/uL   WBC Morphology See Note     Comment: Plasma Cells   Immature Granulocytes 1 %   Abs Immature Granulocytes 0.10 (H) 0.00 - 0.07 K/uL    Comment: Performed at [facility] [address], [place], [place] [address]  Comprehensive metabolic panel     Status: Abnormal   Collection Time: 09/17/20 10:33 AM  Result Value Ref Range   Sodium 137 135 - 145 mmol/L   Potassium 4.9 3.5 - 5.1 mmol/L   Chloride 109 98 - 111 mmol/L   CO2 21 (L) 22 - 32 mmol/L   Glucose, Bld 172 (H) 70 - 99 mg/dL    Comment: Glucose reference range applies only to samples taken after fasting for at least 8 hours.   BUN 46 (H) 8 - 23 mg/dL    Comment: RESULT REPEATED AND VERIFIED   Creatinine, Ser 3.53 (H) 0.44 - 1.00 mg/dL    Comment: RESULT REPEATED AND VERIFIED   Calcium 6.7 (L) 8.9 - 10.3 mg/dL   Total Protein 4.9 (L) 6.5 - 8.1 g/dL   Albumin 2.5 (L) 3.5 - 5.0 g/dL   AST 22 15 - 41 U/L   ALT 18 0 - 44 U/L   Alkaline Phosphatase 44 38 - 126 U/L   Total Bilirubin 0.7 0.3 - 1.2 mg/dL   GFR, Estimated 12 (L) >60 mL/min    Comment: (NOTE) Calculated using the CKD-EPI Creatinine Equation (2021)    Anion gap 7 5 - 15    Comment: Performed at [facility] [address] [place], [place] [address]  Prepare RBC (crossmatch)     Status: None   Collection Time: 09/17/20 11:44 AM  Result Value Ref Range   Order Confirmation      ORDER PROCESSED BY BLOOD BANK Performed at [facility], [place] [address], [place], [place] [address]    Prepare RBC (crossmatch)     Status: None   Collection Time: 09/17/20 11:49 AM  Result Value Ref Range   Order Confirmation      ORDER PROCESSED BY BLOOD BANK BB SAMPLE OR UNITS ALREADY AVAILABLE Performed at [facility], [place] [address] [place], [place] [address]   Glucose, capillary     Status: Abnormal   Collection Time: 09/17/20 11:51 AM  Result Value Ref Range   Glucose-Capillary 155 (H) 70 - 99 mg/dL    Comment: Glucose reference range applies only to samples taken after fasting for at least 8 hours.    VAS [place] UPPER EXTREMITY VENOUS DUPLEX  Result Date: 09/16/2020 UPPER VENOUS STUDY  Indications: Pain, and Swelling Limitations: Body habitus and patient cooperation. Performing Technologist: Antonieta Pert RDMS, RVT  Examination Guidelines: A complete evaluation includes B-mode imaging, spectral Doppler, color Doppler, and power Doppler as needed of all accessible portions of each vessel. Bilateral testing is considered an integral part of a complete examination. Limited examinations for reoccurring indications may be performed as noted.  Right Findings: +----------+------------+---------+-----------+----------+--------------------+ RIGHT     CompressiblePhasicitySpontaneousProperties      Summary        +----------+------------+---------+-----------+----------+--------------------+ IJV           Full       Yes       Yes                                   +----------+------------+---------+-----------+----------+--------------------+  Subclavian               Yes       Yes              distal iv cath noted +----------+------------+---------+-----------+----------+--------------------+ Axillary      Full       Yes       Yes                                   +----------+------------+---------+-----------+----------+--------------------+ Brachial      Full                                                        +----------+------------+---------+-----------+----------+--------------------+ Radial        Full                                                       +----------+------------+---------+-----------+----------+--------------------+ Ulnar                                                  Not visualized    +----------+------------+---------+-----------+----------+--------------------+ Cephalic      Full                                                       +----------+------------+---------+-----------+----------+--------------------+ Basilic                                                Not visualized    +----------+------------+---------+-----------+----------+--------------------+ intersitial fluid noted in right arm  Left Findings: +----------+------------+---------+-----------+----------+-------+ LEFT      CompressiblePhasicitySpontaneousPropertiesSummary +----------+------------+---------+-----------+----------+-------+ Subclavian               Yes       Yes                      +----------+------------+---------+-----------+----------+-------+  Summary:  Right: No evidence of deep vein thrombosis in the upper extremity. No evidence of superficial vein thrombosis in the upper extremity.  Left: No evidence of thrombosis in the subclavian.  *See table(s) above for measurements and observations.  Diagnosing physician: Ruta Hinds MD Electronically signed by Ruta Hinds MD on 09/16/2020 at 3:07:46 PM.    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  I suspect this is related to hemodynamic insults with modest hypotension, ABLA.  She appears euvolemic.  She's receiving pRBC so I will not give IVF at this time since she's taking po.  At this point she doesn't have indications for  dialysis and hopefully she won't develop. I discussed this with daughter.  Continue supportive care. Avoid further insults including hypotension, hypovolemia, nephrotoxins.  Follow I/Os.   **Multiple myeloma: heme/onc has initiated treatment with velcade and decadron - 1st cycle after recent diagnosis.    **Anemia: multifactorial with a component of ABLA.  Transfusion per primary.  Hematology following for myeloma.   **HTN:  Baseline HTN on coreg 25 BID, amlodipine 2.5 daily at home; amlodipine held and coreg reduced.   Put hold parameter for coreg to hold if < 110.   **Hyperkalemia: being managed with lokelma.   **LUE compartment syndrome: s/p evacuation and fasciotomy; wound vac in place.  Limited use of L hand currently. VVS following.   Will follow closely, page with questions.   Justin Mend 09/17/2020, 3:46 PM

## 2020-09-17 NOTE — Progress Notes (Addendum)
  Progress Note    09/17/2020 7:28 AM 2 Days Post-Op  Subjective:  Crying and saying she is cold.  Afebrile HR 70's-80's NSR 161'W-960'A systolic 540% RA  Vitals:   09/17/20 0023 09/17/20 0300  BP: (!) 117/54 (!) 120/59  Pulse: 77 76  Resp: 18 16  Temp:    SpO2: 97% 98%    Physical Exam: Lungs:  Non labored Incisions:  Left arm dressing reinforced with kerlix.  It is dry. Extremities:  Left fingers painful to touch; unable to do exam on either arm.    CBC    Component Value Date/Time   WBC 13.2 (H) 09/16/2020 0530   RBC 3.17 (L) 09/16/2020 0530   HGB 9.4 (L) 09/16/2020 0530   HGB 9.1 (L) 08/25/2020 1518   HCT 26.9 (L) 09/16/2020 0530   PLT 66 (L) 09/16/2020 0530   PLT 134 (L) 08/25/2020 1518   MCV 84.9 09/16/2020 0530   MCH 29.7 09/16/2020 0530   MCHC 34.9 09/16/2020 0530   RDW 15.8 (H) 09/16/2020 0530   LYMPHSABS 1.5 09/13/2020 2325   MONOABS 1.7 (H) 09/13/2020 2325   EOSABS 0.0 09/13/2020 2325   BASOSABS 0.0 09/13/2020 2325    BMET    Component Value Date/Time   NA 139 09/16/2020 0530   K 5.4 (H) 09/16/2020 0530   CL 112 (H) 09/16/2020 0530   CO2 18 (L) 09/16/2020 0530   GLUCOSE 200 (H) 09/16/2020 0530   BUN 34 (H) 09/16/2020 0530   CREATININE 2.33 (H) 09/16/2020 0530   CREATININE 1.97 (H) 08/25/2020 1518   CALCIUM 7.1 (L) 09/16/2020 0530   GFRNONAA 20 (L) 09/16/2020 0530   GFRNONAA 25 (L) 08/25/2020 1518   GFRAA >60 05/13/2019 0850    INR    Component Value Date/Time   INR 1.4 (H) 09/16/2020 0530     Intake/Output Summary (Last 24 hours) at 09/17/2020 0728 Last data filed at 09/17/2020 0148 Gross per 24 hour  Intake 520 ml  Output 300 ml  Net 220 ml     Assessment:  87 y.o. female is s/p:  Exploration and repair of left brachial artery, placement of VAC dressing  2 Days Post-Op  Plan: -left arm dressing reinforced last evening.  Pt would not really let me examine her this morning.   -no labs this am as pt refused per RN -RUE  duplex revealed no evidence of DVT -DVT prophylaxis:  SCD's -Dr. Oneida Alar to see pt this am   Leontine Locket, PA-C [facility] [phone] 09/17/2020 7:28 AM  Pt refusing exam today.  Not moving left arm any. Dressing dry. D/w daughter importance of getting the arm and hand moving again to prevent permanent deficit.  Pt dementia is limiting some of this.  She may have permanent deficit from this injury and best treatment is to get the arm and hand moving now.  Will have our team return later today to change dressing with premedication.  Plan to return to OR tomorrow to washout fasciotomy and hopefully close some of the wounds  Ruta Hinds, MD [facility] Office: [phone]

## 2020-09-17 NOTE — Progress Notes (Signed)
S: pt lying in bed comfortable except when moving LUE.    O:Blood pressure 111/61, pulse 75, temperature 99 F (37.2 C), temperature source Oral, resp. rate 16, height 5\' 2"  (1.575 m), weight 56.7 kg, SpO2 98 %.  LUE: hand/fingers/arm still swollen, compartments much softer, fasciotomy site less tense/swollen, some movement of fingers, fingers are warm and well perfused. ? sensation  A: s/p forearm fasciotomy/ brachial artery exploration/repair POD #2   P: Vascular to take to OR for attempted closure of wounds in am.  Pain control is an issue with dressing changes and OT.  OT for mobility when pain control allows, cont elevation for now.

## 2020-09-17 NOTE — Progress Notes (Signed)
PT Cancellation Note  Patient Details Name: Madeline Burke MRN: [id number] DOB: 18-Jul-1933   Cancelled Treatment:    Reason Eval/Treat Not Completed: (P) Patient not medically ready (pt critically low Hgb 5.2 and receiving 3 units PRBC, will defer this date.) Will continue efforts next date per PT POC as schedule permits.   [name] [name] 09/17/2020, 2:23 PM

## 2020-09-17 NOTE — Consult Note (Signed)
Consultation Note Date: 09/17/2020   Patient Name: Madeline Burke  DOB: [birth year]  MRN: [id number]  Age / Sex: 87 y.o., female  PCP: Leeroy Cha, MD Referring Physician: Barb Merino, MD  Reason for Consultation: Establishing goals of care  HPI/Patient Profile:  87 y.o. female  with past medical history of dementia, DM 2, HLD, hypertension, recent diagnosis of multiple myeloma with findings of plasma cell neoplasm on bone marrow biopsy-started on Velcade in this admission 3/26- admitted on 09/10/2020 with complaint of abdominal pain and syncope.  She was started on PPI.  Further work-up revealed metabolic acidosis, acute kidney injury.  Admission has been complicated due to developing a large hematoma from a brachial artery catheterization injury, developed compartment syndrome and required emergent fasciotomy.  EEG completed shows diffuse cerebral dysfunction.  Palliative medicine consulted for "goals of care, high risk 2-monthmortality".    Clinical Assessment and Goals of Care: Evaluated patient she was asleep did not wake up for goals of care discussion. Chart reviewed-prior to this admission patient was living at home somewhat independent, she completed her ADLs of dressing and bathing on her own.  She walks with a cane.  Her daughters help with the rest of her care. A MOST form is documented that was completed on July 14, 2020 with her outpatient primary-  Selections at that time include:   Cardiopulmonary Resuscitation: Attempt Resuscitation (CPR)  Medical Interventions: Full Scope of Treatment: Use intubation, advanced airway interventions, mechanical ventilation, cardioversion as indicated, medical treatment, IV fluids, etc, also provide comfort measures. Transfer to the hospital if indicated  Antibiotics: Antibiotics if indicated  IV Fluids: IV fluids if indicated  Feeding Tube:  Feeding tube for a defined trial period   Attempted to have goals of care meeting today to review MOST form with patient's daughters.  1 daughter was at the bedside and another was planned to meet with me at 232 however second daughter was not able to arrive for meeting.   Primary Decision Maker NEXT OF KIN- Daughters    SUMMARY OF RECOMMENDATIONS -Cont full code, full scope -Plan made to meet tomorrow at 12 with patient's daughters-will review MOST form and discuss goals of care  Code Status/Advance Care Planning:  Full code  Palliative Prophylaxis:   Delirium Protocol  Additional Recommendations (Limitations, Scope, Preferences):  Full Scope Treatment  Prognosis:    Unable to determine  Discharge Planning: To Be Determined  Primary Diagnoses: Present on Admission: . AKI (acute kidney injury) ([facility] . AR[place] (acute renal failure) [place]   I have reviewed the medical record, interviewed the patient and family, and examined the patient. The following aspects are pertinent.  Past Medical History:  Diagnosis Date  . Diabetes mellitus without complication ([facility]   . Elevated cholesterol   . History of angioedema 2006  . Hypertension    Social History   Socioeconomic History  . Marital status: Divorced    Spouse name: Not on file  . Number of children: Not on file  . Years of  education: Not on file  . Highest education level: Not on file  Occupational History  . Not on file  Tobacco Use  . Smoking status: Never Smoker  . Smokeless tobacco: Never Used  Substance and Sexual Activity  . Alcohol use: No  . Drug use: Not Currently  . Sexual activity: Yes    Birth control/protection: Surgical  Other Topics Concern  . Not on file  Social History Narrative  . Not on file   Social Determinants of Health   Financial Resource Strain: Not on file  Food Insecurity: Not on file  Transportation Needs: Not on file  Physical Activity: Not on file  Stress: Not on file   Social Connections: Not on file   Scheduled Meds: . (feeding supplement) PROSource Plus  30 mL Oral BID BM  . sodium chloride   Intravenous Once  . acyclovir  200 mg Oral BID  . carvedilol  6.25 mg Oral BID WC  . cycloSPORINE  1 drop Both Eyes BID  . dexamethasone (DECADRON) injection  10 mg Intravenous Weekly  . feeding supplement (GLUCERNA SHAKE)  237 mL Oral Q24H  . insulin aspart  0-9 Units Subcutaneous TID WC  . multivitamin with minerals  1 tablet Oral Daily  . pantoprazole  40 mg Oral Daily  . rosuvastatin  10 mg Oral Daily  . sodium bicarbonate  650 mg Oral BID  . sodium chloride flush  3 mL Intravenous Q12H  . sodium zirconium cyclosilicate  10 g Oral BID   Continuous Infusions: . [START ON 09/18/2020] vancomycin     PRN Meds:.acetaminophen **OR** acetaminophen, EPINEPHrine, HYDROmorphone (DILAUDID) injection, ipratropium-albuterol, LORazepam, ondansetron **OR** ondansetron (ZOFRAN) IV, oxyCODONE-acetaminophen, polyethylene glycol Medications Prior to Admission:  Prior to Admission medications   Medication Sig Start Date End Date Taking? Authorizing Provider  albuterol (PROVENTIL) (2.5 MG/3ML) 0.083% nebulizer solution Inhale 3 mLs into the lungs every 6 (six) hours as needed for wheezing or shortness of breath. 08/05/20  Yes [provider]  albuterol (VENTOLIN HFA) 108 (90 Base) MCG/ACT inhaler Inhale 2 puffs into the lungs every 6 (six) hours as needed for wheezing or shortness of breath.   Yes [provider]  amLODipine (NORVASC) 2.5 MG tablet Take 2.5 mg by mouth daily. 01/04/18  Yes [provider]  carvedilol (COREG) 25 MG tablet Take 25 mg by mouth 2 (two) times daily with a meal.  04/15/14  Yes [provider]  Cholecalciferol (VITAMIN D3) 2000 units TABS Take 2,000 Units by mouth daily.   Yes [provider]  cycloSPORINE (RESTASIS) 0.05 % ophthalmic emulsion Place 1 drop into both eyes 2 (two) times daily.    Yes [provider]  denosumab (PROLIA) 60 MG/ML SOSY injection Inject 60 mg into the skin every 6 (six) months.   Yes [provider]  EPINEPHrine 0.3 mg/0.3 mL IJ SOAJ injection Inject 0.3 mLs into the muscle as needed for anaphylaxis.   Yes [provider]  mirtazapine (REMERON) 15 MG tablet Take 15 mg by mouth at bedtime.   Yes [provider]  Multiple Vitamin (MULTIVITAMIN WITH MINERALS) TABS tablet Take 1 tablet by mouth daily. Centrum Silver   Yes [provider]  rosuvastatin (CRESTOR) 10 MG tablet Take 10 mg by mouth daily.   Yes [provider]  acyclovir (ZOVIRAX) 400 MG tablet Take 1 tablet (400 mg total) by mouth 2 (two) times daily. 09/12/20   Nicholas Lose, MD  fexofenadine (ALLEGRA) 180 MG tablet Take 1  tablet (180 mg total) by mouth daily. Patient not taking: Reported on 09/10/2020 05/09/18 05/13/19  Valentina Shaggy, MD  lidocaine-prilocaine (EMLA) cream Apply to affected area once 09/12/20   Nicholas Lose, MD  ondansetron (ZOFRAN) 8 MG tablet Take 1 tablet (8 mg total) by mouth 2 (two) times daily as needed (Nausea or vomiting). 09/12/20   Nicholas Lose, MD  prochlorperazine (COMPAZINE) 10 MG tablet Take 1 tablet (10 mg total) by mouth every 6 (six) hours as needed (Nausea or vomiting). 09/12/20   Nicholas Lose, MD  triamcinolone cream (KENALOG) 0.1 % Apply 1 application topically 2 (two) times daily. Patient not taking: Reported on 09/10/2020 01/02/19   Valentina Shaggy, MD   Allergies  Allergen Reactions  . Aspirin Hives  . Bee Venom Hives  . Famotidine Other (See Comments)    "mouth irritated" Other reaction(s): Other (See Comments) "mouth irritated"  . Fish Allergy Hives  . Fish-Derived Products Other (See Comments)  . Lisinopril Cough    Other reaction(s): Cough (ALLERGY/intolerance)  . Other Hives  . Penicillamine Hives  . Penicillins Hives  . Shellfish Allergy Hives  . Sulfa Antibiotics Hives  . Sulfasalazine Hives   . Flagyl [Metronidazole] Rash   Review of Systems  Unable to perform ROS: Dementia    Physical Exam Vitals and nursing note reviewed.  Constitutional:      Comments: Frail appearing  Pulmonary:     Effort: Pulmonary effort is normal.     Vital Signs: BP (!) 90/45   Pulse 80   Temp 98.8 F (37.1 C) (Oral)   Resp 15   Ht 5' 2"  (1.575 m)   Wt 56.7 kg   SpO2 98%   BMI 22.86 kg/m  Pain Scale: 0-10 POSS *See Group Information*: 1-Acceptable,Awake and alert Pain Score: 0-No pain   SpO2: SpO2: 98 % O2 Device:SpO2: 98 % O2 Flow Rate: .O2 Flow Rate (L/min): 2 L/min  IO: Intake/output summary:   Intake/Output Summary (Last 24 hours) at 09/17/2020 1508 Last data filed at 09/17/2020 1232 Gross per 24 hour  Intake 682 ml  Output 300 ml  Net 382 ml    LBM: Last BM Date: 09/13/20 Baseline Weight: Weight: 56.7 kg Most recent weight: Weight: 56.7 kg     Palliative Assessment/Data: PPS:40%     Thank you for this consult. Palliative medicine will continue to follow and assist as needed.   Time In: 1402 Time Out: 1504 Time Total: 62 minutes Greater than 50%  of this time was spent counseling and coordinating care related to the above assessment and plan.  Signed by: Mariana Kaufman, AGNP-C Palliative Medicine    Please contact Palliative Medicine Team phone at [phone] for questions and concerns.  For individual provider: See Shea Evans

## 2020-09-17 NOTE — Plan of Care (Signed)
  Problem: Activity: Goal: Risk for activity intolerance will decrease Outcome: Not Progressing   Problem: Nutrition: Goal: Adequate nutrition will be maintained Outcome: Not Progressing   

## 2020-09-17 NOTE — Progress Notes (Addendum)
OT Cancellation Note  Patient Details Name: Madeline Burke MRN: [id number] DOB: [birth year]   Cancelled Treatment:    Reason Eval/Treat Not Completed: Medical issues which prohibited therapy.  Pt with Hbg 5.2.  Spoke with RN who reports significant oozing/bleeding from Lt UE.  **MD, please advise if any precautions/limitations for Lt UE ROM/activity**  Thank you.  Nilsa Nutting., OTR/L Acute Rehabilitation Services Pager [phone] Office [phone]   Lucille Passy M 09/17/2020, 5:26 PM

## 2020-09-17 NOTE — Progress Notes (Signed)
Critical lab value of hemoglobin 5.4. MD notified by RN. New orders to transfuse 3 units PRBC. Patient and family educated. Blood transfusion has been started. Fuller Canada, RN

## 2020-09-18 ENCOUNTER — Inpatient Hospital Stay (HOSPITAL_COMMUNITY): Payer: Medicare Other | Admitting: Certified Registered Nurse Anesthetist

## 2020-09-18 ENCOUNTER — Encounter (HOSPITAL_COMMUNITY): Payer: Self-pay

## 2020-09-18 ENCOUNTER — Encounter (HOSPITAL_COMMUNITY)
Admission: EM | Disposition: A | Discharge status: Hospice | Payer: Self-pay | Source: Home / Self Care | Attending: Internal Medicine

## 2020-09-18 DIAGNOSIS — N179 Acute kidney failure, unspecified: Secondary | ICD-10-CM | POA: Diagnosis not present

## 2020-09-18 DIAGNOSIS — C9000 Multiple myeloma not having achieved remission: Secondary | ICD-10-CM | POA: Diagnosis not present

## 2020-09-18 DIAGNOSIS — Z7189 Other specified counseling: Secondary | ICD-10-CM | POA: Diagnosis not present

## 2020-09-18 HISTORY — PX: FASCIOTOMY CLOSURE: SHX5829

## 2020-09-18 HISTORY — PX: INCISION AND DRAINAGE: SHX5863

## 2020-09-18 LAB — TYPE AND SCREEN
ABO/RH(D): A POS
Antibody Screen: NEGATIVE
Unit division: 0
Unit division: 0
Unit division: 0
Unit division: 0
Unit division: 0
Unit division: 0

## 2020-09-18 LAB — BPAM RBC
Blood Product Expiration Date: 202204062359
Blood Product Expiration Date: 202204142359
Blood Product Expiration Date: 202204192359
Blood Product Expiration Date: 202204232359
Blood Product Expiration Date: 202204242359
Blood Product Expiration Date: 202204242359
ISSUE DATE / TIME: 202203281204
ISSUE DATE / TIME: 202203281255
ISSUE DATE / TIME: 202203281255
ISSUE DATE / TIME: 202203301217
ISSUE DATE / TIME: 202203301551
ISSUE DATE / TIME: 202203302045
Unit Type and Rh: 6200
Unit Type and Rh: 6200
Unit Type and Rh: 6200
Unit Type and Rh: 6200
Unit Type and Rh: 6200
Unit Type and Rh: 6200

## 2020-09-18 LAB — BASIC METABOLIC PANEL
Anion gap: 8 (ref 5–15)
BUN: 48 mg/dL — ABNORMAL HIGH (ref 8–23)
CO2: 21 mmol/L — ABNORMAL LOW (ref 22–32)
Calcium: 6.5 mg/dL — ABNORMAL LOW (ref 8.9–10.3)
Chloride: 108 mmol/L (ref 98–111)
Creatinine, Ser: 3.81 mg/dL — ABNORMAL HIGH (ref 0.44–1.00)
GFR, Estimated: 11 mL/min — ABNORMAL LOW (ref >60)
Glucose, Bld: 107 mg/dL — ABNORMAL HIGH (ref 70–99)
Potassium: 4.6 mmol/L (ref 3.5–5.1)
Sodium: 137 mmol/L (ref 135–145)

## 2020-09-18 LAB — CBC
HCT: 28.9 % — ABNORMAL LOW (ref 36.0–46.0)
Hemoglobin: 10.1 g/dL — ABNORMAL LOW (ref 12.0–15.0)
MCH: 30.8 pg (ref 26.0–34.0)
MCHC: 34.9 g/dL (ref 30.0–36.0)
MCV: 88.1 fL (ref 80.0–100.0)
Platelets: 51 10*3/uL — ABNORMAL LOW (ref 150–400)
RBC: 3.28 MIL/uL — ABNORMAL LOW (ref 3.87–5.11)
RDW: 15.9 % — ABNORMAL HIGH (ref 11.5–15.5)
WBC: 7.9 10*3/uL (ref 4.0–10.5)
nRBC: 2 % — ABNORMAL HIGH (ref 0.0–0.2)

## 2020-09-18 LAB — GLUCOSE, CAPILLARY
Glucose-Capillary: 115 mg/dL — ABNORMAL HIGH (ref 70–99)
Glucose-Capillary: 126 mg/dL — ABNORMAL HIGH (ref 70–99)
Glucose-Capillary: 145 mg/dL — ABNORMAL HIGH (ref 70–99)
Glucose-Capillary: 226 mg/dL — ABNORMAL HIGH (ref 70–99)
Glucose-Capillary: 147 mg/dL — ABNORMAL HIGH (ref 70–99)

## 2020-09-18 SURGERY — INCISION AND DRAINAGE
Anesthesia: General | Site: Arm Upper | Laterality: Left

## 2020-09-18 MED ORDER — ONDANSETRON HCL 4 MG/2ML IJ SOLN: 4.0000 mg | Freq: Once | INTRAMUSCULAR | Status: DC | PRN

## 2020-09-18 MED ORDER — PHENYLEPHRINE 40 MCG/ML (10ML) SYRINGE FOR IV PUSH (FOR BLOOD PRESSURE SUPPORT)
PREFILLED_SYRINGE | INTRAVENOUS | Status: DC | PRN
  Administered 2020-09-18 (×3): 80 ug via INTRAVENOUS

## 2020-09-18 MED ORDER — PROPOFOL 10 MG/ML IV BOLUS
INTRAVENOUS | Status: AC
  Filled 2020-09-18: qty 20

## 2020-09-18 MED ORDER — ACETAMINOPHEN 10 MG/ML IV SOLN
1000.0000 mg | Freq: Once | INTRAVENOUS | Status: DC | PRN
  Administered 2020-09-18: 1000 mg via INTRAVENOUS

## 2020-09-18 MED ORDER — ACETAMINOPHEN 10 MG/ML IV SOLN
INTRAVENOUS | Status: AC
  Filled 2020-09-18: qty 100

## 2020-09-18 MED ORDER — FENTANYL CITRATE (PF) 250 MCG/5ML IJ SOLN
INTRAMUSCULAR | Status: AC
  Filled 2020-09-18: qty 5

## 2020-09-18 MED ORDER — PHENYLEPHRINE HCL (PRESSORS) 10 MG/ML IV SOLN
INTRAVENOUS | Status: AC
  Filled 2020-09-18: qty 1

## 2020-09-18 MED ORDER — ONDANSETRON HCL 4 MG/2ML IJ SOLN
INTRAMUSCULAR | Status: DC | PRN
  Administered 2020-09-18: 4 mg via INTRAVENOUS

## 2020-09-18 MED ORDER — LIDOCAINE 2% (20 MG/ML) 5 ML SYRINGE
INTRAMUSCULAR | Status: AC
  Filled 2020-09-18: qty 5

## 2020-09-18 MED ORDER — PHENYLEPHRINE HCL-NACL 10-0.9 MG/250ML-% IV SOLN
INTRAVENOUS | Status: DC | PRN
  Administered 2020-09-18: 40 ug/min via INTRAVENOUS

## 2020-09-18 MED ORDER — FENTANYL CITRATE (PF) 100 MCG/2ML IJ SOLN
25.0000 ug | INTRAMUSCULAR | Status: DC | PRN
  Administered 2020-09-18 (×2): 25 ug via INTRAVENOUS

## 2020-09-18 MED ORDER — 0.9 % SODIUM CHLORIDE (POUR BTL) OPTIME
TOPICAL | Status: DC | PRN
  Administered 2020-09-18: 1000 mL

## 2020-09-18 MED ORDER — ROCURONIUM BROMIDE 10 MG/ML (PF) SYRINGE
PREFILLED_SYRINGE | INTRAVENOUS | Status: AC
  Filled 2020-09-18: qty 10

## 2020-09-18 MED ORDER — PHENYLEPHRINE 40 MCG/ML (10ML) SYRINGE FOR IV PUSH (FOR BLOOD PRESSURE SUPPORT)
PREFILLED_SYRINGE | INTRAVENOUS | Status: AC
  Filled 2020-09-18: qty 10

## 2020-09-18 MED ORDER — ENSURE ENLIVE PO LIQD
237.0000 mL | Freq: Three times a day (TID) | ORAL | Status: DC
  Administered 2020-09-19 – 2020-09-20 (×4): 237 mL via ORAL

## 2020-09-18 MED ORDER — FENTANYL CITRATE (PF) 100 MCG/2ML IJ SOLN
INTRAMUSCULAR | Status: AC
  Filled 2020-09-18: qty 2

## 2020-09-18 MED ORDER — HEMOSTATIC AGENTS (NO CHARGE) OPTIME
TOPICAL | Status: DC | PRN
  Administered 2020-09-18: 1 via TOPICAL

## 2020-09-18 MED ORDER — LIDOCAINE 2% (20 MG/ML) 5 ML SYRINGE
INTRAMUSCULAR | Status: DC | PRN
  Administered 2020-09-18: 60 mg via INTRAVENOUS

## 2020-09-18 MED ORDER — ORAL CARE MOUTH RINSE
15.0000 mL | Freq: Two times a day (BID) | OROMUCOSAL | Status: DC
  Administered 2020-09-20: 15 mL via OROMUCOSAL

## 2020-09-18 MED ORDER — FENTANYL CITRATE (PF) 100 MCG/2ML IJ SOLN
INTRAMUSCULAR | Status: DC | PRN
  Administered 2020-09-18: 50 ug via INTRAVENOUS
  Administered 2020-09-18: 25 ug via INTRAVENOUS

## 2020-09-18 MED ORDER — CHLORHEXIDINE GLUCONATE 0.12 % MT SOLN
OROMUCOSAL | Status: AC
  Filled 2020-09-18: qty 15

## 2020-09-18 MED ORDER — PROPOFOL 10 MG/ML IV BOLUS
INTRAVENOUS | Status: DC | PRN
  Administered 2020-09-18: 100 mg via INTRAVENOUS

## 2020-09-18 MED ORDER — SODIUM CHLORIDE 0.9 % IV SOLN: INTRAVENOUS | Status: AC

## 2020-09-18 SURGICAL SUPPLY — 38 items
BAG ISL DRAPE 18X18 STRL (DRAPES)
BAG ISOLATION DRAPE 18X18 (DRAPES) IMPLANT
BNDG ELASTIC 4X5.8 VLCR STR LF (GAUZE/BANDAGES/DRESSINGS) ×1 IMPLANT
BNDG ELASTIC 6X5.8 VLCR STR LF (GAUZE/BANDAGES/DRESSINGS) ×1 IMPLANT
BNDG GAUZE ELAST 4 BULKY (GAUZE/BANDAGES/DRESSINGS) ×2 IMPLANT
CANISTER SUCT 3000ML PPV (MISCELLANEOUS) ×2 IMPLANT
COVER WAND RF STERILE (DRAPES) ×2 IMPLANT
DRAPE EXTREMITY BILATERAL (DRAPES) IMPLANT
DRAPE HALF SHEET 40X57 (DRAPES) IMPLANT
DRAPE ISOLATION BAG 18X18 (DRAPES)
DRAPE U-SHAPE 76X120 STRL (DRAPES) IMPLANT
DRSG ADAPTIC 3X8 NADH LF (GAUZE/BANDAGES/DRESSINGS) ×3 IMPLANT
DRSG PAD ABDOMINAL 8X10 ST (GAUZE/BANDAGES/DRESSINGS) ×2 IMPLANT
ELECT REM PT RETURN 9FT ADLT (ELECTROSURGICAL) ×2
ELECTRODE REM PT RTRN 9FT ADLT (ELECTROSURGICAL) ×1 IMPLANT
GAUZE SPONGE 4X4 12PLY STRL (GAUZE/BANDAGES/DRESSINGS) ×2 IMPLANT
GAUZE XEROFORM 5X9 LF (GAUZE/BANDAGES/DRESSINGS) IMPLANT
GLOVE BIO SURGEON STRL SZ7.5 (GLOVE) ×2 IMPLANT
GOWN STRL REUS W/ TWL LRG LVL3 (GOWN DISPOSABLE) ×2 IMPLANT
GOWN STRL REUS W/ TWL XL LVL3 (GOWN DISPOSABLE) ×1 IMPLANT
GOWN STRL REUS W/TWL LRG LVL3 (GOWN DISPOSABLE) ×4
GOWN STRL REUS W/TWL XL LVL3 (GOWN DISPOSABLE) ×2
KIT BASIN OR (CUSTOM PROCEDURE TRAY) ×2 IMPLANT
KIT TURNOVER KIT B (KITS) ×2 IMPLANT
NS IRRIG 1000ML POUR BTL (IV SOLUTION) ×2 IMPLANT
PACK CV ACCESS (CUSTOM PROCEDURE TRAY) IMPLANT
PACK GENERAL/GYN (CUSTOM PROCEDURE TRAY) ×2 IMPLANT
PACK UNIVERSAL I (CUSTOM PROCEDURE TRAY) ×2 IMPLANT
PAD ARMBOARD 7.5X6 YLW CONV (MISCELLANEOUS) ×4 IMPLANT
POWDER SURGICEL 3.0 GRAM (HEMOSTASIS) ×1 IMPLANT
STAPLER VISISTAT 35W (STAPLE) ×1 IMPLANT
SUT ETHILON 3 0 PS 1 (SUTURE) IMPLANT
SUT VIC AB 2-0 CTX 36 (SUTURE) IMPLANT
SUT VIC AB 3-0 SH 27 (SUTURE) ×2
SUT VIC AB 3-0 SH 27X BRD (SUTURE) IMPLANT
SUT VICRYL 4-0 PS2 18IN ABS (SUTURE) IMPLANT
TOWEL GREEN STERILE (TOWEL DISPOSABLE) ×2 IMPLANT
WATER STERILE IRR 1000ML POUR (IV SOLUTION) ×2 IMPLANT

## 2020-09-18 NOTE — Progress Notes (Signed)
Reinforced dressing to left arm with abds and kerlix. Patient having a lot of pain with movement of left arm. Patient screams in pain with any touch to her right arm or any part of the body.   Will continue to monitor

## 2020-09-18 NOTE — Progress Notes (Signed)
Palliative-   Message left for Lawana Djimraou Mascari- patient's designated HCPOA.   Requested return call.   Mariana Kaufman, AGNP-C Palliative Medicine  No charge  Please call Palliative Medicine team phone with any questions [phone].

## 2020-09-18 NOTE — Progress Notes (Addendum)
Daily Progress Note   Patient Name: Madeline Burke       Date: 09/18/2020 DOB: 04-10-1934  Age: 87 y.o. MRN#: [id number] Attending Physician: Barb Merino, MD Primary Care Physician: Leeroy Cha, MD Admit Date: 09/10/2020  Reason for Consultation/Follow-up: Establishing goals of care  Subjective: Met with 2 of patient's daughters today Madeline Burke and Madeline Burke.  Madeline Burke was present briefly on speaker phone but had to leave shortly after our discussion again.  Madeline Burke described Madeline Burke as someone who values her independence, she is also known for her sense of humor and as a jokester. Her dementia has been worsening in the last year, she continues to be able to ambulate, she is continent, able to dress herself, however she has notable deficits in her memory that are getting worse. Her daughters have noticed a decline in her functional status and nutritional status over the last 6 months.  She has had a decreased appetite and decreased p.o. intake resulting in significant weight loss.  She has been living at home alone with her daughters visiting.  We discussed her current illnesses, comorbidities, and future treatment options. Her daughters are aware that Madeline Burke likely has a limited prognosis of lifetime. If healthcare decisions were completely left up to them they would wish for patient to transition to a DNR status, pursue continued Velcade treatment if offered, and would choose hospice and comfort measures only if it was determined that she would not be a candidate for further Velcade.  They are hopeful that with continued treatment with Velcade this will improve her kidney function, however they do understand due to the advanced stage of patient's multiple myeloma and her  multiple comorbidities she will continue to decline towards end-of-life.  Furthermore they are concerned about her suffering as she is recovering from the fasciotomy-they believe that she would choose to be comfort care and not have her suffering prolonged if she has significant pain and disability from her recent surgeries. A complicating factor in decision making for patient is the fact that the daughter who was not present for the extended discussion, Madeline Burke, is patient's designated healthcare power of attorney. Madeline Burke and Madeline Burke share that Madeline Burke has been somewhat absent in patient's care in decision making over the last few months, in fact in January when Madeline Burke was supposed  to be under the care of Madeline Burke-at a found marrying alone in her home on the floor where she had been for 6 to 7 hours. Madeline Burke has also expressed to her sisters that her decision would be for full code and continue to full scope treatment.  On chart review there is no copy of the [facility] POA present on patient's chart.  Review of Systems  Unable to perform ROS: Dementia    Length of Stay: 7  Current Medications: Scheduled Meds:  . sodium chloride   Intravenous Once  . acyclovir  200 mg Oral BID  . carvedilol  6.25 mg Oral BID WC  . cycloSPORINE  1 drop Both Eyes BID  . dexamethasone (DECADRON) injection  10 mg Intravenous Weekly  . feeding supplement  237 mL Oral TID BM  . fentaNYL      . insulin aspart  0-9 Units Subcutaneous TID WC  . multivitamin with minerals  1 tablet Oral Daily  . pantoprazole  40 mg Oral Daily  . rosuvastatin  10 mg Oral Daily  . sodium bicarbonate  650 mg Oral BID  . sodium chloride flush  3 mL Intravenous Q12H    Continuous Infusions: . sodium chloride 75 mL/hr at 09/18/20 1330  . acetaminophen    . vancomycin      PRN Meds: acetaminophen **OR** acetaminophen, EPINEPHrine, HYDROmorphone (DILAUDID) injection, ipratropium-albuterol, LORazepam, ondansetron **OR** ondansetron  (ZOFRAN) IV, oxyCODONE-acetaminophen, polyethylene glycol  Physical Exam Vitals and nursing note reviewed.             Vital Signs: BP (!) 185/87 (BP Location: Right Arm)   Pulse 78   Temp 98 F (36.7 C) (Oral)   Resp 16   Ht 5' 2" (1.575 m)   Wt 56.7 kg   SpO2 99%   BMI 22.86 kg/m  SpO2: SpO2: 99 % O2 Device: O2 Device: Room Air O2 Flow Rate: O2 Flow Rate (L/min): 6 L/min  Intake/output summary:   Intake/Output Summary (Last 24 hours) at 09/18/2020 1550 Last data filed at 09/18/2020 1330 Gross per 24 hour  Intake 1244 ml  Output 300 ml  Net 944 ml   LBM: Last BM Date: 09/13/20 Baseline Weight: Weight: 56.7 kg Most recent weight: Weight: 56.7 kg       Palliative Assessment/Data: PPS: 50%     Patient Active Problem List   Diagnosis Date Noted  . Multiple myeloma ([place]) 09/12/2020  . ARF (acute renal failure) ([place]) 09/11/2020  . AKI (acute kidney injury) ([place]) 09/10/2020  . Abdominal pain, epigastric 09/10/2020  . HTN (hypertension) 09/10/2020  . Diabetes ([place]) 09/10/2020  . Anemia 09/10/2020  . Angio-edema 02/08/2018    Palliative Care Assessment & Plan   Patient Profile: 87 y.o.femalewith past medical history of dementia, DM 2, HLD, hypertension, recent diagnosis of multiple myeloma with findings of plasma cell neoplasm on bone marrow biopsy-started on Velcade in this admission 3/26-admitted on 3/23/2022with complaint of abdominal pain and syncope.Shewas started on PPI.Further work-up revealed metabolic acidosis, acute kidney injury.Admission has been complicated due to developing a large hematoma from a brachial artery catheterization injury,developed compartment syndrome and required emergent fasciotomy. EEG completed shows diffuse cerebral dysfunction.Palliative medicine consulted for "goals of care,high risk 46-monthmortality".   Assessment/Recommendations/Plan  Complicated family dynamics-there is a daughter who is a dHotel manager  however she frequently disengages from patient's care I have left 2 messages for Madeline Burke today with the intent to schedule a time to meet specifically with her and to discuss with  her the importance of her HCPOA role in decision making and to request copies of the [facility] POA paperwork If Madeline Burke is not able to provide the [facility] paperwork and is not receptive to care planning discussions-then she is being neglectful of her HCPOA role and decision making will be deferred to her sisters- Madeline Burke and Madeline Burke Recommend all care providers document attempts to reach HCPOA and outcomes of those attempts and discussions  Goals of Care and Additional Recommendations: Limitations on Scope of Treatment: Full Scope Treatment  Code Status: Full code  Prognosis:  < 12 months  Care plan was discussed with patient's daughters- Madeline Burke and Madeline Burke  Thank you for allowing the Palliative Medicine Team to assist in the care of this patient.   Total time: 93 minutes  Greater than 50%  of this time was spent counseling and coordinating care related to the above assessment and plan.  Mariana Kaufman, AGNP-C Palliative Medicine   Please contact Palliative Medicine Team phone at [phone] for questions and concerns.

## 2020-09-18 NOTE — Progress Notes (Signed)
PROGRESS NOTE    Madeline Burke  LGX:[id number] DOB: Mar 12, 1934 DOA: 09/10/2020 PCP: Leeroy Cha, MD    Chief Complaint  Patient presents with  . Abdominal Pain    Brief Narrative:  87 year old with prior h/o hypertension, hyperlipidemia, DM, angioedema, recently diagnosed multiple myeloma presented to the [facility] long hospital with abdominal pain.   She was found to have acute kidney injury, metabolic acidosis, creatinine worsened from baseline creatinine of 1 -2.5.  Patient had confusion and a blood gas was drawn from the left brachial artery that resulted in arterial laceration and compartment syndrome and was transferred to [facility] with vascular surgery.   Patient underwent exploration of the left brachial artery and  Fasciotomy with wound VAC placement on 3/28. Remains in the hospital with multiple issues, worsening renal functions and mental status. Patient also received a dose of dexamethasone and Velcade with oncology on 3/25.  Next dose held secondary to vascular complication.  Assessment & Plan:   Principal Problem:   AKI (acute kidney injury) ([place]) Active Problems:   Abdominal pain, epigastric   HTN (hypertension)   Diabetes ([place])   Anemia   ARF (acute renal failure) (H[facility])   Left forearm compartment syndrome: Left upper extremity hematoma, due to left brachial artery injury with compartment syndrome secondary to arterial injury while doing arterial puncture.   Vascular surgery consulted and she underwent exploration of the left brachial artery and placement of VAC dressing on 3/28.  Adequate pain control.  Patient is still with severe pain, added injectable Dilaudid for pain control and to help with mobility and dressing change. Scheduled for wound closure today. Continue mobility, distal neurovascular status intact.  AKI with hyperkalemia and metabolic acidosis:  Secondary to multiple myeloma.  Urine output is poor.  300 M were recorded. Renal  functions continues to worsen.  Recent normal renal functions.  Now with worsening kidney functions and multiple issues, unsure about recovery. Followed by nephrology.  Unlikely dialysis candidate, today started on IV fluids.  Acute anemia of blood loss from left upper extremity hematoma and surgical exploration of the left brachial artery.  Anemia of chronic disease and bone marrow involvement. S/p 3 units of prbc transfusion and 2 units of FFP 3/28 2 units of PRBC on 3/30.  Hemoglobin 10 today.    Leukocytosis, reactive.  Thrombocytopenia:  Platelets on admission 148,000 dropping now.  Platelets 51.  No evidence of DIC.  Syncope:  Initially suspected possible vasovagal or neuro cardiogenic in the setting of severe pain. Initial CT head and MRI brain negative for acute infarct.  Probably due to profound debility.  Multiple Myeloma light chain disease:  S/p first dose of Velcade and dexamethasone on Friday 09/12/20.  Not sure patient is a treatment candidate, start palliative conversation.  Oncology following.  Starting palliative conversation.  Hypertension: Well controlled.  On Coreg with parameters.    Type 2 Diabetes Mellitus:  CBG (last 3)  Recent Labs    09/18/20 0625 09/18/20 1044 09/18/20 1328  GLUCAP 115* 126* 145*   Resumed SSI.  Fairly stable.  Hyperlipidemia:  Continue with crestor.    DVT prophylaxis: Scd's Code Status: fULL CODE.  Family Communication: Patient's daughter at the bedside. Disposition:   Status is: Inpatient  Remains inpatient appropriate because:Ongoing diagnostic testing needed not appropriate for outpatient work up and IV treatments appropriate due to intensity of illness or inability to take PO   Dispo: The patient is from: Home  Anticipated d/c is to: pending              Patient currently is not medically stable to d/c.   Difficult to place patient No       Consultants:   Vascular surgery  Surgery.    Nephrology  Palliative medicine  Procedures:  Brachial artery exploration/ repair , placement of VAC dressing.  Left arm wound closure 3/31.  Antimicrobials:  Antibiotics Given (last 72 hours)    Date/Time Action Medication Dose   09/15/20 2204 Given   [MAR Hold] acyclovir (ZOVIRAX) 200 MG capsule 200 mg (MAR Hold since Thu 09/18/2020 at 1105.Hold Reason: Transfer to a Procedural area.) 200 mg   09/16/20 0852 Given   [MAR Hold] acyclovir (ZOVIRAX) 200 MG capsule 200 mg (MAR Hold since Thu 09/18/2020 at 1105.Hold Reason: Transfer to a Procedural area.) 200 mg   09/16/20 2208 Given   [MAR Hold] acyclovir (ZOVIRAX) 200 MG capsule 200 mg (MAR Hold since Thu 09/18/2020 at 1105.Hold Reason: Transfer to a Procedural area.) 200 mg   09/17/20 0943 Given   [MAR Hold] acyclovir (ZOVIRAX) 200 MG capsule 200 mg (MAR Hold since Thu 09/18/2020 at 1105.Hold Reason: Transfer to a Procedural area.) 200 mg   09/17/20 2308 Given   [MAR Hold] acyclovir (ZOVIRAX) 200 MG capsule 200 mg (MAR Hold since Thu 09/18/2020 at 1105.Hold Reason: Transfer to a Procedural area.) 200 mg      Subjective: Patient seen and examined in the morning rounds.  She was talkative, hungry.  Her daughter was at the bedside.  Patient stated much pain on mobility on the left arm.  Later today, she underwent closure of the wound.  Objective: Vitals:   09/18/20 1110 09/18/20 1330 09/18/20 1345 09/18/20 1400  BP:  (!) 159/79 (!) 177/86 (!) 187/83  Pulse:  65 67 71  Resp:  13 15 13   Temp:  99.1 F (37.3 C)    TempSrc:      SpO2:  100% 100% 97%  Weight: 56.7 kg     Height: 5' 2"  (1.575 m)       Intake/Output Summary (Last 24 hours) at 09/18/2020 1446 Last data filed at 09/18/2020 1330 Gross per 24 hour  Intake 1364 ml  Output 300 ml  Net 1064 ml   Filed Weights   09/10/20 1829 09/18/20 1110  Weight: 56.7 kg 56.7 kg    Examination:  General exam: Chronically sick looking.  Not in any distress. Patient is alert awake  and oriented x2. Respiratory system: Clear to auscultation. Respiratory effort normal. Cardiovascular system: S1 & S2 heard, RRR. No JVD,  No pedal edema. Gastrointestinal system: Abdomen is nondistended, soft and nontender. Normal bowel sounds heard. Central nervous system: Alert and oriented x2-3.  Moves all extremities. Extremities: left  upper extremity swelling, bandaged.  Distal neurovascular status intact. Skin: No rashes Psychiatry:  Mood & affect anxious.    Data Reviewed: I have personally reviewed following labs and imaging studies  CBC: Recent Labs  Lab 09/13/20 2325 09/14/20 0516 09/15/20 0443 09/15/20 1027 09/15/20 1229 09/16/20 0530 09/17/20 1033 09/18/20 0251  WBC 8.4   < > 18.8* 21.7*  --  13.2* 13.7* 7.9  NEUTROABS 5.0  --   --   --   --   --  2.7  --   HGB 7.0*   < > 5.4* 5.6* 5.1* 9.4* 5.2* 10.1*  HCT 22.1*   < > 16.5* 17.8* 15.0* 26.9* 15.4* 28.9*  MCV 88.8   < >  87.8 90.4  --  84.9 89.0 88.1  PLT 110*   < > 101* 101*  --  66* 63* 51*   < > = values in this interval not displayed.    Basic Metabolic Panel: Recent Labs  Lab 09/15/20 0443 09/15/20 1027 09/15/20 1229 09/16/20 0530 09/17/20 1033 09/18/20 0251  NA 140 142 143 139 137 137  K 4.9 5.4* 5.1 5.4* 4.9 4.6  CL 114* 113* 113* 112* 109 108  CO2 20* 20*  --  18* 21* 21*  GLUCOSE 218* 224* 198* 200* 172* 107*  BUN 32* 31* 33* 34* 46* 48*  CREATININE 1.88* 2.13* 2.30* 2.33* 3.53* 3.81*  CALCIUM 7.6* 7.7*  --  7.1* 6.7* 6.5*  MG 1.8  --   --   --   --   --     GFR: Estimated Creatinine Clearance: 8.5 mL/min (A) (by C-G formula based on SCr of 3.81 mg/dL (H)).  Liver Function Tests: Recent Labs  Lab 09/13/20 0619 09/13/20 2325 09/14/20 0516 09/15/20 0443 09/17/20 1033  AST 24 91* 91* 59* 22  ALT 13 38 33 32 18  ALKPHOS 46 52 55 56 44  BILITOT 0.4 0.6 0.6 0.4 0.7  PROT 6.2* 6.4* 6.4* 5.6* 4.9*  ALBUMIN 3.6 3.8 3.7 3.1* 2.5*    CBG: Recent Labs  Lab 09/17/20 1631  09/17/20 2138 09/18/20 0625 09/18/20 1044 09/18/20 1328  GLUCAP 104* 128* 115* 126* 145*     Recent Results (from the past 240 hour(s))  SARS CORONAVIRUS 2 (TAT 6-24 HRS) Nasopharyngeal Nasopharyngeal Swab     Status: None   Collection Time: 09/10/20 10:42 PM   Specimen: Nasopharyngeal Swab  Result Value Ref Range Status   SARS Coronavirus 2 NEGATIVE NEGATIVE Final    Comment: (NOTE) SARS-CoV-2 target nucleic acids are NOT DETECTED.  The SARS-CoV-2 RNA is generally detectable in upper and lower respiratory specimens during the acute phase of infection. Negative results do not preclude SARS-CoV-2 infection, do not rule out co-infections with other pathogens, and should not be used as the sole basis for treatment or other patient management decisions. Negative results must be combined with clinical observations, patient history, and epidemiological information. The expected result is Negative.  Fact Sheet for Patients: SugarRoll.be  Fact Sheet for Healthcare Providers: https://www.woods-mathews.com/  This test is not yet approved or cleared by the [place] FDA and  has been authorized for detection and/or diagnosis of SARS-CoV-2 by FDA under an Emergency Use Authorization (EUA). This EUA will remain  in effect (meaning this test can be used) for the duration of the COVID-19 declaration under Se ction 564(b)(1) of the Act, 21 U.S.C. section 360bbb-3(b)(1), unless the authorization is terminated or revoked sooner.  Performed at [facility], [place] [address], [place], [place] [address]          Radiology Studies: No results found.      Scheduled Meds: . [MAR Hold] sodium chloride   Intravenous Once  . [MAR Hold] acyclovir  200 mg Oral BID  . [MAR Hold] carvedilol  6.25 mg Oral BID WC  . [MAR Hold] cycloSPORINE  1 drop Both Eyes BID  . [MAR Hold] dexamethasone (DECADRON) injection  10 mg Intravenous Weekly  .  feeding supplement  237 mL Oral TID BM  . fentaNYL      . [MAR Hold] insulin aspart  0-9 Units Subcutaneous TID WC  . [MAR Hold] multivitamin with minerals  1 tablet Oral Daily  . [facility] Hold]  pantoprazole  40 mg Oral Daily  . [MAR Hold] rosuvastatin  10 mg Oral Daily  . [MAR Hold] sodium bicarbonate  650 mg Oral BID  . [MAR Hold] sodium chloride flush  3 mL Intravenous Q12H   Continuous Infusions: . sodium chloride 75 mL/hr at 09/18/20 1330  . acetaminophen    . acetaminophen 1,000 mg (09/18/20 1342)  . [MAR Hold] vancomycin       LOS: 7 days    Time spent: 32 minutes    Barb Merino, MD [facility]   09/18/2020, 2:46 PM

## 2020-09-18 NOTE — Plan of Care (Signed)
  Problem: Clinical Measurements: Goal: Respiratory complications will improve Outcome: Progressing   Problem: Health Behavior/Discharge Planning: Goal: Ability to manage health-related needs will improve Outcome: Not Progressing   Problem: Activity: Goal: Risk for activity intolerance will decrease Outcome: Not Progressing

## 2020-09-18 NOTE — H&P (View-Only) (Signed)
[facility]  Subjective  - doesn't want to move left arm   Objective (!) 143/69 69 98 F (36.7 C) (Oral) 20 99%  Intake/Output Summary (Last 24 hours) at 09/18/2020 1100 Last data filed at 09/18/2020 0451 Gross per 24 hour  Intake 1904 ml  Output --  Net 1904 ml   Neuro no movement left arm difficult to know if this is pain or cognition issue  Some serosanguinous drainage on dressing  Assessment/Planning: POD #3 s/p left arm fasciotomy and brachial artery repair  Thrombocytopenia persists which contributes to ongoing oozing  To OR today for washout and possible closure  Long term return of arm function currently limited by cognition problems and pain    Ruta Hinds 09/18/2020 8:39 AM --  Laboratory Lab Results: Recent Labs    09/17/20 1033 09/18/20 0251  WBC 13.7* 7.9  HGB 5.2* 10.1*  HCT 15.4* 28.9*  PLT 63* 51*   BMET Recent Labs    09/17/20 1033 09/18/20 0251  NA 137 137  K 4.9 4.6  CL 109 108  CO2 21* 21*  GLUCOSE 172* 107*  BUN 46* 48*  CREATININE 3.53* 3.81*  CALCIUM 6.7* 6.5*    COAG Lab Results  Component Value Date   INR 1.4 (H) 09/16/2020   INR 1.4 (H) 09/15/2020   No results found for: PTT

## 2020-09-18 NOTE — Op Note (Signed)
    Patient name: Madeline Burke MRN: [id number] DOB: 21-Oct-1933 Sex: female  09/18/2020 Pre-operative Diagnosis: left arm wound Post-operative diagnosis:  Same Surgeon:  Erlene Quan C. Donzetta Matters, MD Assistant: Ethelene Hal, MS3 Procedure Performed:  Washout and closure left upper extremity wounds  Indications: 87 year old female with history of brachial artery injury that was repaired along with fasciotomies of her left upper extremity 3 days ago.  She is now indicated for wound washout and possible closure.  Findings: There was significant oozing and all of her upper extremity wounds.  I removed her previous staples.  We obtain hemostasis to the best of the our abilities and all the wounds and they were all closed primarily with staples of the level of the skin.  She did have radial and ulnar signals at completion that were similar to the beginning of the case.   Procedure:  The patient was identified in the holding area and taken to the operating room where she is placed supine on the operative.  She underwent LMA anesthesia.  Left upper extremity was sterilely prepped and draped antibiotics were minister timeout was called.  I began by removing the existing staples in the medial upper arm given that there was oozing from under there.  I thoroughly irrigated the wound obtain hemostasis with cautery.  I did use Doppler I checked the brachial artery signal which was triphasic.  There were also signals at the ulnar and radial arteries at the wrist that were strong.  I used Surgicel powder in the entirety of the upper extremity wound and placed a dried lap pad.  I turned my attention towards the posterior distal arm.  I remove the staples that were holding that there.  It appeared the skin could be reapproximated there.  I thoroughly irrigated this wound.  I obtain hemostasis and closed primarily with staples.  I really focus my attention on the medial part of the arm.  Hemostasis appeared to be satisfactory  although there was some oozing from the soft tissue.  I closed some of the soft tissue down with interrupted 2-0 Vicryl suture.  I then stapled the entirety of the wound.  There was some tension distally in the wound but the skin did easily reapproximated.  I then placed nonadherent gauze followed by ABD pads and wrapped the arm with Kerlix 4 inch distally and 6 inch proximally.  She was then awakened from anesthesia having tolerated procedure well without any complication.  All counts were correct at completion.  EBL: 50 cc   Champ Keetch C. Donzetta Matters, MD [facility] [place] Office: [phone] Pager: [phone]

## 2020-09-18 NOTE — Anesthesia Procedure Notes (Signed)
Procedure Name: LMA Insertion Date/Time: 09/18/2020 12:24 PM Performed by: Hoy Morn, CRNA Pre-anesthesia Checklist: Patient identified, Emergency Drugs available, Suction available and Patient being monitored Patient Re-evaluated:Patient Re-evaluated prior to induction Oxygen Delivery Method: Circle system utilized Preoxygenation: Pre-oxygenation with 100% oxygen Induction Type: IV induction LMA: LMA inserted LMA Size: 4.0 Placement Confirmation: positive ETCO2 and breath sounds checked- equal and bilateral Tube secured with: Tape Dental Injury: Teeth and Oropharynx as per pre-operative assessment

## 2020-09-18 NOTE — Transfer of Care (Signed)
Immediate Anesthesia Transfer of Care Note  Patient: Madeline Burke  Procedure(s) Performed: LEFT ARM WASHOUT (Left Arm Upper) LEFT ARM FASCIOTOMY CLOSURE (Left Arm Upper)  Patient Location: PACU  Anesthesia Type:General  Level of Consciousness: awake  Airway & Oxygen Therapy: Patient Spontanous Breathing and Patient connected to face mask oxygen  Post-op Assessment: Report given to RN and Post -op Vital signs reviewed and stable  Post vital signs: Reviewed and stable  Last Vitals:  Vitals Value Taken Time  BP 159/79 09/18/20 1327  Temp    Pulse 65 09/18/20 1329  Resp 13 09/18/20 1329  SpO2 100 % 09/18/20 1329  Vitals shown include unvalidated device data.  Last Pain:  Vitals:   09/18/20 1110  TempSrc:   PainSc: 0-No pain      Patients Stated Pain Goal: 1 (54/00/86 7619)  Complications: No complications documented.

## 2020-09-18 NOTE — Progress Notes (Signed)
Patient returned from PACU. Faschiotomies were closed in OR. Patient is alert and c/o pain. PRN dilaudid administered. Dressing to left arm in clean dry and intact. No drainage noted. VS are stable. Daughters at bedside. Fuller Canada, RN

## 2020-09-18 NOTE — Care Management Important Message (Signed)
Important Message  Patient Details  Name: Madeline Burke MRN: [id number] Date of Birth: Jan 16, 1934   Medicare Important Message Given:  Yes     Shelda Altes 09/18/2020, 9:17 AM

## 2020-09-18 NOTE — Anesthesia Preprocedure Evaluation (Signed)
Anesthesia Evaluation  Patient identified by MRN, date of birth, ID band Patient awake    Reviewed: Allergy & Precautions, NPO status , Patient's Chart, lab work & pertinent test results  Airway Mallampati: III  TM Distance: >3 FB Neck ROM: Full    Dental  (+) Edentulous Upper, Edentulous Lower   Pulmonary neg pulmonary ROS,    Pulmonary exam normal breath sounds clear to auscultation       Cardiovascular hypertension, Normal cardiovascular exam Rhythm:Regular Rate:Normal  ECG: NSR, rate 77. LAD   Neuro/Psych Dementia negative neurological ROS     GI/Hepatic negative GI ROS, Neg liver ROS,   Endo/Other  diabetes  Renal/GU Renal disease     Musculoskeletal Multiple myeloma   Abdominal   Peds  Hematology  (+) Blood dyscrasia, anemia , Thrombocytopenia    Anesthesia Other Findings Acute Renal Failure  Reproductive/Obstetrics                             Anesthesia Physical Anesthesia Plan  ASA: III  Anesthesia Plan: General   Post-op Pain Management:    Induction: Intravenous  PONV Risk Score and Plan: 3 and Ondansetron, Dexamethasone and Treatment may vary due to age or medical condition  Airway Management Planned: LMA  Additional Equipment:   Intra-op Plan:   Post-operative Plan: Extubation in OR  Informed Consent: I have reviewed the patients History and Physical, chart, labs and discussed the procedure including the risks, benefits and alternatives for the proposed anesthesia with the patient or authorized representative who has indicated his/her understanding and acceptance.     Consent reviewed with POA  Plan Discussed with: CRNA  Anesthesia Plan Comments:         Anesthesia Quick Evaluation

## 2020-09-18 NOTE — Progress Notes (Addendum)
Nutrition Follow-up  DOCUMENTATION CODES:   Not applicable  INTERVENTION:   No BM x 6 days- recommend scheduled bowel regimen  Liberalize diet to REGULAR   Ensure Enlive po TID, each supplement provides 350 kcal and 20 grams of protein  MVI daily  NUTRITION DIAGNOSIS:   Increased nutrient needs related to acute illness as evidenced by estimated needs   Ongoing  GOAL:   Patient will meet greater than or equal to 90% of their needs   Progressing   MONITOR:   PO intake,Supplement acceptance,Labs,Weight trends  REASON FOR ASSESSMENT:   Consult Assessment of nutrition requirement/status  ASSESSMENT:   87 y.o. female with medical history of HTN, HLD, DM, and elevated immunoglobulin levels with concern for multiple myeloma. Presents this admission with AKI and abdominal pain from unknown etiology.   3/28- continued swelling after L brachial blood gas, tx to [facility], s/p fasciotomy L forearm and brachial artery repair  In OR for washout and possible closure upon RD follow up.   Last five meal documented as 50%, 10%, 50%, 50%, and 40%. Patient taking Prosource BID and takes Glucerna 1-2 times daily. Liberalize diet to regular given increased nutrient needs and advanced age.   Change to more nutrient dense supplement such as Ensure as it provides 350 kcals, 20 grams protein vs Glucerna which only provides 220 kcals, 10 grams of protein. Given pt's hx of DM and current steroid regimen may need to adjust insulin.   Suspect patient is severely malnourished but unable to perform NFPE at this time. Will attempt at next follow up if able.   Admission weight: 56.7 kg (no recent weight)  Drips: NS @ 75 ml/hr  Medications: decadron, SS novolog Labs: CBG 115-155  Diet Order:   Diet Order            Diet NPO time specified Except for: Sips with Meds  Diet effective midnight                 EDUCATION NEEDS:   Not appropriate for education at this time  Skin:  Skin  Assessment: Skin Integrity Issues: Skin Integrity Issues:: Incisions Incisions: L arm  Last BM:  3/26  Height:   Ht Readings from Last 1 Encounters:  09/18/20 _0  (1.575 m)    Weight:   Wt Readings from Last 1 Encounters:  09/18/20 56.7 kg    Estimated Nutritional Needs:   Kcal:  1500-1700 kcal   Protein:  70-80 grams   Fluid:  >/= 1.6 L/day  Mariana Single RD, LDN Clinical Nutrition Pager listed in [place]

## 2020-09-18 NOTE — Interval H&P Note (Signed)
History and Physical Interval Note:  09/18/2020 11:21 AM  Madeline Burke  has presented today for surgery, with the diagnosis of Acute Renal Failure.  The various methods of treatment have been discussed with the patient and family. After consideration of risks, benefits and other options for treatment, the patient has consented to  Procedure(s): LEFT ARM WASHOUT (Left) LEFT ARM FASCIOTOMY CLOSURE (Left) as a surgical intervention.  The patient's history has been reviewed, patient examined, no change in status, stable for surgery.  I have reviewed the patient's chart and labs.  Questions were answered to the patient's satisfaction.     Servando Snare

## 2020-09-18 NOTE — Progress Notes (Addendum)
Palliative-  Additional non face-to-face encounter-  Madeline Burke returned my call. She confirmed that she is the [employer], with [place] being back up.  Madeline Burke shared that she has a deep relationship with her Mom- feeling as though her Mom is her best friend, sister, confidante. She shares that her Mom has always been a provider for the family, a strong black woman who raised her daughters with care and love.  Her mom was frequently called on by others who needed care. Madeline Burke and her mom participated in [place] to others together.  Madeline Burke is carrying a burden of guilt- she worries that she is the one who brought her Mom to the hospital and that her Mom may not leave and is now suffering more because of her decision to bring her to the hospital. She shared concerns that her mom is nearing end-of-life, noting that her mom has been talking about her new healing that will happen after she dies. However, Madeline Burke also speaks to her belief in physical healing on earth by God.  Long discussion was had with Madeline Burke addressing code status, goals of care and possible illness trajectories. I reviewed the conversation that I had with Madeline Burke and Madeline Burke. Madeline Burke is in agreement with the previous conversation that was had with her sisters. She agrees to DNR status. She remains hopeful that her Mom will be able to receive further Velcade treatments for her multiple myeloma- however, she understands that she may not and this will ultimately lead to her end of life. In this case- then she would wish for comfort measures only and Hospice care.  If her mother looked to be declining during this hospitalization- then she would wish for transfer out of the hospital into Hospice care- would not want her to die in the hospital. I also called Madeline Burke and updated her on my conversation with Madeline Burke.   -DNR -PMT will continue to follow and update daughters and discuss GOC PRN -Recommend outpatient Palliative at discharge  for continued followup  Madeline Burke, AGNP-C Palliative Medicine  Please call Palliative Medicine team phone with any questions [phone].  Total time: 62 minutes

## 2020-09-18 NOTE — Progress Notes (Signed)
[facility] Progress Note   Subjective:   Lots of arm pain.  Plans for OR closure today with Dr. Oneida Alar.  Ins yesterday 1.6L, No UOP documented - has urine in purewick can ~357m. Cr 3.8 from 3.5.  Hb 5.2 > 10.1 now.  Daughter bedside  Objective Vitals:   09/17/20 2049 09/17/20 2126 09/17/20 2354 09/18/20 0827  BP: 135/65 131/69 (!) 114/52 (!) 143/69  Pulse: 79 78 72 69  Resp: 18 19 19 20   Temp: 97.6 F (36.4 C) 98.6 F (37 C) 98.2 F (36.8 C) 98 F (36.7 C)  TempSrc: Oral Axillary Axillary Oral  SpO2:  97% 97% 99%  Weight:      Height:       Physical Exam General: lying in bed comfortably currently Heart: RRR< no rub Lungs: clear, normal WOB Abdomen: soft Extremities: LUE dressing intact; no LE edema ENT: MMM  Additional Objective Labs: Basic Metabolic Panel: Recent Labs  Lab 09/16/20 0530 09/17/20 1033 09/18/20 0251  NA 139 137 137  K 5.4* 4.9 4.6  CL 112* 109 108  CO2 18* 21* 21*  GLUCOSE 200* 172* 107*  BUN 34* 46* 48*  CREATININE 2.33* 3.53* 3.81*  CALCIUM 7.1* 6.7* 6.5*   Liver Function Tests: Recent Labs  Lab 09/14/20 0516 09/15/20 0443 09/17/20 1033  AST 91* 59* 22  ALT 33 32 18  ALKPHOS 55 56 44  BILITOT 0.6 0.4 0.7  PROT 6.4* 5.6* 4.9*  ALBUMIN 3.7 3.1* 2.5*   No results for input(s): LIPASE, AMYLASE in the last 168 hours. CBC: Recent Labs  Lab 09/13/20 2325 09/14/20 0516 09/15/20 0443 09/15/20 1027 09/15/20 1229 09/16/20 0530 09/17/20 1033 09/18/20 0251  WBC 8.4   < > 18.8* 21.7*  --  13.2* 13.7* 7.9  NEUTROABS 5.0  --   --   --   --   --  2.7  --   HGB 7.0*   < > 5.4* 5.6*   < > 9.4* 5.2* 10.1*  HCT 22.1*   < > 16.5* 17.8*   < > 26.9* 15.4* 28.9*  MCV 88.8   < > 87.8 90.4  --  84.9 89.0 88.1  PLT 110*   < > 101* 101*  --  66* 63* 51*   < > = values in this interval not displayed.   Blood Culture No results found for: SDES, SPECREQUEST, CULT, REPTSTATUS  Cardiac Enzymes: No results for input(s): CKTOTAL,  CKMB, CKMBINDEX, TROPONINI in the last 168 hours. CBG: Recent Labs  Lab 09/17/20 0615 09/17/20 1151 09/17/20 1631 09/17/20 2138 09/18/20 0625  GLUCAP 107* 155* 104* 128* 115*   Iron Studies: No results for input(s): IRON, TIBC, TRANSFERRIN, FERRITIN in the last 72 hours. @lablastinr3 @ Studies/Results: VAS UK[place]UPPER EXTREMITY VENOUS DUPLEX  Result Date: 09/16/2020 UPPER VENOUS STUDY  Indications: Pain, and Swelling Limitations: Body habitus and patient cooperation. Performing Technologist: CAntonieta PertRDMS, RVT  Examination Guidelines: A complete evaluation includes B-mode imaging, spectral Doppler, color Doppler, and power Doppler as needed of all accessible portions of each vessel. Bilateral testing is considered an integral part of a complete examination. Limited examinations for reoccurring indications may be performed as noted.  Right Findings: +----------+------------+---------+-----------+----------+--------------------+ RIGHT     CompressiblePhasicitySpontaneousProperties      Summary        +----------+------------+---------+-----------+----------+--------------------+ IJV           Full       Yes       Yes                                   +----------+------------+---------+-----------+----------+--------------------+  Subclavian               Yes       Yes              distal iv cath noted +----------+------------+---------+-----------+----------+--------------------+ Axillary      Full       Yes       Yes                                   +----------+------------+---------+-----------+----------+--------------------+ Brachial      Full                                                       +----------+------------+---------+-----------+----------+--------------------+ Radial        Full                                                       +----------+------------+---------+-----------+----------+--------------------+ Ulnar                                                   Not visualized    +----------+------------+---------+-----------+----------+--------------------+ Cephalic      Full                                                       +----------+------------+---------+-----------+----------+--------------------+ Basilic                                                Not visualized    +----------+------------+---------+-----------+----------+--------------------+ intersitial fluid noted in right arm  Left Findings: +----------+------------+---------+-----------+----------+-------+ LEFT      CompressiblePhasicitySpontaneousPropertiesSummary +----------+------------+---------+-----------+----------+-------+ Subclavian               Yes       Yes                      +----------+------------+---------+-----------+----------+-------+  Summary:  Right: No evidence of deep vein thrombosis in the upper extremity. No evidence of superficial vein thrombosis in the upper extremity.  Left: No evidence of thrombosis in the subclavian.  *See table(s) above for measurements and observations.  Diagnosing physician: Ruta Hinds MD Electronically signed by Ruta Hinds MD on 09/16/2020 at 3:07:46 PM.    Final    Medications: . vancomycin     . (feeding supplement) PROSource Plus  30 mL Oral BID BM  . sodium chloride   Intravenous Once  . acyclovir  200 mg Oral BID  . carvedilol  6.25 mg Oral BID WC  . cycloSPORINE  1 drop Both Eyes BID  . dexamethasone (DECADRON) injection  10 mg Intravenous Weekly  . feeding supplement (GLUCERNA SHAKE)  237 mL Oral Q24H  . insulin  aspart  0-9 Units Subcutaneous TID WC  . multivitamin with minerals  1 tablet Oral Daily  . pantoprazole  40 mg Oral Daily  . rosuvastatin  10 mg Oral Daily  . sodium bicarbonate  650 mg Oral BID  . 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  I suspect this is related to hemodynamic insults with modest hypotension, ABLA.  She's currently NPO and with her pain and mental status I don't think po intake was much so will give MIVF today NS 75/hr. At this point she doesn't have indications for dialysis and hopefully she won't develop. I discussed this with daughter yesterday and again today with the other daughter- she wouldn't be a good long term HD candidate and I wouldn't recommend doing it in light of age and comorbids.  Daughter agrees.  Pall care also seeing today to further discuss [place].  Continue supportive care for now hoping for renal recovery. Avoid further insults including hypotension, hypovolemia, nephrotoxins.  Follow I/Os.   **Multiple myeloma: heme/onc has initiated treatment with velcade and decadron - 1st cycle last week after recent diagnosis.    **LUE compartment syndrome: s/p evacuation and fasciotomy; wound vac in place.  Limited use of L hand currently. VVS following and going to OR today.   **Anemia: multifactorial with a component of ABLA.  Transfusion per primary.  Hematology following for myeloma.   **HTN:  Baseline HTN on coreg 25 BID, amlodipine 2.5 daily at home; amlodipine held and coreg reduced.   Put hold parameter for coreg to hold if < 110.   **Hyperkalemia: being managed with lokelma PRN.     Will follow closely, page with questions.   Jannifer Hick MD 09/18/2020, 9:04 AM  [facility] Pager: [phone]

## 2020-09-18 NOTE — Progress Notes (Signed)
[facility]  Subjective  - doesn't want to move left arm   Objective (!) 143/69 69 98 F (36.7 C) (Oral) 20 99%  Intake/Output Summary (Last 24 hours) at 09/18/2020 8882 Last data filed at 09/18/2020 0451 Gross per 24 hour  Intake 1904 ml  Output --  Net 1904 ml   Neuro no movement left arm difficult to know if this is pain or cognition issue  Some serosanguinous drainage on dressing  Assessment/Planning: POD #3 s/p left arm fasciotomy and brachial artery repair  Thrombocytopenia persists which contributes to ongoing oozing  To OR today for washout and possible closure  Long term return of arm function currently limited by cognition problems and pain    Ruta Hinds 09/18/2020 8:39 AM --  Laboratory Lab Results: Recent Labs    09/17/20 1033 09/18/20 0251  WBC 13.7* 7.9  HGB 5.2* 10.1*  HCT 15.4* 28.9*  PLT 63* 51*   BMET Recent Labs    09/17/20 1033 09/18/20 0251  NA 137 137  K 4.9 4.6  CL 109 108  CO2 21* 21*  GLUCOSE 172* 107*  BUN 46* 48*  CREATININE 3.53* 3.81*  CALCIUM 6.7* 6.5*    COAG Lab Results  Component Value Date   INR 1.4 (H) 09/16/2020   INR 1.4 (H) 09/15/2020   No results found for: PTT

## 2020-09-19 ENCOUNTER — Encounter (HOSPITAL_COMMUNITY): Payer: Self-pay | Admitting: Vascular Surgery

## 2020-09-19 DIAGNOSIS — Z515 Encounter for palliative care: Secondary | ICD-10-CM | POA: Diagnosis not present

## 2020-09-19 DIAGNOSIS — Z7189 Other specified counseling: Secondary | ICD-10-CM | POA: Diagnosis not present

## 2020-09-19 DIAGNOSIS — N179 Acute kidney failure, unspecified: Secondary | ICD-10-CM | POA: Diagnosis not present

## 2020-09-19 DIAGNOSIS — C9000 Multiple myeloma not having achieved remission: Secondary | ICD-10-CM | POA: Diagnosis not present

## 2020-09-19 LAB — CBC
HCT: 25 % — ABNORMAL LOW (ref 36.0–46.0)
Hemoglobin: 8.5 g/dL — ABNORMAL LOW (ref 12.0–15.0)
MCH: 30.6 pg (ref 26.0–34.0)
MCHC: 34 g/dL (ref 30.0–36.0)
MCV: 89.9 fL (ref 80.0–100.0)
Platelets: 67 10*3/uL — ABNORMAL LOW (ref 150–400)
RBC: 2.78 MIL/uL — ABNORMAL LOW (ref 3.87–5.11)
RDW: 16.7 % — ABNORMAL HIGH (ref 11.5–15.5)
WBC: 11.9 10*3/uL — ABNORMAL HIGH (ref 4.0–10.5)
nRBC: 1.7 % — ABNORMAL HIGH (ref 0.0–0.2)

## 2020-09-19 LAB — RENAL FUNCTION PANEL
Albumin: 2.2 g/dL — ABNORMAL LOW (ref 3.5–5.0)
Anion gap: 11 (ref 5–15)
BUN: 47 mg/dL — ABNORMAL HIGH (ref 8–23)
CO2: 19 mmol/L — ABNORMAL LOW (ref 22–32)
Calcium: 6.5 mg/dL — ABNORMAL LOW (ref 8.9–10.3)
Chloride: 109 mmol/L (ref 98–111)
Creatinine, Ser: 4.09 mg/dL — ABNORMAL HIGH (ref 0.44–1.00)
GFR, Estimated: 10 mL/min — ABNORMAL LOW (ref >60)
Glucose, Bld: 119 mg/dL — ABNORMAL HIGH (ref 70–99)
Phosphorus: 6.8 mg/dL — ABNORMAL HIGH (ref 2.5–4.6)
Potassium: 5.1 mmol/L (ref 3.5–5.1)
Sodium: 139 mmol/L (ref 135–145)

## 2020-09-19 LAB — GLUCOSE, CAPILLARY
Glucose-Capillary: 124 mg/dL — ABNORMAL HIGH (ref 70–99)
Glucose-Capillary: 148 mg/dL — ABNORMAL HIGH (ref 70–99)
Glucose-Capillary: 182 mg/dL — ABNORMAL HIGH (ref 70–99)

## 2020-09-19 MED ORDER — GLYCOPYRROLATE 1 MG PO TABS
1.0000 mg | ORAL_TABLET | ORAL | Status: DC | PRN
  Filled 2020-09-19: qty 1

## 2020-09-19 MED ORDER — GLYCOPYRROLATE 0.2 MG/ML IJ SOLN: 0.2000 mg | INTRAMUSCULAR | Status: DC | PRN

## 2020-09-19 MED ORDER — OXYCODONE HCL ER 10 MG PO T12A
10.0000 mg | EXTENDED_RELEASE_TABLET | Freq: Two times a day (BID) | ORAL | Status: DC
  Administered 2020-09-19: 10 mg via ORAL
  Filled 2020-09-19 (×2): qty 1

## 2020-09-19 MED ORDER — LORAZEPAM 1 MG PO TABS: 1.0000 mg | ORAL_TABLET | ORAL | Status: DC | PRN

## 2020-09-19 MED ORDER — OXYCODONE HCL 5 MG/5ML PO SOLN: 5.0000 mg | ORAL | Status: DC | PRN

## 2020-09-19 MED ORDER — HYDROMORPHONE HCL 1 MG/ML IJ SOLN
0.5000 mg | INTRAMUSCULAR | Status: DC | PRN
  Administered 2020-09-19 – 2020-09-20 (×5): 0.5 mg via INTRAVENOUS
  Filled 2020-09-19 (×6): qty 1

## 2020-09-19 NOTE — Progress Notes (Signed)
Daily Progress Note   Patient Name: Madeline Burke       Date: 09/19/2020 DOB: 1933/11/29  Age: 87 y.o. MRN#: [id number] Attending Physician: Barb Merino, MD Primary Care Physician: Leeroy Cha, MD Admit Date: 09/10/2020  Reason for Consultation/Follow-up: Establishing goals of care  Subjective: Madeline Burke is awake, alert. 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 Pleasantly confused, laughs intermittently during my discussion with her daughter.  Met again with Madeline Burke and Madeline Burke and then spoke with Madeline Burke by phone.  We discussed that Ariyel's kidneys are continuing to decline.  She is no longer a candidate for more Velcade.  She is suffering with her wound in her arm.  All agreed that best option for [facility] at this point is to get her home with Hospice and support her through her end of life journey.   Review of Systems  Unable to perform ROS: Dementia    Length of Stay: 8  Current Medications: Scheduled Meds:  . acyclovir  200 mg Oral BID  . carvedilol  6.25 mg Oral BID WC  . cycloSPORINE  1 drop Both Eyes BID  . dexamethasone (DECADRON) injection  10 mg Intravenous Weekly  . feeding supplement  237 mL Oral TID BM  . insulin aspart  0-9 Units Subcutaneous TID WC  . mouth rinse  15 mL Mouth Rinse BID  . multivitamin with minerals  1 tablet Oral Daily  . oxyCODONE  10 mg Oral Q12H  . pantoprazole  40 mg Oral Daily  . rosuvastatin  10 mg Oral Daily  . sodium bicarbonate  650 mg Oral BID  . sodium chloride flush  3 mL Intravenous Q12H    Continuous Infusions:   PRN Meds: acetaminophen **OR** acetaminophen, EPINEPHrine, HYDROmorphone (DILAUDID) injection, ipratropium-albuterol, LORazepam, ondansetron **OR** ondansetron (ZOFRAN) IV, oxyCODONE-acetaminophen, polyethylene  glycol  Physical Exam Vitals and nursing note reviewed.  Pulmonary:     Effort: Pulmonary effort is normal.  Musculoskeletal:     Comments: RUE with sig edema, surgical wound covered with ABD pad             Vital Signs: BP (!) 116/48 (BP Location: Right Arm)   Pulse 81   Temp 98.1 F (36.7 C) (Oral)   Resp 14   Ht 5' 2"  (1.575 m)   Wt 56.7 kg  SpO2 94%   BMI 22.86 kg/m  SpO2: SpO2: 94 % O2 Device: O2 Device: Room Air O2 Flow Rate: O2 Flow Rate (L/min): 6 L/min  Intake/output summary:   Intake/Output Summary (Last 24 hours) at 09/19/2020 1653 Last data filed at 09/19/2020 0500 Gross per 24 hour  Intake 1401.22 ml  Output 550 ml  Net 851.22 ml   LBM: Last BM Date: 09/13/20 Baseline Weight: Weight: 56.7 kg Most recent weight: Weight: 56.7 kg       Palliative Assessment/Data: PPS: 20%      Patient Active Problem List   Diagnosis Date Noted  . Multiple myeloma ([place]) 09/12/2020  . ARF (acute renal failure) ([place]) 09/11/2020  . AKI (acute kidney injury) ([place]) 09/10/2020  . Abdominal pain, epigastric 09/10/2020  . HTN (hypertension) 09/10/2020  . Diabetes ([place]) 09/10/2020  . Anemia 09/10/2020  . Angio-edema 02/08/2018    Palliative Care Assessment & Plan   Patient Profile: 87 y.o.femalewith past medical history of dementia, DM 2, HLD, hypertension, recent diagnosis of multiple myeloma with findings of plasma cell neoplasm on bone marrow biopsy-started on Velcade in this admission 3/26-admitted on 3/23/2022with complaint of abdominal pain and syncope.Shewas started on PPI.Further work-up revealed metabolic acidosis, acute kidney injury.Admission has been complicated due to developing a large hematoma from a brachial artery catheterization injury,developed compartment syndrome and required emergent fasciotomy. EEG completed shows diffuse cerebral dysfunction.Palliative medicine consulted for "goals of care,high risk 75-monthmortality".     Assessment/Recommendations/Plan  Transition to comfort measures- d/c labs, maintenance meds, continue interventions for pain and comfort TOC referral to arrange Hospice services at home Home wound care recommendations from surgery would be beneficial prior to discharge  Goals of Care and Additional Recommendations: Limitations on Scope of Treatment: Full Comfort Care  Code Status: DNR  Prognosis:  < 2 weeks- due to rapidly progressing renal failure, failure to thrive, in the setting of multiple myeloma and dementia and plans for full comfort measures only   Discharge Planning: Home with Hospice  Care plan was discussed with patient's daughters  Thank you for allowing the Palliative Medicine Team to assist in the care of this patient.   Total time: 48 minutes Greater than 50%  of this time was spent counseling and coordinating care related to the above assessment and plan.  KMariana Kaufman AGNP-C Palliative Medicine   Please contact Palliative Medicine Team phone at [phone] questions and concerns.

## 2020-09-19 NOTE — Anesthesia Postprocedure Evaluation (Signed)
Anesthesia Post Note  Patient: Madeline Burke  Procedure(s) Performed: LEFT ARM WASHOUT (Left Arm Upper) LEFT ARM FASCIOTOMY CLOSURE (Left Arm Upper)     Patient location during evaluation: PACU Anesthesia Type: General Level of consciousness: awake Pain management: pain level controlled Vital Signs Assessment: post-procedure vital signs reviewed and stable Respiratory status: spontaneous breathing, nonlabored ventilation, respiratory function stable and patient connected to nasal cannula oxygen Cardiovascular status: blood pressure returned to baseline and stable Postop Assessment: no apparent nausea or vomiting Anesthetic complications: no   No complications documented.  Last Vitals:  Vitals:   09/19/20 0450 09/19/20 0835  BP: (!) 152/69 (!) 145/65  Pulse:    Resp: 18 18  Temp: 37.1 C 36.6 C  SpO2: 97% 100%    Last Pain:  Vitals:   09/19/20 0835  TempSrc: Oral  PainSc:                  Madeline Burke

## 2020-09-19 NOTE — Progress Notes (Signed)
PROGRESS NOTE    Madeline Burke  NFA:[id number] DOB: [birth year] DOA: 09/10/2020 PCP: Madeline Cha, MD    Chief Complaint  Patient presents with  . Abdominal Pain    Brief Narrative:  87 year old with prior h/o hypertension, hyperlipidemia, DM, angioedema, recently diagnosed multiple myeloma presented to the [facility] long hospital with abdominal pain.   She was found to have acute kidney injury, metabolic acidosis, creatinine worsened from baseline creatinine of 1 -2.5.  Patient had confusion and a blood gas was drawn from the left brachial artery that resulted in arterial laceration and compartment syndrome and was transferred to [facility] with vascular surgery.   Patient underwent exploration of the left brachial artery and  Fasciotomy with wound VAC placement on 3/28. Remains in the hospital with multiple issues, worsening renal functions and mental status. Patient also received a dose of dexamethasone and Velcade with oncology on 3/25.  Next dose held secondary to vascular complications.  Hospital course is complicated with profound debility, now with confusion, left arm pain and difficulty to control symptoms.  Poor appetite and failure to thrive.  Assessment & Plan:   Principal Problem:   AKI (acute kidney injury) ([place]) Active Problems:   Abdominal pain, epigastric   HTN (hypertension)   Diabetes ([place])   Anemia   ARF (acute renal failure) ([facility])   Left forearm compartment syndrome: Left upper extremity hematoma, due to left brachial artery injury with compartment syndrome secondary to arterial injury while doing arterial puncture.   Vascular surgery consulted and she underwent exploration of the left brachial artery and placement of VAC dressing on 3/28.  Adequate pain control.  Patient is still with severe pain, added injectable Dilaudid for pain control and to help with mobility and dressing change. Secondary wound closure on 3/31. Remains painful but  adequate vascularity.  Unable to participate on therapies and rehab.  AKI with hyperkalemia and metabolic acidosis:  Secondary to multiple myeloma.  500 mL urine output last 24 hours. Renal functions continues to worsen.  Recent normal renal functions.  Now with worsening kidney functions and multiple issues, unsure about recovery. Followed by nephrology.  Unlikely dialysis candidate, currently on maintenance IV fluids.  Acute anemia of blood loss from left upper extremity hematoma and surgical exploration of the left brachial artery.  Anemia of chronic disease. S/p 3 units of prbc transfusion and 2 units of FFP 3/28 2 units of PRBC on 3/30.  Hemoglobin 8.5.  Drifting down again.  Thrombocytopenia:  Platelets on admission 148,000 dropping now.  Platelets 67.  Syncope:  Initially suspected possible vasovagal or neuro cardiogenic in the setting of severe pain. Initial CT head and MRI brain negative for acute infarct.  Probably due to profound debility.  Multiple Myeloma light chain disease:  S/p first dose of Velcade and dexamethasone on Friday 09/12/20.  Followed by oncology.  Patient with profound debility, now with confusion, poor recovery, ongoing pain and poor appetite.  She is probably a hospice candidate. Followed by palliative.  Family meetings conducted by palliative care team with 3 daughters. DNR\DNI. Continue to monitor for any progress. If no adequate improvement, desires hospice at a facility. Inadequate pain control with as needed pain medications. Will start patient on long-acting OxyContin 10 mg 2 times a day, increase frequency of Dilaudid to 0.5 mg every 2 hours.  Hypertension: Well controlled.  On Coreg with parameters.    Type 2 Diabetes Mellitus:  CBG (last 3)  Recent Labs  09/18/20 2117 09/19/20 0607 09/19/20 1203  GLUCAP 147* 124* 148*   Resumed SSI.  Fairly stable.  Hyperlipidemia:  Continue with crestor.    DVT prophylaxis: Scd's Code Status:  fULL CODE.  Family Communication: Patient's daughter at the bedside. Disposition:   Status is: Inpatient  Remains inpatient appropriate because:Ongoing diagnostic testing needed not appropriate for outpatient work up and IV treatments appropriate due to intensity of illness or inability to take PO   Dispo: The patient is from: Home              Anticipated d/c is to: pending, possible hospice.              Patient currently is not medically stable to d/c.   Difficult to place patient No       Consultants:   Vascular surgery  Surgery.   Nephrology  Palliative medicine  Procedures:  Brachial artery exploration/ repair , placement of VAC dressing.  Left arm wound closure 3/31.  Antimicrobials:  Antibiotics Given (last 72 hours)    Date/Time Action Medication Dose   09/16/20 2208 Given   acyclovir (ZOVIRAX) 200 MG capsule 200 mg 200 mg   09/17/20 0943 Given   acyclovir (ZOVIRAX) 200 MG capsule 200 mg 200 mg   09/17/20 2308 Given   acyclovir (ZOVIRAX) 200 MG capsule 200 mg 200 mg   09/18/20 2249 Given   acyclovir (ZOVIRAX) 200 MG capsule 200 mg 200 mg   09/19/20 1037 Given   acyclovir (ZOVIRAX) 200 MG capsule 200 mg 200 mg      Subjective: Patient seen and examined in the morning rounds.  She had just received IV Dilaudid and was a still very painful.  Patient was confused and complaining of pain in the arm.  She did not let me touch her legs, she was anxious and scared. Daughter at the bedside.  One of the daughter been at the bedside all the time and they stated that patient is hardly eating any food.  She has hardly eating 25% of her meals.  Objective: Vitals:   09/18/20 2250 09/19/20 0450 09/19/20 0835 09/19/20 1158  BP: (!) 156/73 (!) 152/69 (!) 145/65 (!) 104/46  Pulse:    81  Resp: 18 18 18 17   Temp: 98.2 F (36.8 C) 98.8 F (37.1 C) 97.8 F (36.6 C) 98.2 F (36.8 C)  TempSrc: Oral Oral Oral Oral  SpO2: 95% 97% 100% 100%  Weight:      Height:         Intake/Output Summary (Last 24 hours) at 09/19/2020 1328 Last data filed at 09/19/2020 0500 Gross per 24 hour  Intake 1601.22 ml  Output 550 ml  Net 1051.22 ml   Filed Weights   09/10/20 1829 09/18/20 1110  Weight: 56.7 kg 56.7 kg    Examination:  General exam: Frail and debilitated.  Sick looking female on room air. Patient is alert and oriented x1.  She is pleasant at times to conversation.  Looks confused and repeating same sentences. Respiratory system: Clear to auscultation. Respiratory effort normal. Cardiovascular system: S1 & S2 heard, RRR. No JVD,  No pedal edema. Gastrointestinal system: Abdomen is nondistended, soft and nontender. Normal bowel sounds heard. Central nervous system: Alert and oriented x2-3.  Moves all extremities. Extremities: Left upper extremity on postop dressing, not removed by me.  Distal blood supply is adequate, she has some blisters on the dorsal aspect of the wrist.  Very painful. Skin: No rashes Psychiatry:  Mood & affect  anxious.    Data Reviewed: I have personally reviewed following labs and imaging studies  CBC: Recent Labs  Lab 09/13/20 2325 09/14/20 0516 09/15/20 1027 09/15/20 1229 09/16/20 0530 09/17/20 1033 09/18/20 0251 09/19/20 0145  WBC 8.4   < > 21.7*  --  13.2* 13.7* 7.9 11.9*  NEUTROABS 5.0  --   --   --   --  2.7  --   --   HGB 7.0*   < > 5.6* 5.1* 9.4* 5.2* 10.1* 8.5*  HCT 22.1*   < > 17.8* 15.0* 26.9* 15.4* 28.9* 25.0*  MCV 88.8   < > 90.4  --  84.9 89.0 88.1 89.9  PLT 110*   < > 101*  --  66* 63* 51* 67*   < > = values in this interval not displayed.    Basic Metabolic Panel: Recent Labs  Lab 09/15/20 0443 09/15/20 1027 09/15/20 1229 09/16/20 0530 09/17/20 1033 09/18/20 0251 09/19/20 0145  NA 140 142 143 139 137 137 139  K 4.9 5.4* 5.1 5.4* 4.9 4.6 5.1  CL 114* 113* 113* 112* 109 108 109  CO2 20* 20*  --  18* 21* 21* 19*  GLUCOSE 218* 224* 198* 200* 172* 107* 119*  BUN 32* 31* 33* 34* 46* 48* 47*   CREATININE 1.88* 2.13* 2.30* 2.33* 3.53* 3.81* 4.09*  CALCIUM 7.6* 7.7*  --  7.1* 6.7* 6.5* 6.5*  MG 1.8  --   --   --   --   --   --   PHOS  --   --   --   --   --   --  6.8*    GFR: Estimated Creatinine Clearance: 8 mL/min (A) (by C-G formula based on SCr of 4.09 mg/dL (H)).  Liver Function Tests: Recent Labs  Lab 09/13/20 0619 09/13/20 2325 09/14/20 0516 09/15/20 0443 09/17/20 1033 09/19/20 0145  AST 24 91* 91* 59* 22  --   ALT 13 38 33 32 18  --   ALKPHOS 46 52 55 56 44  --   BILITOT 0.4 0.6 0.6 0.4 0.7  --   PROT 6.2* 6.4* 6.4* 5.6* 4.9*  --   ALBUMIN 3.6 3.8 3.7 3.1* 2.5* 2.2*    CBG: Recent Labs  Lab 09/18/20 1328 09/18/20 1657 09/18/20 2117 09/19/20 0607 09/19/20 1203  GLUCAP 145* 226* 147* 124* 148*     Recent Results (from the past 240 hour(s))  SARS CORONAVIRUS 2 (TAT 6-24 HRS) Nasopharyngeal Nasopharyngeal Swab     Status: None   Collection Time: 09/10/20 10:42 PM   Specimen: Nasopharyngeal Swab  Result Value Ref Range Status   SARS Coronavirus 2 NEGATIVE NEGATIVE Final    Comment: (NOTE) SARS-CoV-2 target nucleic acids are NOT DETECTED.  The SARS-CoV-2 RNA is generally detectable in upper and lower respiratory specimens during the acute phase of infection. Negative results do not preclude SARS-CoV-2 infection, do not rule out co-infections with other pathogens, and should not be used as the sole basis for treatment or other patient management decisions. Negative results must be combined with clinical observations, patient history, and epidemiological information. The expected result is Negative.  Fact Sheet for Patients: SugarRoll.be  Fact Sheet for Healthcare Providers: https://www.woods-mathews.com/  This test is not yet approved or cleared by the [place] FDA and  has been authorized for detection and/or diagnosis of SARS-CoV-2 by FDA under an Emergency Use Authorization (EUA). This EUA will  remain  in effect (meaning this test can be used)  for the duration of the COVID-19 declaration under Se ction 564(b)(1) of the Act, 21 U.S.C. section 360bbb-3(b)(1), unless the authorization is terminated or revoked sooner.  Performed at [facility], [place] [address], [place], [place] [address]          Radiology Studies: No results found.      Scheduled Meds: . acyclovir  200 mg Oral BID  . carvedilol  6.25 mg Oral BID WC  . cycloSPORINE  1 drop Both Eyes BID  . dexamethasone (DECADRON) injection  10 mg Intravenous Weekly  . feeding supplement  237 mL Oral TID BM  . insulin aspart  0-9 Units Subcutaneous TID WC  . mouth rinse  15 mL Mouth Rinse BID  . multivitamin with minerals  1 tablet Oral Daily  . oxyCODONE  10 mg Oral Q12H  . pantoprazole  40 mg Oral Daily  . rosuvastatin  10 mg Oral Daily  . sodium bicarbonate  650 mg Oral BID  . sodium chloride flush  3 mL Intravenous Q12H   Continuous Infusions:    LOS: 8 days    Time spent: 30 minutes    Barb Merino, MD [facility]   09/19/2020, 1:28 PM

## 2020-09-19 NOTE — Progress Notes (Signed)
[facility] Progress Note   Subjective:  S/p OR yesterday for washout and closure.  Ins yesterday 1.6L, UOP 550 via purewick. Cr 4.1 from 3.8.  Hb 5.2 > 10.1 > 8.5 now.  Daughter bedside this AM - worsening pain.  Palliative care conversations reviewed.   Objective Vitals:   09/18/20 1657 09/18/20 1935 09/18/20 2250 09/19/20 0450  BP: (!) 186/95 125/72 (!) 156/73 (!) 152/69  Pulse: 83     Resp: 17 17 18 18   Temp: 98.1 F (36.7 C) 97.6 F (36.4 C) 98.2 F (36.8 C) 98.8 F (37.1 C)  TempSrc: Oral Oral Oral Oral  SpO2: 99% 96% 95% 97%  Weight:      Height:       Physical Exam General: lying in bed uncomfortably currently due to BP check in unaffected arm Heart: RRR< no rub Lungs: clear, normal WOB Abdomen: soft Extremities: LUE dressing intact; no LE edema ENT: MMM  Additional Objective Labs: Basic Metabolic Panel: Recent Labs  Lab 09/17/20 1033 09/18/20 0251 09/19/20 0145  NA 137 137 139  K 4.9 4.6 5.1  CL 109 108 109  CO2 21* 21* 19*  GLUCOSE 172* 107* 119*  BUN 46* 48* 47*  CREATININE 3.53* 3.81* 4.09*  CALCIUM 6.7* 6.5* 6.5*  PHOS  --   --  6.8*   Liver Function Tests: Recent Labs  Lab 09/14/20 0516 09/15/20 0443 09/17/20 1033 09/19/20 0145  AST 91* 59* 22  --   ALT 33 32 18  --   ALKPHOS 55 56 44  --   BILITOT 0.6 0.4 0.7  --   PROT 6.4* 5.6* 4.9*  --   ALBUMIN 3.7 3.1* 2.5* 2.2*   No results for input(s): LIPASE, AMYLASE in the last 168 hours. CBC: Recent Labs  Lab 09/13/20 2325 09/14/20 0516 09/15/20 1027 09/15/20 1229 09/16/20 0530 09/17/20 1033 09/18/20 0251 09/19/20 0145  WBC 8.4   < > 21.7*  --  13.2* 13.7* 7.9 11.9*  NEUTROABS 5.0  --   --   --   --  2.7  --   --   HGB 7.0*   < > 5.6*   < > 9.4* 5.2* 10.1* 8.5*  HCT 22.1*   < > 17.8*   < > 26.9* 15.4* 28.9* 25.0*  MCV 88.8   < > 90.4  --  84.9 89.0 88.1 89.9  PLT 110*   < > 101*  --  66* 63* 51* 67*   < > = values in this interval not displayed.   Blood  Culture No results found for: SDES, SPECREQUEST, CULT, REPTSTATUS  Cardiac Enzymes: No results for input(s): CKTOTAL, CKMB, CKMBINDEX, TROPONINI in the last 168 hours. CBG: Recent Labs  Lab 09/18/20 1044 09/18/20 1328 09/18/20 1657 09/18/20 2117 09/19/20 0607  GLUCAP 126* 145* 226* 147* 124*   Iron Studies: No results for input(s): IRON, TIBC, TRANSFERRIN, FERRITIN in the last 72 hours. @lablastinr3 @ Studies/Results: No results found. Medications: . sodium chloride 75 mL/hr at 09/19/20 0730  . vancomycin     . acyclovir  200 mg Oral BID  . carvedilol  6.25 mg Oral BID WC  . cycloSPORINE  1 drop Both Eyes BID  . dexamethasone (DECADRON) injection  10 mg Intravenous Weekly  . feeding supplement  237 mL Oral TID BM  . insulin aspart  0-9 Units Subcutaneous TID WC  . mouth rinse  15 mL Mouth Rinse BID  . multivitamin with minerals  1 tablet Oral Daily  .  pantoprazole  40 mg Oral Daily  . rosuvastatin  10 mg Oral Daily  . sodium bicarbonate  650 mg Oral BID  . 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  I suspect this is related to hemodynamic insults with modest hypotension, ABLA.  We gave MIVF yesterday with NPO status but will hold at this point given taking po and wish to avoid volume overload . At this point she doesn't have indications for dialysis and hopefully she won't develop. I discussed this with daughter yesterday and again today with the other daughter- she wouldn't be a good long term HD candidate and I wouldn't recommend doing it in light of age and comorbids.  Daughters agrees and palliative care is involved too.  Continue supportive care for now hoping for renal recovery but looks like will probably moving in a more palliative  direction. Avoid further insults including hypotension, hypovolemia, nephrotoxins.  Follow I/Os.   **Multiple myeloma: heme/onc has initiated treatment with velcade and decadron - 1st cycle last week after recent diagnosis.    **LUE compartment syndrome: s/p evacuation and fasciotomy. Closed yesterday. VVS following.  **Anemia: multifactorial with a component of ABLA.  Transfusion per primary.  Hematology following for myeloma.   **HTN:  Baseline HTN on coreg 25 BID, amlodipine 2.5 daily at home; amlodipine held and coreg reduced.   Put hold parameter for coreg to hold if < 110.   **Hyperkalemia: being managed with lokelma PRN.    Jannifer Hick MD 09/19/2020, 8:14 AM  [facility] Pager: [phone]

## 2020-09-19 NOTE — Plan of Care (Signed)
  Problem: Education: Goal: Knowledge of General Education information will improve Description: Including pain rating scale, medication(s)/side effects and non-pharmacologic comfort measures Outcome: Progressing   Problem: Health Behavior/Discharge Planning: Goal: Ability to manage health-related needs will improve Outcome: Not Progressing   Problem: Clinical Measurements: Goal: Ability to maintain clinical measurements within normal limits will improve Outcome: Not Progressing Goal: Will remain free from infection Outcome: Progressing Goal: Diagnostic test results will improve Outcome: Not Progressing Goal: Respiratory complications will improve Outcome: Progressing Goal: Cardiovascular complication will be avoided Outcome: Progressing   Problem: Activity: Goal: Risk for activity intolerance will decrease Outcome: Not Progressing   Problem: Nutrition: Goal: Adequate nutrition will be maintained Outcome: Not Progressing   Problem: Nutrition: Goal: Adequate nutrition will be maintained Outcome: Not Progressing   Problem: Coping: Goal: Level of anxiety will decrease Outcome: Progressing   Problem: Elimination: Goal: Will not experience complications related to bowel motility Outcome: Progressing Goal: Will not experience complications related to urinary retention Outcome: Progressing   Problem: Elimination: Goal: Will not experience complications related to bowel motility Outcome: Progressing Goal: Will not experience complications related to urinary retention Outcome: Progressing   Problem: Pain Managment: Goal: General experience of comfort will improve Outcome: Progressing   Problem: Safety: Goal: Ability to remain free from injury will improve Outcome: Progressing   Problem: Skin Integrity: Goal: Risk for impaired skin integrity will decrease Outcome: Progressing

## 2020-09-19 NOTE — Progress Notes (Addendum)
  Progress Note    09/19/2020 11:02 AM 1 Day Post-Op  Subjective:  L arm painful to touch or with any passive ROM   Vitals:   09/19/20 0450 09/19/20 0835  BP: (!) 152/69 (!) 145/65  Pulse:    Resp: 18 18  Temp: 98.8 F (37.1 C) 97.8 F (36.6 C)  SpO2: 97% 100%   Physical Exam: Lungs:  Non labored Incisions:  Incisions of LUE c/d/i Extremities:  Palpable L radial pulse Neurologic: baseline  CBC    Component Value Date/Time   WBC 11.9 (H) 09/19/2020 0145   RBC 2.78 (L) 09/19/2020 0145   HGB 8.5 (L) 09/19/2020 0145   HGB 9.1 (L) 08/25/2020 1518   HCT 25.0 (L) 09/19/2020 0145   PLT 67 (L) 09/19/2020 0145   PLT 134 (L) 08/25/2020 1518   MCV 89.9 09/19/2020 0145   MCH 30.6 09/19/2020 0145   MCHC 34.0 09/19/2020 0145   RDW 16.7 (H) 09/19/2020 0145   LYMPHSABS 3.2 09/17/2020 1033   MONOABS 7.6 (H) 09/17/2020 1033   EOSABS 0.0 09/17/2020 1033   BASOSABS 0.1 09/17/2020 1033    BMET    Component Value Date/Time   NA 139 09/19/2020 0145   K 5.1 09/19/2020 0145   CL 109 09/19/2020 0145   CO2 19 (L) 09/19/2020 0145   GLUCOSE 119 (H) 09/19/2020 0145   BUN 47 (H) 09/19/2020 0145   CREATININE 4.09 (H) 09/19/2020 0145   CREATININE 1.97 (H) 08/25/2020 1518   CALCIUM 6.5 (L) 09/19/2020 0145   GFRNONAA 10 (L) 09/19/2020 0145   GFRNONAA 25 (L) 08/25/2020 1518   GFRAA >60 05/13/2019 0850    INR    Component Value Date/Time   INR 1.4 (H) 09/16/2020 0530     Intake/Output Summary (Last 24 hours) at 09/19/2020 1102 Last data filed at 09/19/2020 0500 Gross per 24 hour  Intake 1601.22 ml  Output 850 ml  Net 751.22 ml     Assessment/Plan:  87 y.o. female is s/p closure of L arm fasciotomy incisions after brachial artery repair 1 Day Post-Op   ACE wrap removed; incisions currently c/d/i; can change dry dressing and replace ACE wrap if there is any bleeding from incisions Palpable L radial pulse Family discussing goals of care with palliative medicine   Dagoberto Ligas, PA-C [facility] [phone] 09/19/2020 11:02 AM  Agree with above. Still not moving left arm some blisters on left hand but viable tissue Will recheck Monday if pt still in hospital Dr Carlis Abbott on call this weekend if questions  Ruta Hinds, MD [facility] [place] Office: [phone]

## 2020-09-19 NOTE — Progress Notes (Signed)
PT Cancellation Note  Patient Details Name: Madeline Burke MRN: [id number] DOB: 05-02-1934   Cancelled Treatment:    Reason Eval/Treat Not Completed: Pain limiting ability to participate. Increased pain following yesterday's procedure. MD increased pain meds this AM. Unable to tolerate participation in therapy at this time. Daughter present in room and reports family unsure of care path that they wish to pursue (considering comfort care).  PT to continue to follow and proceed as pt tolerates and as per family wishes.   Lorriane Shire 09/19/2020, 10:23 AM   Lorrin Goodell, PT  Office # [phone] Pager [phone]

## 2020-09-19 NOTE — Progress Notes (Signed)
Occupational Therapy Treatment Patient Details Name: Madeline Burke MRN: [id number] DOB: 1933/07/21 Today's Date: 09/19/2020    History of present illness Madeline Burke is 87 year old f who presents with abdominal pain. She was being work up for elevated IG levels as outpatient. She was found to be in AKI , metabolic acidosis with worsening of baseline creatinine from 1 to 2.5, thought to be secondary to Multiple myeloma. She developed left arm swelling, with a drop in hemoglobin to 5.4 from a baseline of 9 s/p 2 units PRBCs. She was found to have LUE hematoma, due to L brachial artery injury with compartment syndrome probably sec to brachial artery catheterization. Vascular surgery consulted and she underwent exploration of the left brachial artery and placement of VAC dressing on 3/28. She was transferred to [facility] as per vascular surgery recommendations as she continued to having oozing from fasciotomies of the left arm. PMH: hypertension, hyperlipidemia, DM, angioedema, Multiple Myeloma, dementia.   OT comments  Patient with significant change in functional and mobility status.  Patient is lethargic, keeping eyes closed, and in pain with any attempted ROM to LUE.  OT was able to position her hand above her elbow for edema control, but minimal ROM to digits of L hand and wrist were accomplished.  OT will look to coordinate with nursing for further attempts at ROM to ensure proper pain meds are on board.  Patient is looking to speak with Palliative, and appear to be considering Hospice.  OT will continue efforts according to families wishes.    Follow Up Recommendations  Other (comment) (family is discussing hospice)    Equipment Recommendations       Recommendations for Other Services      Precautions / Restrictions Precautions Precautions: Fall Precaution Comments: very painful LUE s/p fasciotomy Restrictions LUE Weight Bearing: Non weight bearing Other Position/Activity Restrictions: Forearm  incision is closed, medial incision to upper arm is open from elbow to armpit.  Dressings are applied.                                   Cognition Arousal/Alertness: Lethargic;Suspect due to medications Behavior During Therapy: Restless;Anxious Overall Cognitive Status: Impaired/Different from baseline                                 General Comments: patient eyes closed throughout, repeating Jesus over and over.                          Pertinent Vitals/ Pain       Pain Assessment: Faces Faces Pain Scale: Hurts whole lot Pain Location: L arm Pain Descriptors / Indicators: Grimacing;Crying;Guarding;Moaning Pain Intervention(s): Limited activity within patient's tolerance                                                          Frequency  Min 2X/week        Progress Toward Goals  OT Goals(current goals can now be found in the care plan section)     Acute Rehab OT Goals OT Goal Formulation: Patient unable to participate in goal setting Time For Goal Achievement: 09/26/20  Potential to Achieve Goals: [place] Discharge plan needs to be updated    Co-evaluation                 AM-PAC OT "6 Clicks" Daily Activity     Outcome Measure   Help from another person eating meals?: Total Help from another person taking care of personal grooming?: Total Help from another person toileting, which includes using toliet, bedpan, or urinal?: Total Help from another person bathing (including washing, rinsing, drying)?: Total Help from another person to put on and taking off regular upper body clothing?: Total Help from another person to put on and taking off regular lower body clothing?: Total 6 Click Score: 6    End of Session    OT Visit Diagnosis: Unsteadiness on feet (R26.81);Muscle weakness (generalized) (M62.81);Pain Pain - Right/Left: Left Pain - part of body: Arm   Activity Tolerance Patient limited  by pain   Patient Left in bed;with call bell/phone within reach;with family/visitor present   Nurse Communication          Time: 2162-4469 OT Time Calculation (min): 14 min  Charges: OT General Charges $OT Visit: 1 Visit OT Treatments $Therapeutic Exercise: 8-22 mins  09/19/2020  Rich, OTR/L  Acute Rehabilitation Services  Office:  [place] 09/19/2020, 2:53 PM

## 2020-09-19 NOTE — Progress Notes (Deleted)
Patient referred by Leeroy Cha,* for ***  Subjective:   Madeline Burke, female    DOB: 08/03/1933, 87 y.o.   MRN: [id number]  *** No chief complaint on file.   *** HPI  87 y.o. *** female with ***  *** Past Medical History:  Diagnosis Date  . Diabetes mellitus without complication ([place])   . Elevated cholesterol   . History of angioedema 2006  . Hypertension     *** Past Surgical History:  Procedure Laterality Date  . ABDOMINAL HYSTERECTOMY  1997  . APPLICATION OF WOUND VAC Left 09/15/2020   Procedure: APPLICATION OF WOUND VAC;  Surgeon: Elam Dutch, MD;  Location: [place];  Service: Vascular;  Laterality: Left;  . BREAST CYST EXCISION Right 1997  . FASCIOTOMY Left 09/15/2020   Procedure: LIMITED DORSO-LATERAL LEFT ARM FASCIOTOMY;  Surgeon: Elam Dutch, MD;  Location: [facility];  Service: Vascular;  Laterality: Left;  . FASCIOTOMY CLOSURE Left 09/18/2020   Procedure: LEFT ARM FASCIOTOMY CLOSURE;  Surgeon: Waynetta Sandy, MD;  Location: [place];  Service: Vascular;  Laterality: Left;  . HEMATOMA EVACUATION Left 09/15/2020   Procedure: EXPLORATION OF LEFT BRACHIAL ARTERY;  Surgeon: Elam Dutch, MD;  Location: [place];  Service: Vascular;  Laterality: Left;  . INCISION AND DRAINAGE Left 09/18/2020   Procedure: LEFT ARM WASHOUT;  Surgeon: Waynetta Sandy, MD;  Location: [place];  Service: Vascular;  Laterality: Left;  Marland Kitchen VENOUS ABLATION  04/06/2013   right leg     *** Social History   Tobacco Use  Smoking Status Never Smoker  Smokeless Tobacco Never Used    Social History   Substance and Sexual Activity  Alcohol Use No    *** Family History  Problem Relation Age of Onset  . Diabetes Other   . Hyperlipidemia Other   . Hypertension Other     *** Current Facility-Administered Medications on File Prior to Visit  Medication Dose Route Frequency Provider Last Rate Last Admin  . acetaminophen (TYLENOL) tablet 650 mg  650 mg  Oral Q6H PRN Waynetta Sandy, MD   650 mg at 09/17/20 2308   Or  . acetaminophen (TYLENOL) suppository 650 mg  650 mg Rectal Q6H PRN Waynetta Sandy, MD      . acyclovir (ZOVIRAX) 200 MG capsule 200 mg  200 mg Oral BID Waynetta Sandy, MD   200 mg at 09/19/20 1037  . carvedilol (COREG) tablet 6.25 mg  6.25 mg Oral BID WC Waynetta Sandy, MD   6.25 mg at 09/19/20 0849  . cycloSPORINE (RESTASIS) 0.05 % ophthalmic emulsion 1 drop  1 drop Both Eyes BID Waynetta Sandy, MD   1 drop at 09/18/20 2255  . dexamethasone (DECADRON) injection 10 mg  10 mg Intravenous Weekly Waynetta Sandy, MD   10 mg at 09/19/20 1034  . EPINEPHrine (EPI-PEN) injection 0.3 mg  0.3 mg Intramuscular PRN Waynetta Sandy, MD      . feeding supplement (ENSURE ENLIVE / ENSURE PLUS) liquid 237 mL  237 mL Oral TID BM Waynetta Sandy, MD      . HYDROmorphone (DILAUDID) injection 0.5 mg  0.5 mg Intravenous Q2H PRN Barb Merino, MD   0.5 mg at 09/19/20 0849  . insulin aspart (novoLOG) injection 0-9 Units  0-9 Units Subcutaneous TID WC Waynetta Sandy, MD   3 Units at 09/18/20 1746  . ipratropium-albuterol (DUONEB) 0.5-2.5 (3) MG/3ML nebulizer solution 3 mL  3 mL  Nebulization Q4H PRN Waynetta Sandy, MD   3 mL at 09/14/20 1231  . LORazepam (ATIVAN) injection 2 mg  2 mg Intravenous Q4H PRN Waynetta Sandy, MD      . MEDLINE mouth rinse  15 mL Mouth Rinse BID Barb Merino, MD      . multivitamin with minerals tablet 1 tablet  1 tablet Oral Daily Waynetta Sandy, MD   1 tablet at 09/17/20 [phone]  . ondansetron (ZOFRAN) tablet 4 mg  4 mg Oral Q6H PRN Waynetta Sandy, MD       Or  . ondansetron [facility]) injection 4 mg  4 mg Intravenous Q6H PRN Waynetta Sandy, MD   4 mg at 09/14/20 2128  . oxyCODONE (OXYCONTIN) 12 hr tablet 10 mg  10 mg Oral Q12H Ghimire, Dante Gang, MD      . oxyCODONE-acetaminophen  (PERCOCET/ROXICET) 5-325 MG per tablet 1 tablet  1 tablet Oral Q4H PRN Waynetta Sandy, MD      . pantoprazole (PROTONIX) EC tablet 40 mg  40 mg Oral Daily Waynetta Sandy, MD   40 mg at 09/17/20 8366  . polyethylene glycol (MIRALAX / GLYCOLAX) packet 17 g  17 g Oral Daily PRN Waynetta Sandy, MD      . rosuvastatin (CRESTOR) tablet 10 mg  10 mg Oral Daily Waynetta Sandy, MD   10 mg at 09/17/20 0943  . sodium bicarbonate tablet 650 mg  650 mg Oral BID Waynetta Sandy, MD   650 mg at 09/18/20 2250  . sodium chloride flush (NS) 0.9 % injection 3 mL  3 mL Intravenous Q12H Waynetta Sandy, MD   3 mL at 09/18/20 2256   Current Outpatient Medications on File Prior to Visit  Medication Sig Dispense Refill  . acyclovir (ZOVIRAX) 400 MG tablet Take 1 tablet (400 mg total) by mouth 2 (two) times daily. 60 tablet 5  . albuterol (PROVENTIL) (2.5 MG/3ML) 0.083% nebulizer solution Inhale 3 mLs into the lungs every 6 (six) hours as needed for wheezing or shortness of breath.    Marland Kitchen albuterol (VENTOLIN HFA) 108 (90 Base) MCG/ACT inhaler Inhale 2 puffs into the lungs every 6 (six) hours as needed for wheezing or shortness of breath.    Marland Kitchen amLODipine (NORVASC) 2.5 MG tablet Take 2.5 mg by mouth daily.  1  . carvedilol (COREG) 25 MG tablet Take 25 mg by mouth 2 (two) times daily with a meal.   2  . Cholecalciferol (VITAMIN D3) 2000 units TABS Take 2,000 Units by mouth daily.    . cycloSPORINE (RESTASIS) 0.05 % ophthalmic emulsion Place 1 drop into both eyes 2 (two) times daily.     Marland Kitchen denosumab (PROLIA) 60 MG/ML SOSY injection Inject 60 mg into the skin every 6 (six) months.    . EPINEPHrine 0.3 mg/0.3 mL IJ SOAJ injection Inject 0.3 mLs into the muscle as needed for anaphylaxis.    . fexofenadine (ALLEGRA) 180 MG tablet Take 1 tablet (180 mg total) by mouth daily. (Patient not taking: Reported on 09/10/2020) 90 tablet 5  . lidocaine-prilocaine (EMLA) cream  Apply to affected area once 30 g 3  . mirtazapine (REMERON) 15 MG tablet Take 15 mg by mouth at bedtime.    . Multiple Vitamin (MULTIVITAMIN WITH MINERALS) TABS tablet Take 1 tablet by mouth daily. Centrum Silver    . ondansetron (ZOFRAN) 8 MG tablet Take 1 tablet (8 mg total) by mouth 2 (two) times daily as needed (Nausea or  vomiting). 30 tablet 1  . prochlorperazine (COMPAZINE) 10 MG tablet Take 1 tablet (10 mg total) by mouth every 6 (six) hours as needed (Nausea or vomiting). 30 tablet 1  . rosuvastatin (CRESTOR) 10 MG tablet Take 10 mg by mouth daily.    Marland Kitchen triamcinolone cream (KENALOG) 0.1 % Apply 1 application topically 2 (two) times daily. (Patient not taking: Reported on 09/10/2020) 60 g 0    Cardiovascular and other pertinent studies:  *** EKG ***/***/202***: ***  *** Recent labs: 07/14/2020: Glucose 99, BUN/Cr 22/1.37. EGFR 44. Na/K 143/4.1. Rest of the CMP normal H/H 10.5/31.7. MCV 85.8. Platelets 173 ***HbA1C 6.7% Chol 172, TG 163, HDL 67, LDL 78                                            ***TSH ***normal   *** ROS      *** There were no vitals filed for this visit.   There is no height or weight on file to calculate BMI. There were no vitals filed for this visit.  *** Objective:   Physical Exam    ***     Assessment & Recommendations:   ***  ***  Thank you for referring the patient to [place]. Please feel free to contact with any questions.   Nigel Mormon, MD Pager: [phone] Office: [phone]

## 2020-09-19 NOTE — Progress Notes (Signed)
Brief Oncology Progress Note:  S: Ms. Madeline Burke is an 87 year old African-American female with medical history significant for lambda light chain multiple myeloma who was undergoing evaluation when she was admitted to the hospital with worsening deconditioning.  She received her first dose of chemotherapy in house on 09/12/2020 with just Velcade and dexamethasone.  Unfortunately her hospital course has been complicated by compartment syndrome of the left arm which is required multiple episodes of surgical intervention.  She is struggled with cytopenias and poor wound healing.  She also has worsening kidney function and confusion.  I was able to speak with the daughter earlier in the week regarding the approach to her treatment.  Given her surgical interventions and concern for poor wound healing as well as her marked cytopenias and poor functional status I would recommend holding chemotherapy at this time.  If she were to have a change in clinical status we could readdress and consider administration of chemotherapy in order to address her multiple myeloma, however at this time I do not believe she is a good candidate for further treatment.   A/P:  # Lambda Light Chain Multiple Myeloma --patient is s/p 1 dose of Velcade/Dexamethasone on 3/25 while in house. Further doses have been held due to compartment syndrome and subsequent surgical interventions --recommend continuing to HOLD chemotherapy in setting of surgery on her arm --patient has worsening kidney function, anemia, and thrombocytopenia. This is likely multifactorial from her disease, chemotherapy, and her surgery --given her advanced age and marked deconditioning I do believe that Madeline Burke discussions are reasonable. On our first visit she was a borderline candidate for chemotherapy treatment.  --Oncology can be available for further [place] discussions or questions.  Ledell Peoples, MD Department of Hematology/Oncology [place]  at [facility] Phone: [phone] Pager: [phone] Email: [email or web address]

## 2020-09-20 DIAGNOSIS — F0390 Unspecified dementia without behavioral disturbance: Secondary | ICD-10-CM | POA: Diagnosis not present

## 2020-09-20 DIAGNOSIS — C9000 Multiple myeloma not having achieved remission: Secondary | ICD-10-CM | POA: Diagnosis not present

## 2020-09-20 DIAGNOSIS — Z515 Encounter for palliative care: Secondary | ICD-10-CM | POA: Diagnosis not present

## 2020-09-20 DIAGNOSIS — N179 Acute kidney failure, unspecified: Secondary | ICD-10-CM | POA: Diagnosis not present

## 2020-09-20 MED ORDER — LORAZEPAM 2 MG/ML IJ SOLN: 1.0000 mg | INTRAMUSCULAR | Status: DC | PRN

## 2020-09-20 MED ORDER — CYCLOSPORINE 0.05 % OP EMUL
1.0000 [drp] | Freq: Two times a day (BID) | OPHTHALMIC | Status: DC
  Filled 2020-09-20: qty 1

## 2020-09-20 MED ORDER — FENTANYL 12 MCG/HR TD PT72
1.0000 | MEDICATED_PATCH | TRANSDERMAL | Status: DC
  Administered 2020-09-20: 1 via TRANSDERMAL
  Filled 2020-09-20: qty 1

## 2020-09-20 MED ORDER — OXYCODONE HCL 20 MG/ML PO CONC
5.0000 mg | ORAL | Status: DC | PRN
  Filled 2020-09-20: qty 1

## 2020-09-20 MED ORDER — MORPHINE SULFATE (CONCENTRATE) 10 MG/0.5ML PO SOLN: 10.0000 mg | ORAL | Status: DC | PRN

## 2020-09-20 MED ORDER — LORAZEPAM 2 MG/ML IJ SOLN
0.5000 mg | Freq: Once | INTRAMUSCULAR | Status: AC
  Administered 2020-09-20: 0.5 mg via INTRAVENOUS
  Filled 2020-09-20: qty 1

## 2020-09-20 NOTE — Progress Notes (Signed)
Manufacturing engineer [facility]) Hospital Liaison note.    Received request from [place], [place] for family interest in [facility]. Spoke with family to confirm interest and explain services. Patient information has been forwarded to [facility] for review.   [place] liaison will follow up once eligibility and availability have been determined.   A Please do not hesitate to call with questions.    Thank you,   Farrel Gordon, RN, [place] (listed on [facility] under [place])    [phone]

## 2020-09-20 NOTE — Progress Notes (Signed)
Manufacturing engineer [facility]) Hospital Liaison note.     This patient is approved to transfer to [facility] today.    Please arrange transport.    RN please call report to [phone].   Thank you,     Farrel Gordon, RN, [facility]       [place] (listed on [facility] under Hospice[facility])     [phone]

## 2020-09-20 NOTE — Progress Notes (Signed)
Reviewed chart - has moved to comfort measures.  Call nephrology if we can be of assistance.

## 2020-09-20 NOTE — Progress Notes (Signed)
Pt sleeping when RN came to administer PM medications. Pt was arousable, but disoriented and unable to follow commands. Pt drifting off to sleep while RN talking to her. Pt is not currently alert enough for PO medications. Pt daughter at bedside and is requesting PO medications not be administered at this time. Pt does not appear to be in pain or distress and is resting comfortably. Will continue to monitor.

## 2020-09-20 NOTE — Progress Notes (Addendum)
RN called to room by daughter because pt unable to drink water and started coughing and choking after a small sip. Pt given PRN IV dilaudid for pain and MD paged to request PRN ativan be changed to IV for pt anxiety as she is currently crying and sobbing and anxious.

## 2020-09-20 NOTE — Care Management (Signed)
[facility] called for transport to [facility]. There are 7 transports ahead of her. Paperwork and DNR form in patient chart.   Daughter and nurse aware.  Magdalen Spatz RN

## 2020-09-20 NOTE — TOC Progression Note (Signed)
Transition of Care [facility]) - Progression Note    Patient Details  Name: Madeline Burke MRN: [id number] Date of Birth: 1933/07/31  Transition of Care [facility]) CM/SW [place], [place] Phone Number: [phone] 09/20/2020, 10:20 AM  Clinical Narrative:     CSW was alerted that family has decided on residential hospice for patient. CSW reached out to patient's daughter [name] and confimred that they prefer patient to stay in [place] at [facility]. CSW reached out to the [place] and made the referral.Expected Discharge Plan: [place] Barriers to Discharge: No Barriers Identified  Expected Discharge Plan and Services Expected Discharge Plan: [place]   Discharge Planning Services: CM Consult Post Acute Care Choice: [place] arrangements for the past 2 months: Single Family Home                           HH Arranged: PT [facility] Agency: [facility] Date [place]: 09/13/20 Time [place]: [place] Representative spoke with at [place]: Amy   Social Determinants of Health ([place]) Interventions    Readmission Risk Interventions No flowsheet data found.

## 2020-09-20 NOTE — Progress Notes (Signed)
Daily Progress Note   Patient Name: Madeline Burke       Date: 09/20/2020 DOB: [birth year]  Age: 87 y.o. MRN#: [id number] Attending Physician: Barb Merino, MD Primary Care Physician: Leeroy Cha, MD Admit Date: 09/10/2020  Reason for Consultation/Follow-up: Establishing goals of care  Subjective: Events of last night noted. Patient with difficulty swallowing po meds.  She is not eating. She enjoys cold ice water.  This morning she is awake, pleasant.  Daughter Madeline Burke at bedside, spoke with daughter Madeline Burke on speakerphone.  They have concerns about taking patient home and being able to manage her symptoms after last night's episode. We discussed residential Hospice and they are in agreement.   ROS  Length of Stay: 9  Current Medications: Scheduled Meds:  . cycloSPORINE  1 drop Both Eyes BID  . feeding supplement  237 mL Oral TID BM  . fentaNYL  1 patch Transdermal Q72H  . mouth rinse  15 mL Mouth Rinse BID  . pantoprazole  40 mg Oral Daily  . sodium chloride flush  3 mL Intravenous Q12H    Continuous Infusions:   PRN Meds: acetaminophen **OR** acetaminophen, glycopyrrolate **OR** glycopyrrolate **OR** glycopyrrolate, HYDROmorphone (DILAUDID) injection, ipratropium-albuterol, LORazepam, ondansetron **OR** ondansetron (ZOFRAN) IV, oxyCODONE, polyethylene glycol  Physical Exam Vitals and nursing note reviewed.  Constitutional:      General: She is not in acute distress. Psychiatric:     Comments: Pleasantly confused             Vital Signs: BP (!) 101/54 (BP Location: Right Arm)   Pulse 82   Temp 98.3 F (36.8 C) (Axillary)   Resp 18   Ht 5' 2"  (1.575 m)   Wt 56.7 kg   SpO2 96%   BMI 22.86 kg/m  SpO2: SpO2: 96 % O2 Device: O2 Device: Room Air O2 Flow  Rate: O2 Flow Rate (L/min): 6 L/min  Intake/output summary:   Intake/Output Summary (Last 24 hours) at 09/20/2020 1029 Last data filed at 09/20/2020 6950 Gross per 24 hour  Intake --  Output 400 ml  Net -400 ml   LBM: Last BM Date: 09/13/20 Baseline Weight: Weight: 56.7 kg Most recent weight: Weight: 56.7 kg       Palliative Assessment/Data: PPS: 20%     Patient Active Problem List   Diagnosis Date Noted  .  Multiple myeloma ([place]) 09/12/2020  . ARF (acute renal failure) ([place]) 09/11/2020  . AKI (acute kidney injury) ([place]) 09/10/2020  . Abdominal pain, epigastric 09/10/2020  . HTN (hypertension) 09/10/2020  . Diabetes ([place]) 09/10/2020  . Anemia 09/10/2020  . Angio-edema 02/08/2018    Palliative Care Assessment & Plan   Patient Profile: 87 y.o.femalewith past medical history of dementia, DM 2, HLD, hypertension, recent diagnosis of multiple myeloma with findings of plasma cell neoplasm on bone marrow biopsy-started on Velcade in this admission 3/26-admitted on 3/23/2022with complaint of abdominal pain and syncope.Shewas started on PPI.Admission has been complicated due to developing a large hematoma from a brachial artery catheterization injury,developed compartment syndrome and required emergent fasciotomy. Further work-up revealed metabolic acidosis, acute on chronic kidney injury that has worsened during admission due to blood loss from brachial artery injury, surgery, fasciotomy.EEG completed shows diffuse cerebral dysfunction.Palliative medicine consulted for "goals of care,high risk 71-monthmortality".  Assessment/Recommendations/Plan  Full comfort care [facility] f[facility] patch 168m/hr for pain control, continue IV hydromorpone prn for severe pain, add oxycodone concentrated solution SL for breakthrough pain Avoid morphine due to renal function- will not be effective and side effects are increased  Goals of Care and  Additional Recommendations: Limitations on Scope of Treatment: Full Comfort Care  Code Status: DNR  Prognosis:  < 2 weeks due to rapidly progressing renal failure, failure to thrive, in the setting of multiple myeloma, dementia, and plan for full comfort measures only  Discharge Planning: Hospice facility  Care plan was discussed with patient's daughters Madeline Burke- left message for Madeline AntiguaThank you for allowing the Palliative Medicine Team to assist in the care of this patient.   Total time: 62 minutes  Greater than 50%  of this time was spent counseling and coordinating care related to the above assessment and plan.  KaMariana KaufmanAGNP-C Palliative Medicine   Please contact Palliative Medicine Team phone at [phone] questions and concerns.

## 2020-09-20 NOTE — Discharge Summary (Signed)
Physician Discharge Summary  Madeline Burke WGN:[id number] DOB: [birth year] DOA: 09/10/2020  PCP: Leeroy Cha, MD  Admit date: 09/10/2020 Discharge date: 09/20/2020  Admitted From: Home Disposition: Inpatient hospice  Recommendations for Outpatient Follow-up:  1. As per hospice plan  Discharge Condition: Serious CODE STATUS: DNR/comfort care Diet recommendation: Regular diet.  Aspiration precautions.  Discharge summary:  87 year old female with history of hypertension, hyperlipidemia, type 2 diabetes, angioedema, recently diagnosed multiple myeloma who presented to the hospital with intermittent abdominal pain, weakness, debility and episodes of syncope.  She was found to have acute kidney injury, metabolic acidosis.  Subsequently, also developed compartment syndrome on her left arm after an arterial puncture for a blood gas.  She had complicated hospital course.  Patient developed ongoing pain, confusion and delirium as well as failure to thrive.  Ultimately converted to comfort care measures and recommended hospice.   Left forearm compartment syndrome due to arterial injury, status post exploration, wound VAC and closure with persistent severe pain and decreased mobility. 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  Worsening kidney functions. Anemia of acute blood loss, anemia of chronic disease.  5 units PRBC transfusion. Advance physical debility, confusion and delirium and disorientation.  Failure to thrive.  No oral intake.  Intolerance to myeloma therapy including Velcade and steroids.  Plan: Patient not doing clinical recovery despite optimal medical therapy, a comfort care approach was recommended and patient was started on comfort care measures. She will be transferred to inpatient hospice today for end-of-life care. Not taking any reliable medication by mouth, will be medicated for comfort before transfer. Discontinue all medications  including antihypertensives. Stable to transfer to hospice level of care.   Discharge Diagnoses:  Principal Problem:   AKI (acute kidney injury) ([place]) Active Problems:   Abdominal pain, epigastric   HTN (hypertension)   Diabetes ([place])   Anemia   ARF (acute renal failure) [facility])    Discharge Instructions  Discharge Instructions    Diet general   Complete by: As directed    Increase activity slowly   Complete by: As directed    No wound care   Complete by: As directed      Allergies as of 09/20/2020      Reactions   Aspirin Hives   Bee Venom Hives   Famotidine Other (See Comments)   "mouth irritated" Other reaction(s): Other (See Comments) "mouth irritated"   Fish Allergy Hives   Fish-derived Products Other (See Comments)   Lisinopril Cough   Other reaction(s): Cough (ALLERGY/intolerance)   Other Hives   Penicillamine Hives   Penicillins Hives   Shellfish Allergy Hives   Sulfa Antibiotics Hives   Sulfasalazine Hives   Flagyl [metronidazole] Rash      Medication List    STOP taking these medications   albuterol (2.5 MG/3ML) 0.083% nebulizer solution Commonly known as: PROVENTIL   albuterol 108 (90 Base) MCG/ACT inhaler Commonly known as: VENTOLIN HFA   amLODipine 2.5 MG tablet Commonly known as: NORVASC   carvedilol 25 MG tablet Commonly known as: COREG   cycloSPORINE 0.05 % ophthalmic emulsion Commonly known as: RESTASIS   EPINEPHrine 0.3 mg/0.3 mL Soaj injection Commonly known as: EPI-PEN   fexofenadine 180 MG tablet Commonly known as: ALLEGRA   mirtazapine 15 MG tablet Commonly known as: REMERON   multivitamin with minerals Tabs tablet   Prolia 60 MG/ML Sosy injection Generic drug: denosumab   rosuvastatin 10 MG tablet Commonly known as: CRESTOR   triamcinolone 0.1 %  Commonly known as: KENALOG   Vitamin D3 50 MCG (2000 UT) Tabs       Follow-up Information    [facility].   Specialty: Home Health  Services Why: Agency will provide home health physical therapy. Contact information: [place] [id number] [phone]              Allergies  Allergen Reactions  . Aspirin Hives  . Bee Venom Hives  . Famotidine Other (See Comments)    "mouth irritated" Other reaction(s): Other (See Comments) "mouth irritated"  . Fish Allergy Hives  . Fish-Derived Products Other (See Comments)  . Lisinopril Cough    Other reaction(s): Cough (ALLERGY/intolerance)  . Other Hives  . Penicillamine Hives  . Penicillins Hives  . Shellfish Allergy Hives  . Sulfa Antibiotics Hives  . Sulfasalazine Hives  . Flagyl [Metronidazole] Rash    Consultations:  Oncology  Palliative  Nephrology   Procedures/Studies: DG Chest 1 View  Result Date: 09/11/2020 CLINICAL DATA:  History of pulmonary embolism. EXAM: CHEST  1 VIEW COMPARISON:  July 14, 2016. FINDINGS: The heart size and mediastinal contours are within normal limits. Both lungs are clear. The visualized skeletal structures are unremarkable. IMPRESSION: No active disease. Aortic Atherosclerosis (ICD10-I70.0). Electronically Signed   By: Marijo Conception M.D.   On: 09/11/2020 08:36   CT Head Wo Contrast  Result Date: 09/11/2020 CLINICAL DATA:  Altered mental status, sudden onset confusion EXAM: CT HEAD WITHOUT CONTRAST TECHNIQUE: Contiguous axial images were obtained from the base of the skull through the vertex without intravenous contrast. COMPARISON:  06/20/2020 FINDINGS: Brain: No evidence of acute infarction, hemorrhage, hydrocephalus, extra-axial collection, visible mass lesion or mass effect. Diffuse prominence of the ventricles, cisterns and sulci compatible with parenchymal volume loss and ex vacuo dilatation unchanged from priors. Patchy areas of white matter hypoattenuation are most compatible with moderate chronic microvascular angiopathy. Vascular: Atherosclerotic calcification of the carotid siphons and  intradural vertebral arteries. No hyperdense vessel. Skull: No calvarial fracture or suspicious osseous lesion. No scalp swelling or hematoma. Sinuses/Orbits: Paranasal sinuses and mastoid air cells are predominantly clear. Orbital structures are unremarkable aside from prior lens extractions. Other: None IMPRESSION: 1. No acute intracranial findings. If there is persisting concern for acute infarction, MRI is more sensitive and specific for early features of ischemia. 2. Stable parenchymal volume loss and chronic microvascular angiopathy. Electronically Signed   By: Lovena Le M.D.   On: 09/11/2020 03:08   MR BRAIN WO CONTRAST  Result Date: 09/14/2020 CLINICAL DATA:  Seizure EXAM: MRI HEAD WITHOUT CONTRAST TECHNIQUE: Multiplanar, multiecho pulse sequences of the brain and surrounding structures were obtained without intravenous contrast. COMPARISON:  None. FINDINGS: Motion artifact is present. Brain: There is no acute infarction or intracranial hemorrhage. There is no intracranial mass, mass effect, or edema. There is no hydrocephalus or extra-axial fluid collection. Prominence of the ventricles and sulci reflects generalized parenchymal volume loss. Foci of T2 hyperintensity in the supratentorial and pontine white matter are nonspecific but probably reflect moderate chronic microvascular ischemic changes. Vascular: Major vessel flow voids at the skull base are preserved. Skull and upper cervical spine: Normal marrow signal is preserved. Sinuses/Orbits: Paranasal sinuses are aerated. Bilateral lens replacements. Other: Sella is partially empty.  Mastoid air cells are clear. IMPRESSION: No evidence of recent infarction, hemorrhage, or mass. Moderate chronic microvascular ischemic changes. Electronically Signed   By: Macy Mis M.D.   On: 09/14/2020 18:25   NM Pulmonary Perfusion  Result Date: 09/11/2020 CLINICAL DATA:  Shortness of breath, elevated creatinine, elevated D-dimer, history hypertension,  diabetes mellitus EXAM: NUCLEAR MEDICINE PERFUSION LUNG SCAN TECHNIQUE: Perfusion images were obtained in multiple projections after intravenous injection of radiopharmaceutical. Ventilation scans intentionally deferred if perfusion scan and chest x-ray adequate for interpretation during COVID 19 epidemic. RADIOPHARMACEUTICALS:  3.9 mCi Tc-75m MAA IV COMPARISON:  Chest radiograph 09/11/2020 FINDINGS: Normal perfusion lung scan. Artifacts from arms on lateral view. No perfusion defects identified. IMPRESSION: Normal perfusion lung scan. Electronically Signed   By: Lavonia Dana M.D.   On: 09/11/2020 11:55   US RENAL  Result Date: 09/12/2020 CLINICAL DATA:  Acute renal failure EXAM: RENAL / URINARY TRACT ULTRASOUND COMPLETE COMPARISON:  CT 08/01/2020 FINDINGS: Right Kidney: Renal measurements: 9.5 x 4.9 x 4.7 cm = volume: 114 mL. Slightly increased echogenicity of the renal parenchyma. No hydronephrosis. Left Kidney: Renal measurements: 11.1 x 4.3 x 4.4 cm = volume: 108 mL. Slightly increased echogenicity of the renal parenchyma. No hydronephrosis. Bladder: Recent voiding.  No visible urine in the bladder presently. Other: Arterial and venous flow present to each kidney. IMPRESSION: No obstruction. Kidneys within normal limits in size for age. Echogenic renal parenchyma consistent with renal parenchymal disease. Electronically Signed   By: Nelson Chimes M.D.   On: 09/12/2020 12:57   US Aorta  Result Date: 09/10/2020 CLINICAL DATA:  87 year old female with concern for aortic Disease. EXAM: ULTRASOUND OF ABDOMINAL AORTA TECHNIQUE: Ultrasound examination of the abdominal aorta and proximal common iliac arteries was performed to evaluate for aneurysm. Additional color and Doppler images of the distal aorta were obtained to document patency. COMPARISON:  CT abdomen pelvis dated 08/01/2020. FINDINGS: Evaluation is limited due to overlying bowel gas and inability of patient to cooperate with exam. There is  atherosclerotic calcification of the visualized abdominal aorta. Abdominal aortic measurements as follows: Proximal:  2.9 x 3.0 cm Mid:  1.9 x 1.7 cm Distal: Not visualized due to bowel gas. Patent: Not evaluated. Right common iliac artery: Not visualized due to bowel gas. Left common iliac artery: Not visualized due to bowel gas. IMPRESSION: 1. Abdominal aortic aneurysm measuring 3 cm proximally. Recommend follow-up ultrasound every 3 years. This recommendation follows ACR consensus guidelines: White Paper of the ACR Incidental Findings Committee II on Vascular Findings. J Am Coll Radiol 2013; 10:789-794. 2.  Aortic Atherosclerosis (ICD10-I70.0). Electronically Signed   By: Anner Crete M.D.   On: 09/10/2020 21:45   CT Biopsy  Result Date: 09/08/2020 INDICATION: ELEVATED SERUM IMMUNOGLOBULIN, CONCERN FOR MYELOMA EXAM: CT GUIDED RIGHT ILIAC BONE MARROW ASPIRATION AND CORE BIOPSY Date:  09/08/2020 09/08/2020 10:04 am Radiologist:  M. Daryll Brod, MD Guidance:  CT FLUOROSCOPY TIME:  Fluoroscopy Time: None. MEDICATIONS: 1% lidocaine local ANESTHESIA/SEDATION: 1.0 mg IV Versed; 50 mcg IV Fentanyl Moderate Sedation Time:  10 minutes The patient was continuously monitored during the procedure by the interventional radiology nurse under my direct supervision. CONTRAST:  None. COMPLICATIONS: None immediate PROCEDURE: Informed consent was obtained from the patient following explanation of the procedure, risks, benefits and alternatives. The patient understands, agrees and consents for the procedure. All questions were addressed. A time out was performed. The patient was positioned prone and non-contrast localization CT was performed of the pelvis to demonstrate the iliac marrow spaces. Maximal barrier sterile technique utilized including caps, mask, sterile gowns, sterile gloves, large sterile drape, hand hygiene, and Betadine prep. Under sterile conditions and local anesthesia, an 11 gauge coaxial bone biopsy needle  was advanced into  the right iliac marrow space. Needle position was confirmed with CT imaging. Initially, bone marrow aspiration was performed. Next, the 11 gauge outer cannula was utilized to obtain a right iliac bone marrow core biopsy. Needle was removed. Hemostasis was obtained with compression. The patient tolerated the procedure well. Samples were prepared with the cytotechnologist. No immediate complications. IMPRESSION: CT guided right iliac bone marrow aspiration and core biopsy. Electronically Signed   By: Jerilynn Mages.  Shick M.D.   On: 09/08/2020 10:36   DG CHEST PORT 1 VIEW  Result Date: 09/13/2020 CLINICAL DATA:  Short of breath EXAM: PORTABLE CHEST 1 VIEW COMPARISON:  09/11/2020 FINDINGS: Single frontal view of the chest demonstrates a stable cardiac silhouette. No airspace disease, effusion, or pneumothorax. No acute bony abnormalities. IMPRESSION: 1. Stable chest, no acute process. Electronically Signed   By: Randa Ngo M.D.   On: 09/13/2020 22:40   DG Bone Survey Met  Result Date: 09/04/2020 CLINICAL DATA:  Possible multiple myeloma. EXAM: METASTATIC BONE SURVEY COMPARISON:  August 01, 2020. FINDINGS: Multiple old compression fractures are noted in the thoracic and lumbar spine. Degenerative changes are seen involving both sacroiliac joints. Probable bilateral carotid artery calcifications are noted. No definite lytic or sclerotic lesions are noted. IMPRESSION: 1. Multiple old compression fractures are noted in the thoracic and lumbar spine. No definite lytic or sclerotic lesions are noted. 2. Probable bilateral carotid artery calcifications. Carotid ultrasound is recommended for further evaluation. Electronically Signed   By: Marijo Conception M.D.   On: 09/04/2020 20:07   CT BONE MARROW BIOPSY & ASPIRATION  Result Date: 09/08/2020 INDICATION: ELEVATED SERUM IMMUNOGLOBULIN, CONCERN FOR MYELOMA EXAM: CT GUIDED RIGHT ILIAC BONE MARROW ASPIRATION AND CORE BIOPSY Date:  09/08/2020 09/08/2020 10:04  am Radiologist:  M. Daryll Brod, MD Guidance:  CT FLUOROSCOPY TIME:  Fluoroscopy Time: None. MEDICATIONS: 1% lidocaine local ANESTHESIA/SEDATION: 1.0 mg IV Versed; 50 mcg IV Fentanyl Moderate Sedation Time:  10 minutes The patient was continuously monitored during the procedure by the interventional radiology nurse under my direct supervision. CONTRAST:  None. COMPLICATIONS: None immediate PROCEDURE: Informed consent was obtained from the patient following explanation of the procedure, risks, benefits and alternatives. The patient understands, agrees and consents for the procedure. All questions were addressed. A time out was performed. The patient was positioned prone and non-contrast localization CT was performed of the pelvis to demonstrate the iliac marrow spaces. Maximal barrier sterile technique utilized including caps, mask, sterile gowns, sterile gloves, large sterile drape, hand hygiene, and Betadine prep. Under sterile conditions and local anesthesia, an 11 gauge coaxial bone biopsy needle was advanced into the right iliac marrow space. Needle position was confirmed with CT imaging. Initially, bone marrow aspiration was performed. Next, the 11 gauge outer cannula was utilized to obtain a right iliac bone marrow core biopsy. Needle was removed. Hemostasis was obtained with compression. The patient tolerated the procedure well. Samples were prepared with the cytotechnologist. No immediate complications. IMPRESSION: CT guided right iliac bone marrow aspiration and core biopsy. Electronically Signed   By: Jerilynn Mages.  Shick M.D.   On: 09/08/2020 10:36   VAS [place] UPPER EXTREMITY ARTERIAL DUPLEX  Result Date: 09/16/2020 UPPER EXTREMITY DUPLEX STUDY Indications: Patient complains of Swelling - R/O compartment syndrome.  Comparison Study: No previous exams Performing Technologist: Rogelia Rohrer  Examination Guidelines: A complete evaluation includes B-mode imaging, spectral Doppler, color Doppler, and power Doppler as  needed of all accessible portions of each vessel. Bilateral testing is considered an integral part of a  complete examination. Limited examinations for reoccurring indications may be performed as noted.  Left Doppler Findings: +--------------+----------+----------+--------+-------------------------+ Site          PSV (cm/s)Waveform  StenosisComments                  +--------------+----------+----------+--------+-------------------------+ Subclavian Mid92        monophasic        Diastolic retrograde flow +--------------+----------+----------+--------+-------------------------+ Axillary      87        monophasic        Diastolic retrograde flow +--------------+----------+----------+--------+-------------------------+ Brachial Mid  137       monophasic        Diastolic retrograde flow +--------------+----------+----------+--------+-------------------------+ Radial Prox   102       monophasic                                  +--------------+----------+----------+--------+-------------------------+ Radial Mid    58        monophasic                                  +--------------+----------+----------+--------+-------------------------+ Radial Dist   48        monophasic                                  +--------------+----------+----------+--------+-------------------------+ Ulnar Prox    72        monophasic                                  +--------------+----------+----------+--------+-------------------------+ Ulnar Mid     51        monophasic                                  +--------------+----------+----------+--------+-------------------------+ Ulnar Dist    53        monophasic                                  +--------------+----------+----------+--------+-------------------------+   Summary:  Left: Arterial flow is seen down to the wrist however, diastolic       retrograde flow seen in subclavian, axillary, and brachial       arteries in  combination with venous compression coincide with       early signs of compartment syndrome. *See table(s) above for measurements and observations. Electronically signed by Ruta Hinds MD on 09/16/2020 at 3:14:44 PM.    Final    ECHOCARDIOGRAM COMPLETE  Result Date: 09/14/2020    ECHOCARDIOGRAM REPORT   Patient Name:   ANALIYA PORCO Date of Exam: 09/14/2020 Medical Rec #:  [id number]      Height:       62.0 in Accession #:    [id number]     Weight:       125.0 lb Date of Birth:  1933-07-04      BSA:          1.566 m Patient Age:    75 years       BP:           107/53 mmHg Patient Gender: F  HR:           60 bpm. Exam Location:  Inpatient Procedure: Cardiac Doppler, Color Doppler and Strain Analysis Indications:    Syncope R55  History:        Patient has no prior history of Echocardiogram examinations.                 Risk Factors:Hypertension, Diabetes and Dyslipidemia. Multiple                 myeloma with renal failure.  Sonographer:    [name] RDCS Referring Phys: [place]  1. Left ventricular ejection fraction, by estimation, is 60 to 65%. Left ventricular ejection fraction by 3D volume is 60 %. The left ventricle has normal function. The left ventricle has no regional wall motion abnormalities. Left ventricular diastolic  parameters were normal. The average left ventricular global longitudinal strain is -20.6 %. The global longitudinal strain is normal.  2. Right ventricular systolic function is normal. The right ventricular size is normal. There is normal pulmonary artery systolic pressure. The estimated right ventricular systolic pressure is 10.1 mmHg.  3. The mitral valve is grossly normal. Trivial mitral valve regurgitation. No evidence of mitral stenosis.  4. The aortic valve is tricuspid. Aortic valve regurgitation is not visualized. No aortic stenosis is present.  5. The inferior vena cava is dilated in size with >50% respiratory variability, suggesting  right atrial pressure of 8 mmHg. Conclusion(s)/Recommendation(s): Normal biventricular function without evidence of hemodynamically significant valvular heart disease. FINDINGS  Left Ventricle: Left ventricular ejection fraction, by estimation, is 60 to 65%. Left ventricular ejection fraction by 3D volume is 60 %. The left ventricle has normal function. The left ventricle has no regional wall motion abnormalities. The average left ventricular global longitudinal strain is -20.6 %. The global longitudinal strain is normal. The left ventricular internal cavity size was normal in size. There is no left ventricular hypertrophy. Left ventricular diastolic parameters were normal. Right Ventricle: The right ventricular size is normal. No increase in right ventricular wall thickness. Right ventricular systolic function is normal. There is normal pulmonary artery systolic pressure. The tricuspid regurgitant velocity is 2.62 m/s, and  with an assumed right atrial pressure of 8 mmHg, the estimated right ventricular systolic pressure is 75.1 mmHg. Left Atrium: Left atrial size was normal in size. Right Atrium: Right atrial size was normal in size. Pericardium: Trivial pericardial effusion is present. Mitral Valve: The mitral valve is grossly normal. Trivial mitral valve regurgitation. No evidence of mitral valve stenosis. Tricuspid Valve: The tricuspid valve is grossly normal. Tricuspid valve regurgitation is mild . No evidence of tricuspid stenosis. Aortic Valve: The aortic valve is tricuspid. Aortic valve regurgitation is not visualized. No aortic stenosis is present. Pulmonic Valve: The pulmonic valve was grossly normal. Pulmonic valve regurgitation is trivial. No evidence of pulmonic stenosis. Aorta: The aortic root and ascending aorta are structurally normal, with no evidence of dilitation. Venous: The right upper pulmonary vein is normal. The inferior vena cava is dilated in size with greater than 50% respiratory  variability, suggesting right atrial pressure of 8 mmHg. IAS/Shunts: The atrial septum is grossly normal.  LEFT VENTRICLE PLAX 2D LVIDd:         4.20 cm         Diastology LVIDs:         2.60 cm         LV e' medial:    7.83 cm/s LV PW:  1.10 cm         LV E/e' medial:  10.2 LV IVS:        1.20 cm         LV e' lateral:   9.57 cm/s LVOT diam:     1.90 cm         LV E/e' lateral: 8.4 LV SV:         72 LV SV Index:   46              2D LVOT Area:     2.84 cm        Longitudinal                                Strain                                2D Strain GLS  -20.6 %                                Avg:                                 3D Volume EF                                LV 3D EF:    Left                                             ventricular                                             ejection                                             fraction by                                             3D volume                                             is 60 %.                                 3D Volume EF:                                3D EF:        60 %  LV EDV:       72 ml                                LV ESV:       29 ml                                LV SV:        43 ml RIGHT VENTRICLE RV S prime:     14.10 cm/s TAPSE (M-mode): 2.1 cm LEFT ATRIUM             Index       RIGHT ATRIUM           Index LA diam:        3.40 cm 2.17 cm/m  RA Area:     17.70 cm LA Vol (A2C):   33.6 ml 21.46 ml/m RA Volume:   47.00 ml  30.02 ml/m LA Vol (A4C):   57.2 ml 36.54 ml/m LA Biplane Vol: 48.3 ml 30.85 ml/m  AORTIC VALVE LVOT Vmax:   112.00 cm/s LVOT Vmean:  78.600 cm/s LVOT VTI:    0.255 m  AORTA Ao Root diam: 2.80 cm MITRAL VALVE               TRICUSPID VALVE MV Area (PHT): 4.15 cm    TR Peak grad:   27.5 mmHg MV Decel Time: 183 msec    TR Vmax:        262.00 cm/s MV E velocity: 80.10 cm/s MV A velocity: 82.70 cm/s  SHUNTS MV E/A ratio:  0.97        Systemic VTI:  0.26 m                             Systemic Diam: 1.90 cm Eleonore Chiquito MD Electronically signed by Eleonore Chiquito MD Signature Date/Time: 09/14/2020/3:10:55 PM    Final    VAS [place] UPPER EXTREMITY VENOUS DUPLEX  Result Date: 09/16/2020 UPPER VENOUS STUDY  Indications: Pain, and Swelling Limitations: Body habitus and patient cooperation. Performing Technologist: Antonieta Pert RDMS, RVT  Examination Guidelines: A complete evaluation includes B-mode imaging, spectral Doppler, color Doppler, and power Doppler as needed of all accessible portions of each vessel. Bilateral testing is considered an integral part of a complete examination. Limited examinations for reoccurring indications may be performed as noted.  Right Findings: +----------+------------+---------+-----------+----------+--------------------+ RIGHT     CompressiblePhasicitySpontaneousProperties      Summary        +----------+------------+---------+-----------+----------+--------------------+ IJV           Full       Yes       Yes                                   +----------+------------+---------+-----------+----------+--------------------+ Subclavian               Yes       Yes              distal iv cath noted +----------+------------+---------+-----------+----------+--------------------+ Axillary      Full       Yes       Yes                                   +----------+------------+---------+-----------+----------+--------------------+  Brachial      Full                                                       +----------+------------+---------+-----------+----------+--------------------+ Radial        Full                                                       +----------+------------+---------+-----------+----------+--------------------+ Ulnar                                                  Not visualized    +----------+------------+---------+-----------+----------+--------------------+ Cephalic      Full                                                        +----------+------------+---------+-----------+----------+--------------------+ Basilic                                                Not visualized    +----------+------------+---------+-----------+----------+--------------------+ intersitial fluid noted in right arm  Left Findings: +----------+------------+---------+-----------+----------+-------+ LEFT      CompressiblePhasicitySpontaneousPropertiesSummary +----------+------------+---------+-----------+----------+-------+ Subclavian               Yes       Yes                      +----------+------------+---------+-----------+----------+-------+  Summary:  Right: No evidence of deep vein thrombosis in the upper extremity. No evidence of superficial vein thrombosis in the upper extremity.  Left: No evidence of thrombosis in the subclavian.  *See table(s) above for measurements and observations.  Diagnosing physician: Ruta Hinds MD Electronically signed by Ruta Hinds MD on 09/16/2020 at 3:07:46 PM.    Final    (Echo, Carotid, EGD, Colonoscopy, ERCP)    Subjective: Patient seen and examined in the morning rounds.  Overnight events noted.  She had episode of choking.  When I examined her in the morning, she was more awake and alert, gave her ice water with a straw and she took it without difficulty.  She has no appetite and declines to eat any other food.  She only likes some ice water.  Was pleasant and disorientated.  Severe pain on the left arm and unable to move. On daughter at the bedside, another daughter on the phone.  Discharge Exam: Vitals:   09/20/20 0638 09/20/20 0805  BP: 120/64 (!) 101/54  Pulse:  82  Resp:  18  Temp:  98.3 F (36.8 C)  SpO2:  96%   Vitals:   09/19/20 1640 09/20/20 0635 09/20/20 0638 09/20/20 0805  BP: (!) 116/48  120/64 (!) 101/54  Pulse:    82  Resp: $Remo'14 19  18  'GfxuY$ Temp: 98.1 F (36.7 C) 98.1  F (36.7 C)  98.3 F (36.8 C)  TempSrc: Oral  Oral  Axillary  SpO2: 94% 97%  96%  Weight:      Height:        General: Sick looking.  Frail and debilitated.  On room air. Pleasant at times, impulsive.  Disoriented. Cardiovascular: No added sounds. Respiratory: CTA bilaterally, no wheezing, no rhonchi Abdominal: Soft, NT, ND, bowel sounds + Extremities:  Left upper extremity with postop dressing, diffuse swelling present.  Blistering of the dorsum of the hand present.    The results of significant diagnostics from this hospitalization (including imaging, microbiology, ancillary and laboratory) are listed below for reference.     Microbiology: Recent Results (from the past 240 hour(s))  SARS CORONAVIRUS 2 (TAT 6-24 HRS) Nasopharyngeal Nasopharyngeal Swab     Status: None   Collection Time: 09/10/20 10:42 PM   Specimen: Nasopharyngeal Swab  Result Value Ref Range Status   SARS Coronavirus 2 NEGATIVE NEGATIVE Final    Comment: (NOTE) SARS-CoV-2 target nucleic acids are NOT DETECTED.  The SARS-CoV-2 RNA is generally detectable in upper and lower respiratory specimens during the acute phase of infection. Negative results do not preclude SARS-CoV-2 infection, do not rule out co-infections with other pathogens, and should not be used as the sole basis for treatment or other patient management decisions. Negative results must be combined with clinical observations, patient history, and epidemiological information. The expected result is Negative.  Fact Sheet for Patients: SugarRoll.be  Fact Sheet for Healthcare Providers: https://www.woods-mathews.com/  This test is not yet approved or cleared by the [place] FDA and  has been authorized for detection and/or diagnosis of SARS-CoV-2 by FDA under an Emergency Use Authorization (EUA). This EUA will remain  in effect (meaning this test can be used) for the duration of the COVID-19 declaration under Se ction 564(b)(1) of the Act, 21  U.S.C. section 360bbb-3(b)(1), unless the authorization is terminated or revoked sooner.  Performed at [facility], [place] [address], [place], [place] [address]      Labs: BNP (last 3 results) No results for input(s): BNP in the last 8760 hours. Basic Metabolic Panel: Recent Labs  Lab 09/15/20 0443 09/15/20 1027 09/15/20 1229 09/16/20 0530 09/17/20 1033 09/18/20 0251 09/19/20 0145  NA 140 142 143 139 137 137 139  K 4.9 5.4* 5.1 5.4* 4.9 4.6 5.1  CL 114* 113* 113* 112* 109 108 109  CO2 20* 20*  --  18* 21* 21* 19*  GLUCOSE 218* 224* 198* 200* 172* 107* 119*  BUN 32* 31* 33* 34* 46* 48* 47*  CREATININE 1.88* 2.13* 2.30* 2.33* 3.53* 3.81* 4.09*  CALCIUM 7.6* 7.7*  --  7.1* 6.7* 6.5* 6.5*  MG 1.8  --   --   --   --   --   --   PHOS  --   --   --   --   --   --  6.8*   Liver Function Tests: Recent Labs  Lab 09/13/20 2325 09/14/20 0516 09/15/20 0443 09/17/20 1033 09/19/20 0145  AST 91* 91* 59* 22  --   ALT 38 33 32 18  --   ALKPHOS 52 55 56 44  --   BILITOT 0.6 0.6 0.4 0.7  --   PROT 6.4* 6.4* 5.6* 4.9*  --   ALBUMIN 3.8 3.7 3.1* 2.5* 2.2*   No results for input(s): LIPASE, AMYLASE in the last 168 hours. No results for input(s): AMMONIA in the last 168 hours.  CBC: Recent Labs  Lab 09/13/20 2325 09/14/20 0516 09/15/20 1027 09/15/20 1229 09/16/20 0530 09/17/20 1033 09/18/20 0251 09/19/20 0145  WBC 8.4   < > 21.7*  --  13.2* 13.7* 7.9 11.9*  NEUTROABS 5.0  --   --   --   --  2.7  --   --   HGB 7.0*   < > 5.6* 5.1* 9.4* 5.2* 10.1* 8.5*  HCT 22.1*   < > 17.8* 15.0* 26.9* 15.4* 28.9* 25.0*  MCV 88.8   < > 90.4  --  84.9 89.0 88.1 89.9  PLT 110*   < > 101*  --  66* 63* 51* 67*   < > = values in this interval not displayed.   Cardiac Enzymes: No results for input(s): CKTOTAL, CKMB, CKMBINDEX, TROPONINI in the last 168 hours. BNP: Invalid input(s): POCBNP CBG: Recent Labs  Lab 09/18/20 1657 09/18/20 2117 09/19/20 0607 09/19/20 1203 09/19/20 1642   GLUCAP 226* 147* 124* 148* 182*   D-Dimer No results for input(s): DDIMER in the last 72 hours. Hgb A1c No results for input(s): HGBA1C in the last 72 hours. Lipid Profile No results for input(s): CHOL, HDL, LDLCALC, TRIG, CHOLHDL, LDLDIRECT in the last 72 hours. Thyroid function studies No results for input(s): TSH, T4TOTAL, T3FREE, THYROIDAB in the last 72 hours.  Invalid input(s): FREET3 Anemia work up No results for input(s): VITAMINB12, FOLATE, FERRITIN, TIBC, IRON, RETICCTPCT in the last 72 hours. Urinalysis    Component Value Date/Time   COLORURINE STRAW (A) 09/10/2020 2230   APPEARANCEUR CLEAR 09/10/2020 2230   LABSPEC 1.012 09/10/2020 2230   PHURINE 6.0 09/10/2020 2230   GLUCOSEU NEGATIVE 09/10/2020 2230   HGBUR NEGATIVE 09/10/2020 2230   BILIRUBINUR NEGATIVE 09/10/2020 2230   KETONESUR NEGATIVE 09/10/2020 2230   PROTEINUR 100 (A) 09/10/2020 2230   UROBILINOGEN 0.2 09/25/2007 1130   NITRITE NEGATIVE 09/10/2020 2230   LEUKOCYTESUR NEGATIVE 09/10/2020 2230   Sepsis Labs Invalid input(s): PROCALCITONIN,  WBC,  LACTICIDVEN Microbiology Recent Results (from the past 240 hour(s))  SARS CORONAVIRUS 2 (TAT 6-24 HRS) Nasopharyngeal Nasopharyngeal Swab     Status: None   Collection Time: 09/10/20 10:42 PM   Specimen: Nasopharyngeal Swab  Result Value Ref Range Status   SARS Coronavirus 2 NEGATIVE NEGATIVE Final    Comment: (NOTE) SARS-CoV-2 target nucleic acids are NOT DETECTED.  The SARS-CoV-2 RNA is generally detectable in upper and lower respiratory specimens during the acute phase of infection. Negative results do not preclude SARS-CoV-2 infection, do not rule out co-infections with other pathogens, and should not be used as the sole basis for treatment or other patient management decisions. Negative results must be combined with clinical observations, patient history, and epidemiological information. The expected result is Negative.  Fact Sheet for  Patients: SugarRoll.be  Fact Sheet for Healthcare Providers: https://www.woods-mathews.com/  This test is not yet approved or cleared by the [place] FDA and  has been authorized for detection and/or diagnosis of SARS-CoV-2 by FDA under an Emergency Use Authorization (EUA). This EUA will remain  in effect (meaning this test can be used) for the duration of the COVID-19 declaration under Se ction 564(b)(1) of the Act, 21 U.S.C. section 360bbb-3(b)(1), unless the authorization is terminated or revoked sooner.  Performed at [facility], [place] [address], [place],  [address]      Time coordinating discharge:  35 minutes  SIGNED:   Barb Merino, MD  [facility] 09/20/2020, 3:11 PM

## 2020-09-20 NOTE — Progress Notes (Signed)
[facility] night shift.  The patient started coughing and choking after having a small sip.  She became very anxious.  She was given as needed IV hydromorphone, but was still feeling restless (see previous nursing staff notes).  The staff asked me to change her as needed oral lorazepam to IV form to avoid risk of aspiration.  They will hold oral medications until she is evaluated by [facility] rounding physician later today.  Tennis Must, MD.

## 2020-09-22 ENCOUNTER — Ambulatory Visit: Payer: Self-pay | Admitting: Cardiology

## 2020-09-22 LAB — PATHOLOGIST SMEAR REVIEW

## 2020-09-24 ENCOUNTER — Ambulatory Visit: Payer: Self-pay | Admitting: Cardiology

## 2020-10-03 DIAGNOSIS — R062 Wheezing: Secondary | ICD-10-CM | POA: Diagnosis not present

## 2020-10-04 NOTE — Addendum Note (Signed)
Encounter addended by: Annie Paras on: 10/04/2020 1:31 PM  Actions taken: Letter saved

## 2020-10-19 DEATH — deceased
